# Patient Record
Sex: Female | Born: 1945 | Race: White | Hispanic: No | Marital: Married | Smoking: Never smoker
Health system: Southern US, Community
[De-identification: ages and names within clinical notes are randomized; demographics above are authoritative.]

## PROBLEM LIST (undated history)

## (undated) DIAGNOSIS — K5730 Diverticulosis of large intestine without perforation or abscess without bleeding: Secondary | ICD-10-CM

## (undated) DIAGNOSIS — R159 Full incontinence of feces: Secondary | ICD-10-CM

## (undated) DIAGNOSIS — I10 Essential (primary) hypertension: Secondary | ICD-10-CM

## (undated) DIAGNOSIS — E785 Hyperlipidemia, unspecified: Secondary | ICD-10-CM

## (undated) DIAGNOSIS — Z860101 Personal history of adenomatous and serrated colon polyps: Secondary | ICD-10-CM

## (undated) DIAGNOSIS — Z8719 Personal history of other diseases of the digestive system: Secondary | ICD-10-CM

## (undated) DIAGNOSIS — E119 Type 2 diabetes mellitus without complications: Secondary | ICD-10-CM

## (undated) DIAGNOSIS — M1990 Unspecified osteoarthritis, unspecified site: Secondary | ICD-10-CM

## (undated) DIAGNOSIS — E079 Disorder of thyroid, unspecified: Secondary | ICD-10-CM

## (undated) DIAGNOSIS — T7840XA Allergy, unspecified, initial encounter: Secondary | ICD-10-CM

## (undated) DIAGNOSIS — H269 Unspecified cataract: Secondary | ICD-10-CM

## (undated) DIAGNOSIS — Z9889 Other specified postprocedural states: Secondary | ICD-10-CM

## (undated) DIAGNOSIS — K219 Gastro-esophageal reflux disease without esophagitis: Secondary | ICD-10-CM

## (undated) DIAGNOSIS — Z9109 Other allergy status, other than to drugs and biological substances: Secondary | ICD-10-CM

## (undated) DIAGNOSIS — Z973 Presence of spectacles and contact lenses: Secondary | ICD-10-CM

## (undated) DIAGNOSIS — Z86010 Personal history of colonic polyps: Secondary | ICD-10-CM

## (undated) DIAGNOSIS — M8580 Other specified disorders of bone density and structure, unspecified site: Secondary | ICD-10-CM

## (undated) DIAGNOSIS — H409 Unspecified glaucoma: Secondary | ICD-10-CM

## (undated) HISTORY — DX: Essential (primary) hypertension: I10

## (undated) HISTORY — DX: Allergy, unspecified, initial encounter: T78.40XA

## (undated) HISTORY — PX: COLONOSCOPY W/ POLYPECTOMY: SHX1380

## (undated) HISTORY — DX: Other specified disorders of bone density and structure, unspecified site: M85.80

## (undated) HISTORY — PX: POLYPECTOMY: SHX149

## (undated) HISTORY — DX: Unspecified osteoarthritis, unspecified site: M19.90

## (undated) HISTORY — DX: Personal history of other diseases of the digestive system: Z87.19

## (undated) HISTORY — PX: TONSILLECTOMY AND ADENOIDECTOMY: SUR1326

## (undated) HISTORY — PX: CATARACT EXTRACTION: SUR2

## (undated) HISTORY — DX: Unspecified cataract: H26.9

## (undated) HISTORY — PX: COLONOSCOPY: SHX174

## (undated) HISTORY — DX: Gastro-esophageal reflux disease without esophagitis: K21.9

## (undated) HISTORY — DX: Hyperlipidemia, unspecified: E78.5

## (undated) HISTORY — DX: Disorder of thyroid, unspecified: E07.9

## (undated) HISTORY — PX: ESOPHAGOGASTRODUODENOSCOPY (EGD) WITH ESOPHAGEAL DILATION: SHX5812

## (undated) HISTORY — DX: Other specified postprocedural states: Z98.890

---

## 1974-04-06 HISTORY — PX: TUBAL LIGATION: SHX77

## 1977-04-06 HISTORY — PX: THYROIDECTOMY: SHX17

## 1981-04-06 HISTORY — PX: VAGINAL HYSTERECTOMY: SUR661

## 1983-04-07 HISTORY — PX: BUNIONECTOMY: SHX129

## 1997-04-06 HISTORY — PX: TRIGGER FINGER RELEASE: SHX641

## 1997-08-13 ENCOUNTER — Other Ambulatory Visit: Admission: RE | Admit: 1997-08-13 | Discharge: 1997-08-13 | Payer: Self-pay | Admitting: *Deleted

## 1998-10-14 ENCOUNTER — Other Ambulatory Visit: Admission: RE | Admit: 1998-10-14 | Discharge: 1998-10-14 | Payer: Self-pay | Admitting: *Deleted

## 1999-02-12 ENCOUNTER — Encounter: Payer: Self-pay | Admitting: Internal Medicine

## 1999-11-11 ENCOUNTER — Other Ambulatory Visit: Admission: RE | Admit: 1999-11-11 | Discharge: 1999-11-11 | Payer: Self-pay | Admitting: *Deleted

## 2000-12-07 ENCOUNTER — Other Ambulatory Visit: Admission: RE | Admit: 2000-12-07 | Discharge: 2000-12-07 | Payer: Self-pay | Admitting: *Deleted

## 2000-12-27 ENCOUNTER — Ambulatory Visit (HOSPITAL_COMMUNITY): Admission: RE | Admit: 2000-12-27 | Discharge: 2000-12-27 | Payer: Self-pay | Admitting: *Deleted

## 2000-12-27 ENCOUNTER — Encounter: Payer: Self-pay | Admitting: *Deleted

## 2001-12-06 ENCOUNTER — Other Ambulatory Visit: Admission: RE | Admit: 2001-12-06 | Discharge: 2001-12-06 | Payer: Self-pay | Admitting: *Deleted

## 2002-01-03 ENCOUNTER — Encounter: Payer: Self-pay | Admitting: *Deleted

## 2002-01-03 ENCOUNTER — Ambulatory Visit (HOSPITAL_COMMUNITY): Admission: RE | Admit: 2002-01-03 | Discharge: 2002-01-03 | Payer: Self-pay | Admitting: *Deleted

## 2002-11-15 ENCOUNTER — Other Ambulatory Visit: Admission: RE | Admit: 2002-11-15 | Discharge: 2002-11-15 | Payer: Self-pay | Admitting: *Deleted

## 2003-01-08 ENCOUNTER — Encounter: Payer: Self-pay | Admitting: *Deleted

## 2003-01-08 ENCOUNTER — Ambulatory Visit (HOSPITAL_COMMUNITY): Admission: RE | Admit: 2003-01-08 | Discharge: 2003-01-08 | Payer: Self-pay | Admitting: *Deleted

## 2003-11-29 ENCOUNTER — Other Ambulatory Visit: Admission: RE | Admit: 2003-11-29 | Discharge: 2003-11-29 | Payer: Self-pay | Admitting: *Deleted

## 2004-01-08 ENCOUNTER — Encounter: Payer: Self-pay | Admitting: Internal Medicine

## 2004-01-18 ENCOUNTER — Ambulatory Visit (HOSPITAL_COMMUNITY): Admission: RE | Admit: 2004-01-18 | Discharge: 2004-01-18 | Payer: Self-pay | Admitting: *Deleted

## 2004-04-04 ENCOUNTER — Ambulatory Visit: Payer: Self-pay | Admitting: Internal Medicine

## 2004-09-09 ENCOUNTER — Ambulatory Visit: Payer: Self-pay | Admitting: Internal Medicine

## 2004-10-14 ENCOUNTER — Ambulatory Visit: Payer: Self-pay | Admitting: Internal Medicine

## 2004-11-13 ENCOUNTER — Ambulatory Visit: Payer: Self-pay | Admitting: Gastroenterology

## 2004-11-25 ENCOUNTER — Ambulatory Visit: Payer: Self-pay | Admitting: Gastroenterology

## 2004-12-01 ENCOUNTER — Ambulatory Visit: Payer: Self-pay | Admitting: Internal Medicine

## 2004-12-16 ENCOUNTER — Ambulatory Visit: Payer: Self-pay | Admitting: Internal Medicine

## 2004-12-16 ENCOUNTER — Other Ambulatory Visit: Admission: RE | Admit: 2004-12-16 | Discharge: 2004-12-16 | Payer: Self-pay | Admitting: *Deleted

## 2005-01-19 ENCOUNTER — Ambulatory Visit (HOSPITAL_COMMUNITY): Admission: RE | Admit: 2005-01-19 | Discharge: 2005-01-19 | Payer: Self-pay | Admitting: *Deleted

## 2005-01-29 ENCOUNTER — Encounter: Admission: RE | Admit: 2005-01-29 | Discharge: 2005-01-29 | Payer: Self-pay | Admitting: *Deleted

## 2005-02-09 ENCOUNTER — Ambulatory Visit: Payer: Self-pay | Admitting: Internal Medicine

## 2005-02-13 ENCOUNTER — Encounter: Payer: Self-pay | Admitting: Internal Medicine

## 2005-02-13 ENCOUNTER — Ambulatory Visit: Payer: Self-pay

## 2005-02-13 HISTORY — PX: CARDIOVASCULAR STRESS TEST: SHX262

## 2005-03-09 ENCOUNTER — Ambulatory Visit: Payer: Self-pay | Admitting: Internal Medicine

## 2005-03-10 ENCOUNTER — Encounter: Payer: Self-pay | Admitting: Internal Medicine

## 2005-03-23 ENCOUNTER — Ambulatory Visit: Payer: Self-pay

## 2005-04-13 ENCOUNTER — Ambulatory Visit: Payer: Self-pay | Admitting: Internal Medicine

## 2005-04-16 ENCOUNTER — Ambulatory Visit: Payer: Self-pay | Admitting: Cardiology

## 2005-05-19 ENCOUNTER — Ambulatory Visit: Payer: Self-pay | Admitting: Cardiology

## 2005-06-24 ENCOUNTER — Ambulatory Visit: Payer: Self-pay | Admitting: Internal Medicine

## 2005-06-29 ENCOUNTER — Ambulatory Visit: Payer: Self-pay | Admitting: Cardiology

## 2005-07-01 ENCOUNTER — Encounter: Admission: RE | Admit: 2005-07-01 | Discharge: 2005-07-01 | Payer: Self-pay | Admitting: Neurology

## 2005-07-08 ENCOUNTER — Ambulatory Visit (HOSPITAL_COMMUNITY): Admission: RE | Admit: 2005-07-08 | Discharge: 2005-07-08 | Payer: Self-pay | Admitting: Interventional Radiology

## 2005-07-13 ENCOUNTER — Ambulatory Visit: Payer: Self-pay | Admitting: Internal Medicine

## 2005-08-10 ENCOUNTER — Ambulatory Visit: Payer: Self-pay | Admitting: Internal Medicine

## 2005-08-27 ENCOUNTER — Ambulatory Visit: Payer: Self-pay | Admitting: Cardiology

## 2005-11-11 ENCOUNTER — Ambulatory Visit: Payer: Self-pay | Admitting: Internal Medicine

## 2005-12-24 ENCOUNTER — Other Ambulatory Visit: Admission: RE | Admit: 2005-12-24 | Discharge: 2005-12-24 | Payer: Self-pay | Admitting: *Deleted

## 2006-01-26 ENCOUNTER — Ambulatory Visit (HOSPITAL_COMMUNITY): Admission: RE | Admit: 2006-01-26 | Discharge: 2006-01-26 | Payer: Self-pay | Admitting: *Deleted

## 2006-04-06 HISTORY — PX: LAPAROSCOPIC NISSEN FUNDOPLICATION: SHX1932

## 2006-04-06 HISTORY — PX: RETINAL DETACHMENT SURGERY: SHX105

## 2006-04-06 LAB — CONVERTED CEMR LAB

## 2006-07-06 ENCOUNTER — Ambulatory Visit: Payer: Self-pay | Admitting: Internal Medicine

## 2006-07-06 LAB — CONVERTED CEMR LAB
ALT: 21 units/L (ref 0–40)
AST: 23 units/L (ref 0–37)
Albumin: 3.5 g/dL (ref 3.5–5.2)
Alkaline Phosphatase: 39 units/L (ref 39–117)
BUN: 13 mg/dL (ref 6–23)
Basophils Absolute: 0 10*3/uL (ref 0.0–0.1)
Basophils Relative: 0.5 % (ref 0.0–1.0)
Bilirubin, Direct: 0.1 mg/dL (ref 0.0–0.3)
CO2: 33 meq/L — ABNORMAL HIGH (ref 19–32)
Calcium: 9.3 mg/dL (ref 8.4–10.5)
Chloride: 97 meq/L (ref 96–112)
Cholesterol: 204 mg/dL (ref 0–200)
Creatinine, Ser: 0.7 mg/dL (ref 0.4–1.2)
Direct LDL: 89 mg/dL
Eosinophils Absolute: 0.1 10*3/uL (ref 0.0–0.6)
Eosinophils Relative: 1.7 % (ref 0.0–5.0)
GFR calc Af Amer: 109 mL/min
GFR calc non Af Amer: 90 mL/min
Glucose, Bld: 111 mg/dL — ABNORMAL HIGH (ref 70–99)
HCT: 37.7 % (ref 36.0–46.0)
HDL: 51.8 mg/dL (ref >39.0)
Hemoglobin: 13.4 g/dL (ref 12.0–15.0)
Lymphocytes Relative: 34.3 % (ref 12.0–46.0)
MCHC: 35.6 g/dL (ref 30.0–36.0)
MCV: 87 fL (ref 78.0–100.0)
Monocytes Absolute: 0.5 10*3/uL (ref 0.2–0.7)
Monocytes Relative: 7.9 % (ref 3.0–11.0)
Neutro Abs: 3.6 10*3/uL (ref 1.4–7.7)
Neutrophils Relative %: 55.6 % (ref 43.0–77.0)
Platelets: 350 10*3/uL (ref 150–400)
Potassium: 4.2 meq/L (ref 3.5–5.1)
RBC: 4.33 M/uL (ref 3.87–5.11)
RDW: 12 % (ref 11.5–14.6)
Sodium: 138 meq/L (ref 135–145)
TSH: 4.79 microintl units/mL (ref 0.35–5.50)
Total Bilirubin: 0.5 mg/dL (ref 0.3–1.2)
Total CHOL/HDL Ratio: 3.9
Total Protein: 6.8 g/dL (ref 6.0–8.3)
Triglycerides: 412 mg/dL (ref 0–149)
VLDL: 82 mg/dL — ABNORMAL HIGH (ref 0–40)
WBC: 6.4 10*3/uL (ref 4.5–10.5)

## 2006-07-14 ENCOUNTER — Ambulatory Visit: Payer: Self-pay | Admitting: Internal Medicine

## 2006-08-05 ENCOUNTER — Ambulatory Visit (HOSPITAL_BASED_OUTPATIENT_CLINIC_OR_DEPARTMENT_OTHER): Admission: RE | Admit: 2006-08-05 | Discharge: 2006-08-05 | Payer: Self-pay | Admitting: Orthopedic Surgery

## 2006-08-05 HISTORY — PX: CARPAL TUNNEL RELEASE: SHX101

## 2006-08-11 ENCOUNTER — Ambulatory Visit: Payer: Self-pay | Admitting: Family Medicine

## 2006-10-12 ENCOUNTER — Ambulatory Visit: Payer: Self-pay | Admitting: Internal Medicine

## 2006-10-12 LAB — CONVERTED CEMR LAB
BUN: 9 mg/dL (ref 6–23)
CO2: 33 meq/L — ABNORMAL HIGH (ref 19–32)
Calcium: 9.3 mg/dL (ref 8.4–10.5)
Chloride: 106 meq/L (ref 96–112)
Cholesterol: 211 mg/dL (ref 0–200)
Creatinine, Ser: 0.6 mg/dL (ref 0.4–1.2)
Direct LDL: 133.6 mg/dL
GFR calc Af Amer: 131 mL/min
GFR calc non Af Amer: 108 mL/min
Glucose, Bld: 97 mg/dL (ref 70–99)
HDL: 66.1 mg/dL (ref >39.0)
Hgb A1c MFr Bld: 6.3 % — ABNORMAL HIGH (ref 4.6–6.0)
Potassium: 4.1 meq/L (ref 3.5–5.1)
Sodium: 143 meq/L (ref 135–145)
Total CHOL/HDL Ratio: 3.2
Triglycerides: 136 mg/dL (ref 0–149)
VLDL: 27 mg/dL (ref 0–40)

## 2006-10-15 ENCOUNTER — Ambulatory Visit: Payer: Self-pay | Admitting: Internal Medicine

## 2006-10-19 DIAGNOSIS — E785 Hyperlipidemia, unspecified: Secondary | ICD-10-CM | POA: Insufficient documentation

## 2006-10-19 DIAGNOSIS — I10 Essential (primary) hypertension: Secondary | ICD-10-CM | POA: Insufficient documentation

## 2006-10-19 DIAGNOSIS — E119 Type 2 diabetes mellitus without complications: Secondary | ICD-10-CM

## 2007-01-05 LAB — CONVERTED CEMR LAB: Pap Smear: NORMAL

## 2007-01-11 ENCOUNTER — Other Ambulatory Visit: Admission: RE | Admit: 2007-01-11 | Discharge: 2007-01-11 | Payer: Self-pay | Admitting: *Deleted

## 2007-01-28 ENCOUNTER — Ambulatory Visit (HOSPITAL_COMMUNITY): Admission: RE | Admit: 2007-01-28 | Discharge: 2007-01-28 | Payer: Self-pay | Admitting: *Deleted

## 2007-03-11 ENCOUNTER — Encounter: Payer: Self-pay | Admitting: Internal Medicine

## 2007-03-11 ENCOUNTER — Ambulatory Visit: Payer: Self-pay | Admitting: Internal Medicine

## 2007-03-22 ENCOUNTER — Telehealth: Payer: Self-pay | Admitting: Internal Medicine

## 2007-03-25 ENCOUNTER — Ambulatory Visit: Payer: Self-pay

## 2007-03-25 ENCOUNTER — Encounter: Payer: Self-pay | Admitting: Internal Medicine

## 2007-04-11 ENCOUNTER — Ambulatory Visit: Payer: Self-pay | Admitting: Internal Medicine

## 2007-04-11 LAB — CONVERTED CEMR LAB
Glucose, Bld: 109 mg/dL — ABNORMAL HIGH (ref 70–99)
Hgb A1c MFr Bld: 6.1 % — ABNORMAL HIGH (ref 4.6–6.0)

## 2007-04-22 ENCOUNTER — Ambulatory Visit: Payer: Self-pay | Admitting: Internal Medicine

## 2007-04-22 DIAGNOSIS — M8580 Other specified disorders of bone density and structure, unspecified site: Secondary | ICD-10-CM

## 2007-05-30 ENCOUNTER — Telehealth: Payer: Self-pay | Admitting: Internal Medicine

## 2007-08-04 ENCOUNTER — Telehealth (INDEPENDENT_AMBULATORY_CARE_PROVIDER_SITE_OTHER): Payer: Self-pay | Admitting: *Deleted

## 2007-08-10 ENCOUNTER — Encounter: Payer: Self-pay | Admitting: Internal Medicine

## 2007-08-17 ENCOUNTER — Ambulatory Visit: Payer: Self-pay | Admitting: Internal Medicine

## 2007-09-20 ENCOUNTER — Ambulatory Visit: Payer: Self-pay | Admitting: Internal Medicine

## 2007-09-21 ENCOUNTER — Encounter: Payer: Self-pay | Admitting: Internal Medicine

## 2007-09-21 LAB — CONVERTED CEMR LAB
Trich, Wet Prep: NONE SEEN
Yeast Wet Prep HPF POC: NONE SEEN

## 2007-10-21 ENCOUNTER — Ambulatory Visit: Payer: Self-pay | Admitting: Internal Medicine

## 2007-10-21 LAB — CONVERTED CEMR LAB
AST: 19 units/L (ref 0–37)
Alkaline Phosphatase: 35 units/L — ABNORMAL LOW (ref 39–117)
Chloride: 102 meq/L (ref 96–112)
GFR calc non Af Amer: 90 mL/min
Hgb A1c MFr Bld: 6.3 % — ABNORMAL HIGH (ref 4.6–6.0)
LDL Cholesterol: 81 mg/dL (ref 0–99)
Potassium: 3.8 meq/L (ref 3.5–5.1)
Total Bilirubin: 0.6 mg/dL (ref 0.3–1.2)
Total CHOL/HDL Ratio: 3.5

## 2007-11-04 ENCOUNTER — Ambulatory Visit: Payer: Self-pay | Admitting: Internal Medicine

## 2007-11-22 ENCOUNTER — Ambulatory Visit (HOSPITAL_BASED_OUTPATIENT_CLINIC_OR_DEPARTMENT_OTHER): Admission: RE | Admit: 2007-11-22 | Discharge: 2007-11-22 | Payer: Self-pay | Admitting: Family Medicine

## 2007-11-23 ENCOUNTER — Inpatient Hospital Stay (HOSPITAL_COMMUNITY): Admission: EM | Admit: 2007-11-23 | Discharge: 2007-11-25 | Payer: Self-pay | Admitting: Emergency Medicine

## 2007-11-23 ENCOUNTER — Ambulatory Visit: Payer: Self-pay | Admitting: Internal Medicine

## 2007-11-26 ENCOUNTER — Telehealth: Payer: Self-pay | Admitting: Family Medicine

## 2007-12-01 ENCOUNTER — Ambulatory Visit: Payer: Self-pay | Admitting: Internal Medicine

## 2007-12-06 ENCOUNTER — Telehealth: Payer: Self-pay | Admitting: Internal Medicine

## 2008-02-14 ENCOUNTER — Ambulatory Visit (HOSPITAL_COMMUNITY): Admission: RE | Admit: 2008-02-14 | Discharge: 2008-02-14 | Payer: Self-pay | Admitting: Internal Medicine

## 2008-04-13 ENCOUNTER — Ambulatory Visit: Payer: Self-pay | Admitting: Internal Medicine

## 2008-04-16 LAB — CONVERTED CEMR LAB
AST: 36 units/L (ref 0–37)
Calcium: 9.7 mg/dL (ref 8.4–10.5)
Creatinine, Ser: 0.6 mg/dL (ref 0.4–1.2)
GFR calc Af Amer: 130 mL/min
Glucose, Bld: 141 mg/dL — ABNORMAL HIGH (ref 70–99)
HDL: 51.1 mg/dL (ref >39.0)
Sodium: 144 meq/L (ref 135–145)
Total CHOL/HDL Ratio: 4.3
Triglycerides: 336 mg/dL (ref 0–149)

## 2008-10-16 ENCOUNTER — Ambulatory Visit: Payer: Self-pay | Admitting: Internal Medicine

## 2009-02-14 ENCOUNTER — Ambulatory Visit (HOSPITAL_COMMUNITY): Admission: RE | Admit: 2009-02-14 | Discharge: 2009-02-14 | Payer: Self-pay | Admitting: Internal Medicine

## 2009-04-18 ENCOUNTER — Ambulatory Visit: Payer: Self-pay | Admitting: Internal Medicine

## 2009-04-19 LAB — CONVERTED CEMR LAB
AST: 24 units/L (ref 0–37)
CO2: 30 meq/L (ref 19–32)
Chloride: 98 meq/L (ref 96–112)
Creatinine, Ser: 0.6 mg/dL (ref 0.4–1.2)
Direct LDL: 95.5 mg/dL
Hgb A1c MFr Bld: 6.2 % (ref 4.6–6.5)
Potassium: 3.5 meq/L (ref 3.5–5.1)
Sodium: 136 meq/L (ref 135–145)
TSH: 2.79 microintl units/mL (ref 0.35–5.50)
Total Bilirubin: 0.6 mg/dL (ref 0.3–1.2)
Total CHOL/HDL Ratio: 4
Triglycerides: 209 mg/dL — ABNORMAL HIGH (ref 0.0–149.0)
VLDL: 41.8 mg/dL — ABNORMAL HIGH (ref 0.0–40.0)

## 2009-07-02 ENCOUNTER — Encounter: Payer: Self-pay | Admitting: Internal Medicine

## 2009-07-02 ENCOUNTER — Ambulatory Visit: Payer: Self-pay | Admitting: Internal Medicine

## 2009-07-02 LAB — HM DEXA SCAN

## 2009-11-06 ENCOUNTER — Encounter (INDEPENDENT_AMBULATORY_CARE_PROVIDER_SITE_OTHER): Payer: Self-pay | Admitting: *Deleted

## 2009-12-17 ENCOUNTER — Ambulatory Visit: Payer: Self-pay | Admitting: Internal Medicine

## 2009-12-18 LAB — CONVERTED CEMR LAB
Chloride: 99 meq/L (ref 96–112)
Cholesterol: 182 mg/dL (ref 0–200)
GFR calc non Af Amer: 104.61 mL/min (ref >60)
Hgb A1c MFr Bld: 6.7 % — ABNORMAL HIGH (ref 4.6–6.5)
Potassium: 4.3 meq/L (ref 3.5–5.1)
Sodium: 142 meq/L (ref 135–145)
Total CHOL/HDL Ratio: 4
Triglycerides: 255 mg/dL — ABNORMAL HIGH (ref 0.0–149.0)
VLDL: 51 mg/dL — ABNORMAL HIGH (ref 0.0–40.0)

## 2009-12-30 ENCOUNTER — Encounter: Payer: Self-pay | Admitting: Gastroenterology

## 2010-01-01 ENCOUNTER — Telehealth: Payer: Self-pay | Admitting: Gastroenterology

## 2010-01-29 ENCOUNTER — Encounter (INDEPENDENT_AMBULATORY_CARE_PROVIDER_SITE_OTHER): Payer: Self-pay | Admitting: *Deleted

## 2010-01-30 ENCOUNTER — Ambulatory Visit: Payer: Self-pay | Admitting: Gastroenterology

## 2010-02-13 ENCOUNTER — Ambulatory Visit: Payer: Self-pay | Admitting: Gastroenterology

## 2010-02-23 ENCOUNTER — Encounter: Payer: Self-pay | Admitting: Gastroenterology

## 2010-03-06 ENCOUNTER — Ambulatory Visit (HOSPITAL_COMMUNITY): Admission: RE | Admit: 2010-03-06 | Discharge: 2010-03-06 | Payer: Self-pay | Admitting: Internal Medicine

## 2010-04-02 ENCOUNTER — Ambulatory Visit
Admission: RE | Admit: 2010-04-02 | Discharge: 2010-04-02 | Payer: Self-pay | Source: Home / Self Care | Attending: Family Medicine | Admitting: Family Medicine

## 2010-04-02 DIAGNOSIS — M79609 Pain in unspecified limb: Secondary | ICD-10-CM

## 2010-04-09 ENCOUNTER — Encounter
Admission: RE | Admit: 2010-04-09 | Discharge: 2010-04-09 | Payer: Self-pay | Source: Home / Self Care | Attending: Family Medicine | Admitting: Family Medicine

## 2010-04-10 DIAGNOSIS — M542 Cervicalgia: Secondary | ICD-10-CM | POA: Insufficient documentation

## 2010-04-11 ENCOUNTER — Telehealth: Payer: Self-pay | Admitting: Family Medicine

## 2010-04-28 ENCOUNTER — Ambulatory Visit
Admission: RE | Admit: 2010-04-28 | Discharge: 2010-04-28 | Payer: Self-pay | Source: Home / Self Care | Attending: Internal Medicine | Admitting: Internal Medicine

## 2010-05-08 NOTE — Progress Notes (Signed)
Summary: Hold PT for now, OV to discuss MRI requested  Phone Note Call from Patient Call back at Home Phone [phone]   Caller: Patient Call For: Dr. Caryl Never Summary of Call: Pt is requesting a call from Harriett Sine re: your conversation yesterday. Initial call taken by: Lynann Beaver CMA AAMA,  April 11, 2010 9:31 AM  Follow-up for Phone Call        Pt has changed her mind regarding Physical Therapy and would like OV with Dr Cato Mulligan to discuss her MRI.  Also, if he wants her to have PT, she no longer needs it to be done in [facility].  Pt requesting OV with Dr Cato Mulligan be scheduled.  I will inform [facility] Physical Therapy referral be put on hold. Follow-up by: Sid Falcon [name],  April 11, 2010 9:52 AM  Additional Follow-up for Phone Call Additional follow up Details #1::        Pt scheduled with Dr Cato Mulligan 1/23.  Terri will hold PT order Additional Follow-up by: Sid Falcon [name],  April 11, 2010 10:53 AM

## 2010-05-08 NOTE — Letter (Signed)
Summary: Pre Visit Letter Revised  [facility]  [address] [place], [place] [address]   Phone: [phone]  Fax: [phone]        12/30/2009 MRN: [id number] [name] [address] [name], [place]  [address]             Procedure Date:  11-10 at 9:30am   Welcome to the Gastroenterology Division at [facility].    You are scheduled to see a nurse for your pre-procedure visit on 01-30-10 at 10:30am on the 3rd floor at [facility], [address]  We ask that you try to arrive at our office 15 minutes prior to your appointment time to allow for check-in.  Please take a minute to review the attached form.  If you answer "Yes" to one or more of the questions on the first page, we ask that you call the person listed at your earliest opportunity.  If you answer "No" to all of the questions, please complete the rest of the form and bring it to your appointment.    Your nurse visit will consist of discussing your medical and surgical history, your immediate family medical history, and your medications.   If you are unable to list all of your medications on the form, please bring the medication bottles to your appointment and we will list them.  We will need to be aware of both prescribed and over the counter drugs.  We will need to know exact dosage information as well.    Please be prepared to read and sign documents such as consent forms, a financial agreement, and acknowledgement forms.  If necessary, and with your consent, a friend or relative is welcome to sit-in on the nurse visit with you.  Please bring your insurance card so that we may make a copy of it.  If your insurance requires a referral to see a specialist, please bring your referral form from your primary care physician.  No co-pay is required for this nurse visit.     If you cannot keep your appointment, please call [phone] to cancel or reschedule prior to your appointment  date.  This allows [place] the opportunity to schedule an appointment for another patient in need of care.    Thank you for choosing  Gastroenterology for your medical needs.  We appreciate the opportunity to care for you.  Please visit [place] at our website  to learn more about our practice.  Sincerely, The Gastroenterology Division

## 2010-05-08 NOTE — Procedures (Signed)
Summary: Colonoscopy/[facility]  Colonoscopy/ Endoscopy Ctr   Imported By: Sherian Rein 01/06/2010 07:45:49  _____________________________________________________________________  External Attachment:    Type:   Image     Comment:   External Document

## 2010-05-08 NOTE — Assessment & Plan Note (Signed)
Summary: 6 MO ROV/MM   Vital Signs:  Patient profile:   65 year old female Weight:      137 pounds Temp:     97.5 degrees F Pulse rate:   80 / minute Resp:     12 per minute BP sitting:   116 / 68  (left arm)  Vitals Entered By: Gladis Riffle, RN (April 18, 2009 9:48 AM)   History of Present Illness:  Follow-Up Visit      This is a 65 year old woman who presents for Follow-up visit.  The patient denies chest pain, palpitations, dizziness, syncope, edema, SOB, DOE, PND, and orthopnea.  Since the last visit the patient notes no new problems or concerns.  The patient reports taking meds as prescribed and not monitoring BP.  When questioned about possible medication side effects, the patient notes none.    All other systems reviewed and were negative   Preventive Screening-Counseling & Management  Alcohol-Tobacco     Smoking Status: never  Current Problems (verified): 1)  Preventive Health Care  (ICD-V70.0) 2)  Osteopenia  (ICD-733.90) 3)  Hyperglycemia  (ICD-790.29) 4)  Hypertension  (ICD-401.9) 5)  Hyperlipidemia  (ICD-272.4)  Current Medications (verified): 1)  Fosamax 70 Mg Tabs (Alendronate Sodium) .... Take 1 Tablet By Mouth Once A Week 2)  Astelin 137 Mcg/spray  Soln (Azelastine Hcl) .... 2 Sprays Twice Daily 3)  Flonase 50 Mcg/act  Susp (Fluticasone Propionate) .... 2 Sprays Once Daily 4)  Alphagan P 0.1 %  Soln (Brimonidine Tartrate) .... One Drop Each Eye Twice Daily 5)  Diovan Hct 80-12.5 Mg Tabs (Valsartan-Hydrochlorothiazide) .... Take 1 Tab Daily or As Directed 6)  Aspirin 81 Mg  Tbec (Aspirin) .... Once Daily 7)  Multivitamin/iron   Tabs (Multiple Vitamins-Iron) .... Once Daily 8)  D 1000 1000 Unit  Caps (Cholecalciferol) .... Once Daily 9)  Calcium 1200 1200-1000 Mg-Unit  Chew (Calcium Carbonate-Vit D-Min) .... Once Daily 10)  Levonbubolol 0.25% Soln .... One Drop Each Eye Once Daily 11)  Claritin 10 Mg Tabs (Loratadine) .... Once Daily 12)  Align  Caps (Misc  Intestinal Flora Regulat) .... Once Daily 13)  Premarin 0.45 Mg Tabs (Estrogens Conjugated) .... Take 1 Tablet By Mouth Once A Day or As Directed 14)  Restasis 0.05 % Emul (Cyclosporine) .... One Drop Each Eye Two Times A Day  Allergies: 1)  ! Lisinopril  Comments:  Nurse/Medical Assistant: 6 month rov, does not check BP regularly, but is ok when has been checked at home  The patient's medications and allergies were reviewed with the patient and were updated in the Medication and Allergy Lists. Gladis Riffle, RN (April 18, 2009 9:49 AM)  Past History:  Past Medical History: Last updated: 04/22/2007 Allergic rhinitis Hyperlipidemia Hypertension glaucoma increased glucose Osteopenia  Past Surgical History: Last updated: 04/13/2008 Hysterectomy--fibroid Tubal ligation, bilateral 1976 Thyroidectomy, hemi Tonsillectomy, adenoidectomy bilateral  bunionectomy       1985 trigger finger release   1999 glaucoma CTS release-2008 detached retina 2008  Family History: Last updated: 11/04/2007 mother-diabetes, CABG, MI, Strokes Family History Diabetes 1st degree relative-mother Father---liver CA  Social History: Last updated: 11/04/2007 Married Regular exercise-no retired (lay off) Never Smoked Alcohol use-no  Risk Factors: Exercise: no (04/22/2007)  Risk Factors: Smoking Status: never (04/18/2009)  Physical Exam  General:  Well-developed,well-nourished,in no acute distress; alert,appropriate and cooperative throughout examination Head:  normocephalic and atraumatic.   Eyes:  pupils equal and pupils round.   Ears:  R ear normal  and L ear normal.   Neck:  No deformities, masses, or tenderness noted. Chest Wall:  No deformities, masses, or tenderness noted. Lungs:  normal respiratory effort, no intercostal retractions, no dullness, and no fremitus.   Heart:  regular rhythm and no murmur.   Abdomen:  Bowel sounds positive,abdomen soft and non-tender without masses,  organomegaly or hernias noted. Msk:  No deformity or scoliosis noted of thoracic or lumbar spine.   Neurologic:  cranial nerves II-XII intact and gait normal.     Impression & Recommendations:  Problem # 1:  HYPERTENSION (ICD-401.9) controlled continue current medications  Her updated medication list for this problem includes:    Diovan Hct 80-12.5 Mg Tabs (Valsartan-hydrochlorothiazide) .Marland Kitchen... Take 1 tab daily or as directed  Orders: Venipuncture (40981) TLB-BMP (Basic Metabolic Panel-BMET) (80048-METABOL)  BP today: 116/68 Prior BP: 114/66 (10/16/2008)  Labs Reviewed: K+: 4.1 (04/13/2008) Creat: : 0.6 (04/13/2008)   Chol: 219 (04/13/2008)   HDL: 51.1 (04/13/2008)   LDL: DEL (04/13/2008)   TG: 336 (04/13/2008)  Problem # 2:  HYPERGLYCEMIA (ICD-790.29) check lagx today Labs Reviewed: Creat: 0.6 (04/13/2008)     Orders: Venipuncture (19147) TLB-A1C / Hgb A1C (Glycohemoglobin) (83036-A1C)  Problem # 3:  HYPERLIPIDEMIA (ICD-272.4) check labs today Orders: Venipuncture (82956) TLB-Lipid Panel (80061-LIPID) TLB-Hepatic/Liver Function Pnl (80076-HEPATIC) TLB-TSH (Thyroid Stimulating Hormone) (84443-TSH)  Labs Reviewed: SGOT: 36 (04/13/2008)   SGPT: 37 (04/13/2008)   HDL:51.1 (04/13/2008), 46.4 (10/21/2007)  LDL:DEL (04/13/2008), 81 (21/30/8657)  Chol:219 (04/13/2008), 161 (10/21/2007)  Trig:336 (04/13/2008), 168 (10/21/2007)  Problem # 4:  OSTEOPENIA (ICD-733.90) stop fosomax---reviewed dexa 2008 The following medications were removed from the medication list:    Fosamax 70 Mg Tabs (Alendronate sodium) .Marland Kitchen... Take 1 tablet by mouth once a week  Complete Medication List: 1)  Astelin 137 Mcg/spray Soln (Azelastine hcl) .... 2 sprays twice daily 2)  Flonase 50 Mcg/act Susp (Fluticasone propionate) .... 2 sprays once daily 3)  Alphagan P 0.1 % Soln (Brimonidine tartrate) .... One drop each eye twice daily 4)  Diovan Hct 80-12.5 Mg Tabs (Valsartan-hydrochlorothiazide) ....  Take 1 tab daily or as directed 5)  Aspirin 81 Mg Tbec (Aspirin) .... Once daily 6)  Multivitamin/iron Tabs (Multiple vitamins-iron) .... Once daily 7)  D 1000 1000 Unit Caps (Cholecalciferol) .... Once daily 8)  Calcium 1200 1200-1000 Mg-unit Chew (Calcium carbonate-vit d-min) .... Once daily 9)  Levonbubolol 0.25% Soln  .... One drop each eye once daily 10)  Claritin 10 Mg Tabs (Loratadine) .... Once daily 11)  Align Caps (Misc intestinal flora regulat) .... Once daily 12)  Premarin 0.45 Mg Tabs (Estrogens conjugated) .... Take 1 tablet by mouth once a day or as directed 13)  Restasis 0.05 % Emul (Cyclosporine) .... One drop each eye two times a day  Patient Instructions: 1)  Please schedule a follow-up appointment in 6 months.

## 2010-05-08 NOTE — Procedures (Signed)
Summary: Colonoscopy  Patient: [name] Note: All result statuses are Final unless otherwise noted.  Tests: (1) Colonoscopy (COL)   COL Colonoscopy           DONE     [facility]     [address]     [place], [place]  [address]           COLONOSCOPY PROCEDURE REPORT           PATIENT:  [name], [name]  MR#:  [id number]     BIRTHDATE:  May 13, 1945, 64 yrs. old  GENDER:  female           ENDOSCOPIST:  Barbette Hair. Arlyce Dice, MD     Referred by:           PROCEDURE DATE:  02/13/2010     PROCEDURE:  Colonoscopy with snare polypectomy     ASA CLASS:  Class II     INDICATIONS:  1) Elevated Risk Screening  2) family history of     colon cancer Father           MEDICATIONS:   Fentanyl 50 mcg IV, Versed 6 mg IV           DESCRIPTION OF PROCEDURE:   After the risks benefits and     alternatives of the procedure were thoroughly explained, informed     consent was obtained.  Digital rectal exam was performed and     revealed no abnormalities.   The LB 180AL [id number] endoscope was     introduced through the anus and advanced to the cecum, which was     identified by both the appendix and ileocecal valve, without     limitations.  The quality of the prep was excellent, using     MoviPrep.  The instrument was then slowly withdrawn as the colon     was fully examined.     <<PROCEDUREIMAGES>>           FINDINGS:  A sessile polyp was found in the ascending colon. It     was 4 mm in size. Polyp was snared without cautery. Retrieval was     successful (see image5). snare polyp  Mild diverticulosis was     found in the sigmoid colon (see image12).  Internal hemorrhoids     were found (see image14).  This was otherwise a normal examination     of the colon (see image2, image4, image6, image8, and image13).     Retroflexed views in the rectum revealed internal hemorrhoids.     The time to cecum =  3.0  minutes. The scope was then withdrawn (time     =  7.0  min) from the  patient and the procedure completed.           COMPLICATIONS:  None           ENDOSCOPIC IMPRESSION:     1) 4 mm sessile polyp in the ascending colon     2) Mild diverticulosis in the sigmoid colon     3) Otherwise normal examination     4) Internal hemorrhoids     RECOMMENDATIONS:     1) Given your significant family history of colon cancer, you     should have a repeat colonoscopy in 5 years           REPEAT EXAM:   5 year(s) Colonoscopy           ______________________________  Barbette Hair. Arlyce Dice, MD           CC: Lindley Magnus, MD           n.     Rosalie DoctorBarbette Hair. Kaplan at 02/13/2010 10:05 AM           Tyson Alias, [id number]  Note: An exclamation mark (!) indicates a result that was not dispersed into the flowsheet. Document Creation Date: 02/13/2010 10:06 AM _______________________________________________________________________  (1) Order result status: Final Collection or observation date-time: 02/13/2010 09:59 Requested date-time:  Receipt date-time:  Reported date-time:  Referring Physician:   Ordering Physician: Melvia Heaps [phone]) Specimen Source:  Source: Launa Grill Order Number: [id number] Lab site:   Appended Document: Colonoscopy     Procedures Next Due Date:    Colonoscopy: 02/2015

## 2010-05-08 NOTE — Assessment & Plan Note (Signed)
Summary: left arm pain//ccm   Vital Signs:  Patient profile:   65 year old female Weight:      133 pounds Temp:     97.8 degrees F oral BP sitting:   130 / 74  (left arm) Cuff size:   regular  Vitals Entered By: Sid Falcon [name] (April 02, 2010 2:44 PM)  History of Present Illness: L upper extremity pain for 2 months.  Neck radiating to hand. Achy pain which comes and goes.  Progressive. Worse with direct pressure on arm.  Worse with stretching out arm ? weaker with hand grip.  No injury.   No alleviating factors.  Severity of pain is 4-5/10.   Remote hx of carpal tunnel syndrome on L side. this pain is different. Hx of osteopenia.  Allergies: 1)  ! Lisinopril  Past History:  Past Medical History: Last updated: 04/22/2007 Allergic rhinitis Hyperlipidemia Hypertension glaucoma increased glucose Osteopenia  Past Surgical History: Last updated: 04/13/2008 Hysterectomy--fibroid Tubal ligation, bilateral 1976 Thyroidectomy, hemi Tonsillectomy, adenoidectomy bilateral  bunionectomy       1985 trigger finger release   1999 glaucoma CTS release-2008 detached retina 2008  Family History: Last updated: 11/04/2007 mother-diabetes, CABG, MI, Strokes Family History Diabetes 1st degree relative-mother Father---liver CA  Social History: Last updated: 11/04/2007 Married Regular exercise-no retired (lay off) Never Smoked Alcohol use-no  Risk Factors: Exercise: no (04/22/2007)  Risk Factors: Smoking Status: never (04/18/2009) PMH-FH-SH reviewed for relevance  Review of Systems  The patient denies anorexia, fever, weight loss, hoarseness, chest pain, peripheral edema, headaches, muscle weakness, and enlarged lymph nodes.    Physical Exam  General:  Well-developed,well-nourished,in no acute distress; alert,appropriate and cooperative throughout examination Head:  normocephalic and atraumatic.   Neck:  No deformities, masses, or tenderness noted. Good  ROM. Lungs:  Normal respiratory effort, chest expands symmetrically. Lungs are clear to auscultation, no crackles or wheezes. Heart:  Normal rate and regular rhythm. S1 and S2 normal without gallop, murmur, click, rub or other extra sounds. Extremities:  no atrophy Neurologic:  ?grip weakness L vs R other wise full strenght.sensation intact to light touch and DTRs symmetrical and normal.     Impression & Recommendations:  Problem # 1:  ARM PAIN (ICD-729.5) Assessment New suspect Cervical nerve root impingement.  start with some plain films.  May need MRI to further assess. Orders: T-Cervical Spine Comp 4 Views [phone])  Problem # 2:  OSTEOPENIA (ICD-733.90)  Her updated medication list for this problem includes:    D 1000 1000 Unit Caps (Cholecalciferol) ..... Once daily    Calcium 1200 1200-1000 Mg-unit Chew (Calcium carbonate-vit d-min) ..... Once daily  Complete Medication List: 1)  Alphagan P 0.1 % Soln (Brimonidine tartrate) .... One drop each eye twice daily 2)  Diovan Hct 80-12.5 Mg Tabs (Valsartan-hydrochlorothiazide) .... Take 1 tab daily or as directed 3)  Aspirin 81 Mg Tbec (Aspirin) .... Once daily 4)  Multivitamin/iron Tabs (Multiple vitamins-iron) .... Once daily 5)  D 1000 1000 Unit Caps (Cholecalciferol) .... Once daily 6)  Calcium 1200 1200-1000 Mg-unit Chew (Calcium carbonate-vit d-min) .... Once daily 7)  Levonbubolol 0.25% Soln  .... One drop each eye once daily 8)  Claritin 10 Mg Tabs (Loratadine) .... Once daily 9)  Align Caps (Misc intestinal flora regulat) .... Once daily 10)  Premarin 0.45 Mg Tabs (Estrogens conjugated) .... Take 1 tablet by mouth once a day or as directed 11)  Restasis 0.05 % Emul (Cyclosporine) .... One drop each eye two times a  day 12)  Triamcinolone Acetonide 0.5 % Crea (Triamcinolone acetonide) .... Apply bid to affected area   Orders Added: 1)  T-Cervical Spine Comp 4 Views [72050TC] 2)  Est. Patient Level IV [65784]

## 2010-05-08 NOTE — Miscellaneous (Signed)
Summary: RECALL COLON/YF  Clinical Lists Changes  Medications: Added new medication of MOVIPREP 100 GM  SOLR (PEG-KCL-NACL-NASULF-NA ASC-C) As directed - Signed Rx of MOVIPREP 100 GM  SOLR (PEG-KCL-NACL-NASULF-NA ASC-C) As directed;  #1 x 0;  Signed;  Entered by: Clide Cliff RN;  Authorized by: Louis Meckel MD;  Method used: [facility]  [address] [phone] [address] [place], [place], [place]  [address], Ph: [phone] or [phone], Fax: [phone]    Prescriptions: MOVIPREP 100 GM  SOLR (PEG-KCL-NACL-NASULF-NA ASC-C) As directed  #1 x 0   Entered by:   Clide Cliff RN   Authorized by:   Louis Meckel MD   Signed by:   Clide Cliff RN on 01/30/2010   Method used:   [facility]ically to        [facility]  [address] [phone] (retail)       [address]       [place], [place]  [address]       Ph: [phone] or [phone]       Fax: [phone]   RxID:   [id number]

## 2010-05-08 NOTE — Progress Notes (Signed)
Summary: Direct referral form answered yes to 2 questions   Phone Note Call from Patient Call back at Home Phone [phone]   Caller: Patient Call For: Dr Arlyce Dice Reason for Call: Talk to Nurse Summary of Call: Patient has questions regarding forms she received before her previsit. Initial call taken by: Tawni Levy,  January 01, 2010 12:02 PM  Follow-up for Phone Call        Dr Arlyce Dice, Pt answered yes to 2 questions on the direct referral form that we are mailing out to the pt. She had a colonoscopy in 2006. by you, I have the report it needs to be scanned in, she had diverticulosis and internal hemorrhoids on her report.  She also answered yes to sedation problems but when I asked her about that she just stated she was just easy to sedate. Can pt continue with her Prevsit and colonoscopy? Or has her recall changed? Let me know and I will contact the pt to let know.  Follow-up by: Merri Ray CMA Duncan Dull),  January 01, 2010 12:15 PM  Additional Follow-up for Phone Call Additional follow up Details #1::        needs colo Additional Follow-up by: Louis Meckel MD,  January 01, 2010 2:31 PM    Additional Follow-up for Phone Call Additional follow up Details #2::    Dr Arlyce Dice reviewed report, wants pt to continue with Previsit and scheduled colonoscopy Called pt to inform Follow-up by: Merri Ray CMA Duncan Dull),  January 01, 2010 2:52 PM

## 2010-05-08 NOTE — Letter (Signed)
Summary: Colonoscopy Letter  [facility]  [address] [place], [place] [address]   Phone: [phone]  Fax: [phone]      November 06, 2009 MRN: [id number]   [name] [address] [place], [place]  [address]   Dear [name],   According to your medical record, it is time for you to schedule a Colonoscopy. The American Cancer Society recommends this procedure as a method to detect early colon cancer. Patients with a family history of colon cancer, or a personal history of colon polyps or inflammatory bowel disease are at increased risk.  This letter has been generated based on the recommendations made at the time of your procedure. If you feel that in your particular situation this may no longer apply, please contact our office.  Please call our office at [phone] to schedule this appointment or to update your records at your earliest convenience.  Thank you for cooperating with [place] to provide you with the very best care possible.   Sincerely,   Barbette Hair. Arlyce Dice, M.D.  [facility] [phone]

## 2010-05-08 NOTE — Letter (Signed)
Summary: Patient Notice- Polyp Results  [facility]  [address] [place], [place] [address]   Phone: [phone]  Fax: [phone]        February 23, 2010 MRN: [id number]    [name] [address] [place], [place]  [address]    Dear Ms. [name],  I am pleased to inform you that the colon polyp(s) removed during your recent colonoscopy was (were) found to be benign (no cancer detected) upon pathologic examination.  I recommend you have a repeat colonoscopy examination in 5_ years to look for recurrent polyps, as having colon polyps increases your risk for having recurrent polyps or even colon cancer in the future.  Should you develop new or worsening symptoms of abdominal pain, bowel habit changes or bleeding from the rectum or bowels, please schedule an evaluation with either your primary care physician or with me.  Additional information/recommendations:  __ No further action with gastroenterology is needed at this time. Please      follow-up with your primary care physician for your other healthcare      needs.  __ Please call [phone] to schedule a return visit to review your      situation.  __ Please keep your follow-up visit as already scheduled.  _x_ Continue treatment plan as outlined the day of your exam.  Please call us if you are having persistent problems or have questions about your condition that have not been fully answered at this time.  Sincerely,  Louis Meckel MD  This letter has been electronically signed by your physician.  Appended Document: Patient Notice- Polyp Results Letter mailed

## 2010-05-08 NOTE — Letter (Signed)
Summary: [facility] Instructions  [facility]  [address] [place], [place] [address]   Phone: [phone]  Fax: [phone]       [name]    Apr 20, 1945    MRN: [id number]        Procedure Day Dorna Bloom:  Lenor Coffin  02/13/10     Arrival Time:  8:30AM     Procedure Time:  9:30AM     Location of Procedure:                    Juliann Pares _  [facility] (4th Floor)  PREPARATION FOR COLONOSCOPY WITH MOVIPREP   Starting 5 days prior to your procedure 02/08/10 do not eat nuts, seeds, popcorn, corn, beans, peas,  salads, or any raw vegetables.  Do not take any fiber supplements (e.g. Metamucil, Citrucel, and Benefiber).  THE DAY BEFORE YOUR PROCEDURE         DATE: 02/12/10  DAY: WEDNESDAY  1.  Drink clear liquids the entire day-NO SOLID FOOD  2.  Do not drink anything colored red or purple.  Avoid juices with pulp.  No orange juice.  3.  Drink at least 64 oz. (8 glasses) of fluid/clear liquids during the day to prevent dehydration and help the prep work efficiently.  CLEAR LIQUIDS INCLUDE: Water Jello Ice Popsicles Tea (sugar ok, no milk/cream) Powdered fruit flavored drinks Coffee (sugar ok, no milk/cream) Gatorade Juice: apple, white grape, white cranberry  Lemonade Clear bullion, consomm, broth Carbonated beverages (any kind) Strained chicken noodle soup Hard Candy                             4.  In the morning, mix first dose of MoviPrep solution:    Empty 1 Pouch A and 1 Pouch B into the disposable container    Add lukewarm drinking water to the top line of the container. Mix to dissolve    Refrigerate (mixed solution should be used within 24 hrs)  5.  Begin drinking the prep at 5:00 p.m. The MoviPrep container is divided by 4 marks.   Every 15 minutes drink the solution down to the next mark (approximately 8 oz) until the full liter is complete.   6.  Follow completed prep with 16 oz of clear liquid of your choice (Nothing red or  purple).  Continue to drink clear liquids until bedtime.  7.  Before going to bed, mix second dose of MoviPrep solution:    Empty 1 Pouch A and 1 Pouch B into the disposable container    Add lukewarm drinking water to the top line of the container. Mix to dissolve    Refrigerate  THE DAY OF YOUR PROCEDURE      DATE: 02/13/10  DAY: THURSDAY  Beginning at 4:30AM (5 hours before procedure):         1. Every 15 minutes, drink the solution down to the next mark (approx 8 oz) until the full liter is complete.  2. Follow completed prep with 16 oz. of clear liquid of your choice.    3. You may drink clear liquids until 7:30AM (2 HOURS BEFORE PROCEDURE).   MEDICATION INSTRUCTIONS  Unless otherwise instructed, you should take regular prescription medications with a small sip of water   as early as possible the morning of your procedure.   Additional medication instructions:   Hold HCTZ AM of procedure  OTHER INSTRUCTIONS  You will need a responsible adult at least 66 years of age to accompany you and drive you home.   This person must remain in the waiting room during your procedure.  Wear loose fitting clothing that is easily removed.  Leave jewelry and other valuables at home.  However, you may wish to bring a book to read or  an iPod/MP3 player to listen to music as you wait for your procedure to start.  Remove all body piercing jewelry and leave at home.  Total time from sign-in until discharge is approximately 2-3 hours.  You should go home directly after your procedure and rest.  You can resume normal activities the  day after your procedure.  The day of your procedure you should not:   Drive   Make legal decisions   Operate machinery   Drink alcohol   Return to work  You will receive specific instructions about eating, activities and medications before you leave.    The above instructions have been reviewed and explained to me by   Clide Cliff,  RN_______________________    I fully understand and can verbalize these instructions _____________________________ Date _________

## 2010-05-08 NOTE — Miscellaneous (Signed)
Summary: BONE DENSITY  Clinical Lists Changes  Orders: Added new Test order of T-Bone Densitometry (77080) - Signed Added new Test order of T-Lumbar Vertebral Assessment (77082) - Signed 

## 2010-05-08 NOTE — Assessment & Plan Note (Signed)
Summary: 6 month follow up/cjr   Vital Signs:  Patient profile:   65 year old female Height:      62.5 inches Weight:      132 pounds BMI:     23.84 Temp:     98.3 degrees F oral BP sitting:   124 / 78  (left arm) Cuff size:   regular  Vitals Entered By: Kern Reap CMA Duncan Dull) (December 17, 2009 11:48 AM) CC: follow-up visit Is Patient Diabetic? No Pain Assessment Patient in pain? no        CC:  follow-up visit.  History of Present Illness:  Follow-Up Visit      This is a 65 year old woman who presents for Follow-up visit.  The patient denies chest pain and palpitations.  Since the last visit the patient notes no new problems or concerns.  The patient reports taking meds as prescribed.  When questioned about possible medication side effects, the patient notes none.    All other systems reviewed and were negative   Current Medications (verified): 1)  Alphagan P 0.1 %  Soln (Brimonidine Tartrate) .... One Drop Each Eye Twice Daily 2)  Diovan Hct 80-12.5 Mg Tabs (Valsartan-Hydrochlorothiazide) .... Take 1 Tab Daily or As Directed 3)  Aspirin 81 Mg  Tbec (Aspirin) .... Once Daily 4)  Multivitamin/iron   Tabs (Multiple Vitamins-Iron) .... Once Daily 5)  D 1000 1000 Unit  Caps (Cholecalciferol) .... Once Daily 6)  Calcium 1200 1200-1000 Mg-Unit  Chew (Calcium Carbonate-Vit D-Min) .... Once Daily 7)  Levonbubolol 0.25% Soln .... One Drop Each Eye Once Daily 8)  Claritin 10 Mg Tabs (Loratadine) .... Once Daily 9)  Align  Caps (Misc Intestinal Flora Regulat) .... Once Daily 10)  Premarin 0.45 Mg Tabs (Estrogens Conjugated) .... Take 1 Tablet By Mouth Once A Day or As Directed 11)  Restasis 0.05 % Emul (Cyclosporine) .... One Drop Each Eye Two Times A Day  Allergies: 1)  ! Lisinopril  Past History:  Past Medical History: Last updated: 04/22/2007 Allergic rhinitis Hyperlipidemia Hypertension glaucoma increased glucose Osteopenia  Past Surgical History: Last  updated: 04/13/2008 Hysterectomy--fibroid Tubal ligation, bilateral 1976 Thyroidectomy, hemi Tonsillectomy, adenoidectomy bilateral  bunionectomy       1985 trigger finger release   1999 glaucoma CTS release-2008 detached retina 2008  Family History: Last updated: 11/04/2007 mother-diabetes, CABG, MI, Strokes Family History Diabetes 1st degree relative-mother Father---liver CA  Social History: Last updated: 11/04/2007 Married Regular exercise-no retired (lay off) Never Smoked Alcohol use-no  Risk Factors: Exercise: no (04/22/2007)  Risk Factors: Smoking Status: never (04/18/2009)  Physical Exam  General:  alert and well-developed.   Eyes:  pupils equal and pupils round.   Ears:  R ear normal and L ear normal.   Neck:  No deformities, masses, or tenderness noted. Chest Wall:  No deformities, masses, or tenderness noted. Lungs:  normal respiratory effort, no intercostal retractions, no dullness, and no fremitus.   Heart:  regular rhythm and no murmur.     Impression & Recommendations:  Problem # 1:  HYPERGLYCEMIA (ICD-790.29) check labs today Orders: Venipuncture (04540) Specimen Handling (98119) TLB-A1C / Hgb A1C (Glycohemoglobin) (83036-A1C)  Problem # 2:  HYPERTENSION (ICD-401.9) controlled continue current medications  Her updated medication list for this problem includes:    Diovan Hct 80-12.5 Mg Tabs (Valsartan-hydrochlorothiazide) .Marland Kitchen... Take 1 tab daily or as directed  BP today: 124/78 Prior BP: 116/68 (04/18/2009)  Labs Reviewed: K+: 3.5 (04/18/2009) Creat: : 0.6 (04/18/2009)   Chol:  170 (04/18/2009)   HDL: 48.30 (04/18/2009)   LDL: DEL (04/13/2008)   TG: 209.0 (04/18/2009)  Orders: Specimen Handling ([address]) TLB-BMP (Basic Metabolic Panel-BMET) (80048-METABOL)  Problem # 3:  HYPERLIPIDEMIA (ICD-272.4)  no meds required (note TG) Labs Reviewed: SGOT: 24 (04/18/2009)   SGPT: 24 (04/18/2009)   HDL:48.30 (04/18/2009), 51.1 (04/13/2008)   LDL:DEL (04/13/2008), 81 (60/45/4098)  Chol:170 (04/18/2009), 219 (04/13/2008)  Trig:209.0 (04/18/2009), 336 (04/13/2008)  Orders: TLB-Lipid Panel (80061-LIPID) TLB-TSH (Thyroid Stimulating Hormone) (84443-TSH)  Complete Medication List: 1)  Alphagan P 0.1 % Soln (Brimonidine tartrate) .... One drop each eye twice daily 2)  Diovan Hct 80-12.5 Mg Tabs (Valsartan-hydrochlorothiazide) .... Take 1 tab daily or as directed 3)  Aspirin 81 Mg Tbec (Aspirin) .... Once daily 4)  Multivitamin/iron Tabs (Multiple vitamins-iron) .... Once daily 5)  D 1000 1000 Unit Caps (Cholecalciferol) .... Once daily 6)  Calcium 1200 1200-1000 Mg-unit Chew (Calcium carbonate-vit d-min) .... Once daily 7)  Levonbubolol 0.25% Soln  .... One drop each eye once daily 8)  Claritin 10 Mg Tabs (Loratadine) .... Once daily 9)  Align Caps (Misc intestinal flora regulat) .... Once daily 10)  Premarin 0.45 Mg Tabs (Estrogens conjugated) .... Take 1 tablet by mouth once a day or as directed 11)  Restasis 0.05 % Emul (Cyclosporine) .... One drop each eye two times a day 12)  Triamcinolone Acetonide 0.5 % Crea (Triamcinolone acetonide) .... Apply bid to affected area Prescriptions: PREMARIN 0.45 MG TABS (ESTROGENS CONJUGATED) Take 1 tablet by mouth once a day or as directed  #30 x 11   Entered and Authorized by:   Birdie Sons MD   Signed by:   Birdie Sons MD on 12/17/2009   Method used:   Electronically to        [facility]  [address] [phone]* (retail)       [address] [place], [place]  [address]       Ph: [phone] or [phone]       Fax: [phone]   RxID:   [id number] DIOVAN HCT 80-12.5 MG TABS (VALSARTAN-HYDROCHLOROTHIAZIDE) Take 1 tab daily or as directed  #30 x 11   Entered and Authorized by:   Birdie Sons MD   Signed by:   Birdie Sons MD on 12/17/2009   Method used:   Electronically to        [facility]  [address] [phone]* (retail)       [address] [place], [place]   [address]       Ph: [phone] or [phone]       Fax: [phone]   RxID:   [id number] TRIAMCINOLONE ACETONIDE 0.5 % CREA (TRIAMCINOLONE ACETONIDE) apply bid to affected area  #60 grams x 1   Entered and Authorized by:   Birdie Sons MD   Signed by:   Birdie Sons MD on 12/17/2009   Method used:   Electronically to        [facility]  [address] [phone]* (retail)       [address] [place], [place]  [address]       Ph: [phone] or [phone]       Fax: [phone]   RxID:   [id number]

## 2010-05-14 NOTE — Assessment & Plan Note (Signed)
Summary: consult to discuss physical therapy/per nancy/cjr   Vital Signs:  Patient profile:   65 year old female Weight:      134 pounds Temp:     97.6 degrees F oral BP sitting:   146 / 82  (left arm) Cuff size:   regular  Vitals Entered By: Alfred Levins, CMA (April 28, 2010 12:49 PM) CC: f/u on lt arm pain   CC:  f/u on lt arm pain.  History of Present Illness: left arm pain typiclly worse with certain movements or lifting  reviewed previous notes. reviewed MRI. no real indication of radiculopathy. She continues to have pain of the arm. mostly of the left shoulder when she is actively moving it.   no other complaints.  Current Medications (verified): 1)  Alphagan P 0.1 %  Soln (Brimonidine Tartrate) .... One Drop Each Eye Twice Daily 2)  Diovan Hct 80-12.5 Mg Tabs (Valsartan-Hydrochlorothiazide) .... Take 1 Tab Daily or As Directed 3)  Aspirin 81 Mg  Tbec (Aspirin) .... Once Daily 4)  Multivitamin/iron   Tabs (Multiple Vitamins-Iron) .... Once Daily 5)  D 1000 1000 Unit  Caps (Cholecalciferol) .... Once Daily 6)  Calcium 1200 1200-1000 Mg-Unit  Chew (Calcium Carbonate-Vit D-Min) .... Once Daily 7)  Levonbubolol 0.25% Soln .... One Drop Each Eye Once Daily 8)  Claritin 10 Mg Tabs (Loratadine) .... Once Daily 9)  Align  Caps (Misc Intestinal Flora Regulat) .... Once Daily 10)  Premarin 0.45 Mg Tabs (Estrogens Conjugated) .... Take 1 Tablet By Mouth Once A Day or As Directed 11)  Restasis 0.05 % Emul (Cyclosporine) .... One Drop Each Eye Two Times A Day 12)  Triamcinolone Acetonide 0.5 % Crea (Triamcinolone Acetonide) .... Apply Bid To Affected Area  Allergies (verified): 1)  ! Lisinopril  Past History:  Past Medical History: Last updated: 04/22/2007 Allergic rhinitis Hyperlipidemia Hypertension glaucoma increased glucose Osteopenia  Past Surgical History: Last updated: 04/13/2008 Hysterectomy--fibroid Tubal ligation, bilateral 1976 Thyroidectomy,  hemi Tonsillectomy, adenoidectomy bilateral  bunionectomy       1985 trigger finger release   1999 glaucoma CTS release-2008 detached retina 2008  Family History: Last updated: 11/04/2007 mother-diabetes, CABG, MI, Strokes Family History Diabetes 1st degree relative-mother Father---liver CA  Social History: Last updated: 11/04/2007 Married Regular exercise-no retired (lay off) Never Smoked Alcohol use-no  Risk Factors: Exercise: no (04/22/2007)  Risk Factors: Smoking Status: never (04/18/2009)  Physical Exam  General:   well-developed female no acute distress. Neck is supple. range of motion of both shoulders but pain with abduction and flexion of her left shoulder. deep tendon reflexes in the upper  extremities are normal period   Impression & Recommendations:  Problem # 1:  ARM PAIN (ICD-729.5) left I think a mechanical problem...could be related to RTC duration now 3-4 onths trial NSAID if no success than PT/?ortho.  trial NSAID---  Complete Medication List: 1)  Alphagan P 0.1 % Soln (Brimonidine tartrate) .... One drop each eye twice daily 2)  Diovan Hct 80-12.5 Mg Tabs (Valsartan-hydrochlorothiazide) .... Take 1 tab daily or as directed 3)  Aspirin 81 Mg Tbec (Aspirin) .... Once daily 4)  Multivitamin/iron Tabs (Multiple vitamins-iron) .... Once daily 5)  D 1000 1000 Unit Caps (Cholecalciferol) .... Once daily 6)  Calcium 1200 1200-1000 Mg-unit Chew (Calcium carbonate-vit d-min) .... Once daily 7)  Levonbubolol 0.25% Soln  .... One drop each eye once daily 8)  Claritin 10 Mg Tabs (Loratadine) .... Once daily 9)  Align Caps (Misc intestinal flora regulat) .Marland KitchenMarland KitchenMarland Kitchen  Once daily 10)  Premarin 0.45 Mg Tabs (Estrogens conjugated) .... Take 1 tablet by mouth once a day or as directed 11)  Restasis 0.05 % Emul (Cyclosporine) .... One drop each eye two times a day 12)  Triamcinolone Acetonide 0.5 % Crea (Triamcinolone acetonide) .... Apply bid to affected area 13)   Meloxicam 15 Mg Tabs (Meloxicam) .... One by mouth daily with food  Patient Instructions: 1)  call me in 2 weeks if not significantly better Prescriptions: MELOXICAM 15 MG TABS (MELOXICAM) one by mouth daily with food  #30 x 0   Entered and Authorized by:   Birdie Sons MD   Signed by:   Birdie Sons MD on 04/28/2010   Method used:   Electronically to        [facility]  [address] [phone]* (retail)       [address] [place], [place]  [address]       Ph: [phone] or [phone]       Fax: [phone]   RxID:   [id number]    Orders Added: 1)  Est. Patient Level III [34742]

## 2010-07-12 ENCOUNTER — Inpatient Hospital Stay (HOSPITAL_COMMUNITY)
Admission: EM | Admit: 2010-07-12 | Discharge: 2010-07-14 | DRG: 392 | Disposition: A | Discharge status: Home / Self Care | Payer: Medicare Other | Attending: Internal Medicine | Admitting: Internal Medicine

## 2010-07-12 ENCOUNTER — Emergency Department (HOSPITAL_COMMUNITY): Payer: Medicare Other

## 2010-07-12 DIAGNOSIS — K449 Diaphragmatic hernia without obstruction or gangrene: Secondary | ICD-10-CM | POA: Diagnosis present

## 2010-07-12 DIAGNOSIS — H409 Unspecified glaucoma: Secondary | ICD-10-CM | POA: Diagnosis present

## 2010-07-12 DIAGNOSIS — I7789 Other specified disorders of arteries and arterioles: Secondary | ICD-10-CM | POA: Diagnosis present

## 2010-07-12 DIAGNOSIS — I10 Essential (primary) hypertension: Secondary | ICD-10-CM | POA: Diagnosis present

## 2010-07-12 DIAGNOSIS — Z7982 Long term (current) use of aspirin: Secondary | ICD-10-CM

## 2010-07-12 DIAGNOSIS — R0789 Other chest pain: Secondary | ICD-10-CM | POA: Diagnosis present

## 2010-07-12 DIAGNOSIS — K219 Gastro-esophageal reflux disease without esophagitis: Principal | ICD-10-CM | POA: Diagnosis present

## 2010-07-12 DIAGNOSIS — K224 Dyskinesia of esophagus: Secondary | ICD-10-CM | POA: Diagnosis present

## 2010-07-12 LAB — POCT CARDIAC MARKERS
CKMB, poc: <1 ng/mL — ABNORMAL LOW (ref 1.0–8.0)
Myoglobin, poc: 45.5 ng/mL (ref 12–200)
Troponin i, poc: <0.05 ng/mL (ref 0.00–0.09)

## 2010-07-12 LAB — DIFFERENTIAL
Basophils Absolute: 0 10*3/uL (ref 0.0–0.1)
Basophils Relative: 0 % (ref 0–1)
Eosinophils Absolute: 0.2 10*3/uL (ref 0.0–0.7)
Lymphs Abs: 2.5 10*3/uL (ref 0.7–4.0)
Neutrophils Relative %: 59 % (ref 43–77)

## 2010-07-12 LAB — BASIC METABOLIC PANEL
CO2: 28 mEq/L (ref 19–32)
Calcium: 8.9 mg/dL (ref 8.4–10.5)
GFR calc Af Amer: >60 mL/min (ref >60)
GFR calc non Af Amer: >60 mL/min (ref >60)
Glucose, Bld: 152 mg/dL — ABNORMAL HIGH (ref 70–99)
Potassium: 3.5 mEq/L (ref 3.5–5.1)
Sodium: 138 mEq/L (ref 135–145)

## 2010-07-12 LAB — CBC
Platelets: 285 10*3/uL (ref 150–400)
RBC: 4.22 MIL/uL (ref 3.87–5.11)
WBC: 8.1 10*3/uL (ref 4.0–10.5)

## 2010-07-13 ENCOUNTER — Emergency Department (HOSPITAL_COMMUNITY): Payer: Medicare Other

## 2010-07-13 ENCOUNTER — Inpatient Hospital Stay (HOSPITAL_COMMUNITY): Payer: Medicare Other

## 2010-07-13 LAB — CBC
HCT: 34.9 % — ABNORMAL LOW (ref 36.0–46.0)
MCH: 29.5 pg (ref 26.0–34.0)
MCV: 87.9 fL (ref 78.0–100.0)
RBC: 3.97 MIL/uL (ref 3.87–5.11)
WBC: 7.9 10*3/uL (ref 4.0–10.5)

## 2010-07-13 LAB — COMPREHENSIVE METABOLIC PANEL
ALT: 17 U/L (ref 0–35)
AST: 20 U/L (ref 0–37)
Albumin: 3.2 g/dL — ABNORMAL LOW (ref 3.5–5.2)
Alkaline Phosphatase: 50 U/L (ref 39–117)
BUN: 8 mg/dL (ref 6–23)
CO2: 28 mEq/L (ref 19–32)
Calcium: 8.5 mg/dL (ref 8.4–10.5)
Chloride: 107 mEq/L (ref 96–112)
Creatinine, Ser: 0.67 mg/dL (ref 0.4–1.2)
GFR calc Af Amer: >60 mL/min (ref >60)
GFR calc non Af Amer: >60 mL/min (ref >60)
Glucose, Bld: 101 mg/dL — ABNORMAL HIGH (ref 70–99)
Potassium: 4 mEq/L (ref 3.5–5.1)
Sodium: 142 mEq/L (ref 135–145)
Total Bilirubin: 0.3 mg/dL (ref 0.3–1.2)
Total Protein: 5.6 g/dL — ABNORMAL LOW (ref 6.0–8.3)

## 2010-07-13 LAB — LIPID PANEL
Cholesterol: 152 mg/dL (ref 0–200)
HDL: 46 mg/dL (ref >39)
LDL Cholesterol: 57 mg/dL (ref 0–99)
Total CHOL/HDL Ratio: 3.3 RATIO
Triglycerides: 246 mg/dL — ABNORMAL HIGH (ref <150)
VLDL: 49 mg/dL — ABNORMAL HIGH (ref 0–40)

## 2010-07-13 LAB — CK TOTAL AND CKMB (NOT AT ARMC)
CK, MB: 0.9 ng/mL (ref 0.3–4.0)
Relative Index: INVALID (ref 0.0–2.5)
Total CK: 37 U/L (ref 7–177)

## 2010-07-13 LAB — PHOSPHORUS: Phosphorus: 3.7 mg/dL (ref 2.3–4.6)

## 2010-07-13 LAB — CARDIAC PANEL(CRET KIN+CKTOT+MB+TROPI)
CK, MB: 0.8 ng/mL (ref 0.3–4.0)
CK, MB: 0.8 ng/mL (ref 0.3–4.0)
Relative Index: INVALID (ref 0.0–2.5)
Relative Index: INVALID (ref 0.0–2.5)
Total CK: 31 U/L (ref 7–177)
Total CK: 41 U/L (ref 7–177)
Troponin I: 0.01 ng/mL (ref 0.00–0.06)
Troponin I: 0.01 ng/mL (ref 0.00–0.06)

## 2010-07-13 LAB — HEMOGLOBIN A1C
Hgb A1c MFr Bld: 6.3 % — ABNORMAL HIGH (ref <5.7)
Mean Plasma Glucose: 134 mg/dL — ABNORMAL HIGH (ref <117)

## 2010-07-13 LAB — AMYLASE: Amylase: 27 U/L (ref 0–105)

## 2010-07-13 LAB — TROPONIN I: Troponin I: 0.06 ng/mL (ref 0.00–0.06)

## 2010-07-13 LAB — BRAIN NATRIURETIC PEPTIDE: Pro B Natriuretic peptide (BNP): <30 pg/mL (ref 0.0–100.0)

## 2010-07-13 MED ORDER — IOHEXOL 300 MG/ML  SOLN
100.0000 mL | Freq: Once | INTRAMUSCULAR | Status: AC | PRN
  Administered 2010-07-13: 100 mL via INTRAVENOUS

## 2010-07-14 ENCOUNTER — Inpatient Hospital Stay (HOSPITAL_COMMUNITY): Payer: Medicare Other

## 2010-07-14 LAB — CBC
HCT: 30.6 % — ABNORMAL LOW (ref 36.0–46.0)
Hemoglobin: 10.3 g/dL — ABNORMAL LOW (ref 12.0–15.0)
MCV: 87.9 fL (ref 78.0–100.0)
Platelets: 239 10*3/uL (ref 150–400)
RBC: 3.48 MIL/uL — ABNORMAL LOW (ref 3.87–5.11)
WBC: 5.8 10*3/uL (ref 4.0–10.5)

## 2010-07-14 LAB — PHOSPHORUS: Phosphorus: 3.2 mg/dL (ref 2.3–4.6)

## 2010-07-14 LAB — COMPREHENSIVE METABOLIC PANEL
Albumin: 2.8 g/dL — ABNORMAL LOW (ref 3.5–5.2)
Alkaline Phosphatase: 43 U/L (ref 39–117)
BUN: 9 mg/dL (ref 6–23)
CO2: 27 mEq/L (ref 19–32)
Chloride: 108 mEq/L (ref 96–112)
Potassium: 3.4 mEq/L — ABNORMAL LOW (ref 3.5–5.1)
Total Bilirubin: 0.5 mg/dL (ref 0.3–1.2)

## 2010-07-16 ENCOUNTER — Ambulatory Visit (INDEPENDENT_AMBULATORY_CARE_PROVIDER_SITE_OTHER): Payer: Medicare Other | Admitting: Internal Medicine

## 2010-07-16 ENCOUNTER — Encounter: Payer: Self-pay | Admitting: Internal Medicine

## 2010-07-16 VITALS — BP 132/76 | HR 68 | Temp 97.8°F | Ht 63.0 in | Wt 139.0 lb

## 2010-07-16 DIAGNOSIS — I10 Essential (primary) hypertension: Secondary | ICD-10-CM

## 2010-07-16 DIAGNOSIS — K222 Esophageal obstruction: Secondary | ICD-10-CM

## 2010-07-16 MED ORDER — PANTOPRAZOLE SODIUM 40 MG PO TBEC: 40.0000 mg | DELAYED_RELEASE_TABLET | Freq: Every day | ORAL | Status: DC

## 2010-07-16 NOTE — Progress Notes (Signed)
  Subjective:    Patient ID: [name], female    DOB: [birth year], 65 y.o.   MRN: [id number]  HPI  hospitalized for CP. Thought to be GERD/dysphagia related. Reviewed lab work and imaging studies including CT.   Note barium swallow with distal esophageal stricture---HH, she has never had endoscopy, not on PPI (until hospitlalization). She denies significant gerd or dyspagia  Chest pain: HM has not had any recurrent symptoms. She continues to be very active without any exertional chest discomfort.  Past Medical History  Diagnosis Date  . Allergy   . Hyperlipidemia   . Hypertension   . Glaucoma   . Osteopenia    Past Surgical History  Procedure Date  . Abdominal hysterectomy   . Tubal ligation 1976    bilateral  . Thyroidectomy   . Tonsilectomy, adenoidectomy, bilateral myringotomy and tubes   . Bunionectomy 1985    bilateral  . Trigger finger release 1999  . Carpal tunnel release 2008  . Retinal detachment surgery 2008    reports that she has never smoked. She does not have any smokeless tobacco history on file. She reports that she does not drink alcohol. Her drug history not on file. family history includes Cancer in her father; Diabetes in her mother; Heart attack in her mother; Heart disease in her mother; and Stroke in her mother. Allergies  Allergen Reactions  . Lisinopril     REACTION: hallucinations/diarrhea      Review of Systems  patient denies chest pain, shortness of breath, orthopnea. Denies lower extremity edema, abdominal pain, change in appetite, change in bowel movements. Patient denies rashes, musculoskeletal complaints. No other specific complaints in a complete review of systems.      Objective:   Physical Exam  Well-developed well-nourished female in no acute distress. HEENT exam atraumatic, normocephalic, extraocular muscles are intact. Neck is supple. No jugular venous distention no thyromegaly. Chest clear to auscultation without  increased work of breathing. Cardiac exam S1 and S2 are regular. Abdominal exam active bowel sounds, soft, nontender. Extremities no edema. Neurologic exam she is alert without any motor sensory deficits. Gait is normal.      Assessment & Plan:

## 2010-07-16 NOTE — Assessment & Plan Note (Signed)
Based on esophagram at time of hospitalization for Chest pain Known Hiatal hernia Refer for endoscopy and esoph dilatation

## 2010-07-16 NOTE — Assessment & Plan Note (Signed)
Adequate control. Continue current medications. 

## 2010-07-17 NOTE — H&P (Signed)
[name], [name] NO.:  [id number]  MEDICAL RECORD NO.:  [id number]           PATIENT TYPE:  E  LOCATION:  [facility]                         FACILITY:  [facility]  PHYSICIAN:  Meghan Overlie, MD       DATE OF BIRTH:  07/24/1945  DATE OF ADMISSION:  07/12/2010 DATE OF DISCHARGE:                             HISTORY & PHYSICAL   PRIMARY CARE PHYSICIAN:  Meghan Mole. Swords, MD.  CHIEF COMPLAINT:  Chest pain.  SUBJECTIVE:  This is a 65 year old female who presents to the ED with a chief complaint of substernal chest pain described as sharp, located in the middle of the chest lasting about 30 minutes since the patient's first episode.  The patient has had several episodes, each lasting 5-10 minutes and relieved by 2 nitroglycerins that were given to her in the ED.  The patient denied any fever, chills, or rigors.  Early this morning, she had 10 episodes of loose bowel movements described as greenish-yellow stools.  However, the diarrhea was self-limited and resolved within 6 hours period.  She denied any other symptoms.  She intermittently has some right upper quadrant pain today but denies any history of biliary colic.  She has a longstanding history of hypertension and there is no history of prior coronary artery disease.  PAST MEDICAL HISTORY: 1. Duodenal diverticulitis. 2. Hypertension. 3. Glaucoma. 4. History of esophagitis. 5. Hiatal hernia.  PAST SURGICAL HISTORY:  The patient has had tubal ligation, thyroid surgery, hysterectomy, bunion repair in both feet, trigger finger release, laser surgery for the hole in the retina, carpal tunnel surgery of the left hand.  OTHER MEDICAL PROBLEMS:  Fecal incontinence, posterior vitreous detachment, fibromuscular dysplasia, hiatal hernia.  REVIEW OF SYSTEMS:  As documented in HPI.  ALLERGIES:  No known drug allergies.  SOCIAL HISTORY:  The patient denies any history of smoking or alcohol abuse.  FAMILY HISTORY:   Positive for mother who had coronary artery disease, CABG, and stents placed.  Also had history of many strokes.  She also had a history of myocardial infarction in her mid 62s and she was also diabetic.  Father died of renal cancer.  Brother is healthy.  HOME MEDICATIONS:  Brimonidine, levobunolol, Restasis, aspirin, Diovan, Premarin, multivitamin, vitamin D3, calcium, and Claritin.  PHYSICAL EXAMINATION:  VITAL SIGNS:  Blood pressure 137/60, pulse 72, respirations 19, and temperature 97.6. GENERAL:  Comfortable in no acute cardiopulmonary distress. HEENT:  Pupils equal and reactive.  Extraocular movements intact. NECK:  Supple.  No JVD. LUNGS:  Clear to auscultation bilaterally.  No wheezes, no crackles, and no rhonchi. CARDIOVASCULAR:  Regular rate and rhythm.  No murmurs, rubs, or gallops. ABDOMEN:  Soft, nontender, and nondistended except for minimal right upper quadrant tenderness.  Murphy sign is negative. EXTREMITIES:  Without cyanosis, clubbing, or edema. NEUROLOGIC:  Cranial nerves II-XII grossly intact. PSYCHIATRIC:  Appropriate mood and affect.  LABORATORY DATA:  EKG shows normal sinus rhythm without any ST-T segment changes. Cardiac enzymes:  Troponin less than 0.05.  CBC:  WBC 8.1, hemoglobin 12.9, hematocrit 36.5, and a platelet count of 285.  BMET:  Sodium 138, potassium 3.5, chloride 102,  bicarb 28, glucose 152, BUN 10, creatinine 0.75, and calcium of 8.9. Chest x-ray shows no acute cardiopulmonary disease.  ASSESSMENT AND PLAN: 1. Chest pain somewhat atypical for coronary artery disease.  The     patient is on Premarin as well as she has a frequent history of     traveling.  Therefore, it is important to rule out a pulmonary     embolism.  Also in the setting of fibromuscular dysplasia, it is     important to rule out dissection.  Therefore, we will go ahead and     check a D-dimer and if it is elevated order a CT angio of the chest     to rule out a pulmonary  embolism.  The patient could also have a     diffuse esophageal spasm in the setting of her gastroesophageal     reflux disease and hiatal hernia.  The patient is also tender in     the right upper quadrant and therefore need to rule out gallstones. 2. Hypertension. 3. Diarrhea which was self-limiting.  PLAN:  The patient will be admitted to the telemetry floor.  We will cycle cardiac enzymes to rule out an acute coronary event.  We will do a CT angio of the chest to rule out a PE and also rule out dissection.  If the above workup is negative, we will do a right upper quadrant ultrasound in the morning to rule out gallstones.  We will also do a barium swallow to rule out diffuse esophageal spasm.  If the patient's workup is negative, then a outpatient stress test can be considered.  In the meantime, we will empirically start the patient on Lovenox, aspirin, and a low-dose beta blocker.  We will also start her on a PPI because of her history of hiatal hernia and gastroesophageal reflux disease.  Her POA is her husband, [name] at [phone].     Meghan Overlie, MD     NA/MEDQ  D:  07/13/2010  T:  07/13/2010  Job:  [id number]  Electronically Signed by Meghan Overlie MD on 07/17/2010 12:38:27 PM

## 2010-07-23 NOTE — Discharge Summary (Signed)
[name], [name]          ACCOUNT NO.:  [id number]  MEDICAL RECORD NO.:  [id number]           PATIENT TYPE:  I  LOCATION:  3711                         FACILITY:  [facility]  PHYSICIAN:  Kathlen Mody, MD       DATE OF BIRTH:  October 21, 1945  DATE OF ADMISSION:  07/12/2010 DATE OF DISCHARGE:  07/14/2010                              DISCHARGE SUMMARY   DISCHARGE DIAGNOSES: 1. Atypical chest pain, most likely secondary to gastroesophageal     reflux disease. 2. Hiatal hernia. 3. Fibromuscular dysplasia. 4. Esophageal spasms.  DISCHARGE MEDICATIONS: 1. Protonix 40 mg 1 tablet for 2 weeks. 2. Brimonidine ophthalmic solution 1 drop twice a day on each eye. 3. Levobunolol ophthalmic solution each eye 1 drop daily. 4. Restasis 1 drop twice a day in each eye. 5. Aspirin 81 mg. 6. Diovan HCT 80/12.5 half a tablet daily. 7. Premarin 1 tablet daily. 8. Multivitamin 1 tablet daily. 9. Calcium 1 tablet daily. 10.Vitamin D 1 tablet daily. 11.Align 1 tablet daily. 12.Clonidine 1 tablet daily.  PERTINENT LABORATORY FINDINGS:  CBC within normal limits.  Basic metabolic panel significant for a glucose of 152.  Point-of-care cardiac markers negative, cardiac enzymes x3 were negative.  B-natriuretic peptide less than 30.  Lipid profile; total cholesterol of 152, triglycerides of 246, LDL of 57, hemoglobin A1c of 6.3, amylase 27, lipase 21, mag and phos within normal limits.  RADIOLOGY:  The patient had a chest x-ray showed no acute cardiopulmonary process.  Ultrasound of the abdomen was normal.  CT angiogram, no evidence of PE, minimal bilateral dependent atelectasis. Barium swallow to rule out diffuse esophageal spasm shows no evidence of esophagitis, small sliding hiatal hernia without demonstration of esophagitis, mild distal esophageal stricture.  There is moderate dysmotility with intermittent tertiary contractions.  CONSULTS:  Gastroenterology verbal consult from Dr. Loreta Ave.  BRIEF  HOSPITAL COURSE:  A 65 year old lady with history of hypertension, esophagitis, hiatal hernia, diverticulitis who came in complaining of atypical chest pain.  She was worked up for pulmonary embolism as she is on Premarin.  She had a CT angiogram done negative for PE also in the setting of fibromuscular dysplasia.  She also was ruled out for aortic dissection.  The patient was also worked up for acute coronary syndromes with the cardiac enzymes and an EKG, EKG was within normal limits.  No ST and T segment changes.  Cardiac enzymes x3 were negative.  Chest x- ray was negative.  So chest pain could be most likely secondary to her gastroesophageal reflux disease and hiatal hernia.  She was ruled out for diffuse esophageal spasm.  She underwent a barium swallow test which showed distal esophageal stricture and moderate dysmotility with intermittent tertiary contractions.  Dr. Loreta Ave was on call for [facility] and called Dr. Loreta Ave and she recommended it is common in elderly people and recommended outpatient followup.  Since the patient follows with [facility], the patient was recommended to follow up with the [facility] gastroenterologist outpatient at this time.  Hypertension controlled.  On the day of discharge, the patient's vitals include temperature of 97.8, pulse of 70, respirations 18, blood pressure 109/64, saturating  99%.  On exam, she is alert, afebrile, oriented x3, comfortable in no acute distress.  Cardiovascular exam, S1 and S2 heard.  Respiratory exam, good air entry bilateral.  Abdomen is soft, nontender, nondistended.  Good bowel sounds.  Extremities, no pedal edema.  The patient at this time is hemodynamically stable for discharge.  Follow with PCP in about 1 week.  Follow up with [facility] to evaluate for the GERD and hiatal hernia.  Follow up with [facility] with an outpatient stress test.          ______________________________ Kathlen Mody, MD     VA/MEDQ  D:  07/14/2010  T:  07/14/2010  Job:  [id number]  Electronically Signed by Kathlen Mody MD on 07/23/2010 09:46:39 PM

## 2010-08-19 NOTE — Discharge Summary (Signed)
NAME:  Meghan, Gray NO.:  [id number]   MEDICAL RECORD NO.:  [id number]          PATIENT TYPE:  OUT   LOCATION:  XRAY                          FACILITY[facility]  [facility]   PHYSICIAN:  Valerie A. Felicity Coyer, MDDATE OF BIRTH:  [birth year]   DATE OF ADMISSION:  11/22/2007  DATE OF DISCHARGE:  11/24/2007                               DISCHARGE SUMMARY   DISCHARGE DIAGNOSES:  1. Duodenal diverticulitis.  2. Hypertension.  3. History of glaucoma.  4. Probable esophagitis per CT, started on Protonix this admission.  5. Hypokalemia, status post repletion.   HISTORY OF PRESENT ILLNESS:  [name] is a 65 year old female who  was admitted on November 22, 2007, with chief complaint of abdominal pain.  She noted some diffuse abdominal pain, which started in the day of  admission and was associated with some nausea.  She became concerned and  went to [facility] where she underwent right upper quadrant ultrasound,  which showed normal gallbladder and mild echogenic liver.  She also  underwent CT scan of the abdomen and pelvis, which noted debris and gas  collection posterior to the descending duodenum, which was felt to  represent atypically positioned duodenal diverticulum.  However, subtle  surrounding edema could not be excluded.  In the setting of her mild  elevation of white blood cell count, it is felt that she had a mild  diverticulitis.  She is admitted for further evaluation and treatment.   COURSE OF HOSPITALIZATION.:  1. Duodenal diverticulitis.  The patient was admitted.  She was placed      on IV Cipro and IV Flagyl.  She was initially made n.p.o.  Her      white blood cell count trended downward and on November 23, 2007, had      gone down from 12.8-10.4.  She has remained afebrile and clinically      improved with diminishing abdominal tenderness and pain.  She is      tolerating a clear liquid diet without difficulty. We plan to      advance a low-residue diet today  and if she tolerates, anticipate      discharge to home later this afternoon to complete oral Cipro and      Flagyl for a total of a 10-day course.  She is instructed, however,      to contact Dr. Cato Mulligan should she develop fever greater than 101,      worsening abdominal pain, nausea, or vomiting or go to the ER if      severe.  2. Probable esophagitis per CT.  The patient was started on Protonix      during this admission.  Consider further workup as an outpatient in      the setting of chronic Fosamax use.  She was reminded to sit      upright after taking Fosamax weekly.   FOLLOWUP:  The patient is to follow up with Dr. Birdie Sons on December 01, 2007, at 8:30 a.m.   MEDICATIONS AT TIME OF DISCHARGE:  1. Aspirin 81 mg p.o. daily.  2. Clarinex 5 mg p.o. daily.  3. Premarin 0.65 mg p.o. daily.  4. Alphagan drops 2 drops to both eyes b.i.d.  5. Flonase 1 spray each nostril daily.  6. Benicar HCT 20/12.5 one half tablet p.o. daily in the morning.  7. Fosamax 70 mg p.o. weekly.  8. Multivitamin 1 tablet p.o. daily.  9. Vitamin D 1000 units p.o. daily.  10.Calcium 1200 mg p.o. daily.  11.Folic acid 400 mg p.o. daily.  12.Cipro 500 mg p.o. b.i.d. x9 days.  13.Flagyl 500 mg p.o. q.8 h. for 9 days.  14.Protonix 40 mg p.o. daily.   PERTINENT LABORATORY DATA:  At the time of discharge; sodium 138,  potassium 3.3 prior to repletion, glucose 131, BUN 4, creatinine 0.66,  hemoglobin 12.1, hematocrit 34.6, white blood cell count 10.4, and  platelets 304.  Greater than 30 minutes were spent on discharge  planning.      Sandford Craze, NP      Raenette Rover. Felicity Coyer, MD  Electronically Signed    MO/MEDQ  D:  11/24/2007  T:  11/24/2007  Job:  [id number]   cc:   Valetta Mole. Swords, MD

## 2010-08-19 NOTE — H&P (Signed)
NAME:  [name], [name] NO.:  [id number]   MEDICAL RECORD NO.:  [id number]          PATIENT TYPE:  OBV   LOCATION:  5118                         FACILITY:  [facility]   PHYSICIAN:  [name], M.D.DATE OF BIRTH:  1945-10-24   DATE OF ADMISSION:  11/22/2007  DATE OF DISCHARGE:                              HISTORY & PHYSICAL   PRIMARY CARE PHYSICIAN:  The patient's PCP is [name], M.D.   CHIEF COMPLAINT:  Abdominal pain.   HISTORY OF THE PRESENT ILLNESS:  The patient is a 65 year old white  female with a past medical history of glaucoma and hypertension who  started complaining of some diffuse abdominal pain today with some  associated nausea.  She has not had a previous episodes like this and no  diarrhea.  She became concerned and went to [facility].  There she was  evaluated and underwent where she was noted to have most significant  abdominal pain of the right upper quadrant.  She underwent a right upper  quadrant ultrasound, which showed a normal gallbladder and a mild  echogenic liver possibly noting hepatic steatosis.  The patient then  underwent a CT scan of the abdomen and pelvis, which noted debris and  gas collection posterior to the descending duodenum felt to represent an  atypically positioned duodenal diverticulum; however, subtle surrounding  edema could not be excluded.  Given her clinical settings with a  slightly elevated white count they felt that mild diverticulitis could  have this appearance.  She was also noted to have an incidentally noted  small hiatal hernia with distal esophagitis.  Given these findings the  patient was then sent over to her PCP's office where her PCP's partner  referred her over to the emergency room for evaluation and possible  admission.   Upon arrival to the [facility] ER the patient had labs done.  She noted to  have a white count of 12.  The patient was given medicine for pain and  medicine for nausea after  which she suddenly felt better.   REVIEW OF SYSTEMS:  The patient currently denies any headaches, vision  changes or dysphagia.  She complains of mild nausea, but no vomiting.  No chest pain, palpitations, shortness breath, wheezes, or cough.  She  complains some moderate abdominal pain, but no hematuria, dysuria,  constipation, diarrhea, nor focal extremity numbness, weakness or pain.  The review of systems is otherwise negative.   PAST MEDICAL HISTORY:  The patient's past medical history includes  hypertension and glaucoma.   MEDICATIONS:  The medications she is on are Astelin, aspirin, Clarinex,  Premarin, Alphagan-P eye drops, Flonase, Benicar HCT, and Fosamax.   ALLERGIES:  The patient has no known drug allergies.   SOCIAL HISTORY:  No tobacco, alcohol or drug use.   FAMILY HISTORY:  The family history is noncontributory.   PHYSICAL EXAMINATION:  VITAL SIGNS:  The patient's vital signs on  admission are temperature 99.6, heart rate 80, blood pressure 136/65,  respirations 20, and O2 sat 99% on room air.  GENERAL APPEARANCE:  In general she is alert and oriented x3, and  in no  apparent distress.  HEENT:  Head is normocephalic and atraumatic.  Her mucous membranes are  slightly dry.  NECK:  The patient has no carotid bruits.  HEART:  The heart has a regular rate and rhythm, S1 and S2.  LUNGS:  The lungs are clear to auscultation bilaterally.  ABDOMEN:  The abdomen is soft and slightly distended with some diffuse  nonspecific tenderness, although more so in the right upper quadrant.  Hypoactive bowel sounds.  EXTREMITIES:  The extremities show no clubbing, cyanosis or edema.   LABORATORY WORK:  White count 12.8, H&H are 13.1 and 39, MCV 89, and  platelet count is 338,000 with 80% neutrophils.  Sodium 136, potassium  3.8, chloride 98, bicarb 29, BUN 5, creatinine 0.5, and glucose 115.  LFTs are unremarkable.  Lipase level is normal.  UA is unremarkable.  Lactic acid level was  2.   ASSESSMENT AND PLAN:  1. Abdominal pain with mildly increased white count and questionable      duodenal diverticulitis.  The plan is for intravenous fluids and      intravenous  antibiotics.  We will recheck laboratories in the      morning.  2. Hypertension.  3. Glaucoma.      [name], M.D.  Electronically Signed     SKK/MEDQ  D:  11/22/2007  T:  11/23/2007  Job:  [id number]

## 2010-08-22 NOTE — Op Note (Signed)
NAME:  [name], [name] NO.:  [id number]   MEDICAL RECORD NO.:  [id number]          PATIENT TYPE:  AMB   LOCATION:  NESC                         FACILITY[facility]   PHYSICIAN:  Deidre Ala, M.D.    DATE OF BIRTH:  Oct 05, 1945   DATE OF PROCEDURE:  08/05/2006  DATE OF DISCHARGE:                               OPERATIVE REPORT   PREOPERATIVE DIAGNOSIS:  Left carpal tunnel syndrome.   POSTOPERATIVE DIAGNOSIS:  Left carpal tunnel syndrome.   PROCEDURE:  Left carpal tunnel release.   SURGEON:  1. Charlesetta Shanks, M.D.   ASSISTANT:  Phineas Semen, P.A.   ANESTHESIA:  IV regional.   CULTURES:  None.   DRAINS:  None.   ESTIMATED BLOOD LOSS:  Minimal.   TOURNIQUET TIME:  30 minutes.   PATHOLOGIC FINDINGS AND HISTORY:  Katrina Daddona was sent for  consultation by Dr. Birdie Sons.  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  Left is worse than right.  Positive nerve conductions were  found with moderate right, mild left, and the left is worse  symptomatically.  The pain is constant, worse at night.  We talked about  various options.  She decided to proceed with release at surgery.  Classic findings were noted with hour-glassing.  There was an a  aberrant, more ulnarly-based branch of the palmar cutaneous nerve that  was carefully saved and protected, and we did the classic release.   PROCEDURE:  With adequate anesthesia obtained using IV regional  technique, the patient was placed in the supine position.  The left  upper extremity was prepped from the fingertips to the upper forearm in  the standard fashion.  After standard prepping and draping of the IV  regional technique anesthetized arm, the patient had been placed in the  supine position.  We had made an incision then from midpalm to the  distal wrist flexion crease and along the thenar crease.  The incision  was deepened sharply and  adequate hemostasis obtained using the Bovie  electrocoagulator.  Under loupe magnification, dissection was carried  down to the palmar fascia and retractors were placed.  I then placed a  Freer elevator underneath the fascia and the transverse carpal ligament  to protect the nerve and cut down upon it with a 64 Beaver blade.  Careful neurolysis was then carried out with tenotomy scissors and an  epineurectomy carried out over the volar one-half of the nerve.  All  branches were traced distally including the motor branch with the above  notation about preservation of the palmar cutaneous nerve branch that  was aberrant.  I did a release in the distal forearm of the canal  underneath the skin.  Irrigation was carried out.  The wound was then  closed with running and interrupted 4-0 nylon.  A bulky sterile  compressive dressing was applied with slight volar  cock-up, plaster and splint and ACE.  The patient, having the procedure  well, was awakened, taken to the recovery room in satisfactory  condition, to  be discharged per outpatient routine, given Vicodin for  pain, and told call the office for a recheck on Monday.           ______________________________  V. Charlesetta Shanks, M.D.     VEP/MEDQ  D:  08/05/2006  T:  08/05/2006  Job:  [id number]   cc:   Valetta Mole. Swords, MD  [address] [place]  [place] [address]

## 2010-08-26 ENCOUNTER — Ambulatory Visit (INDEPENDENT_AMBULATORY_CARE_PROVIDER_SITE_OTHER): Payer: Medicare Other | Admitting: Gastroenterology

## 2010-08-26 ENCOUNTER — Encounter: Payer: Self-pay | Admitting: Gastroenterology

## 2010-08-26 DIAGNOSIS — Z800 Family history of malignant neoplasm of digestive organs: Secondary | ICD-10-CM

## 2010-08-26 DIAGNOSIS — R159 Full incontinence of feces: Secondary | ICD-10-CM

## 2010-08-26 DIAGNOSIS — D649 Anemia, unspecified: Secondary | ICD-10-CM | POA: Insufficient documentation

## 2010-08-26 DIAGNOSIS — K222 Esophageal obstruction: Secondary | ICD-10-CM

## 2010-08-26 MED ORDER — SUCRALFATE 1 G PO TABS: 1.0000 g | ORAL_TABLET | Freq: Four times a day (QID) | ORAL | Status: DC

## 2010-08-26 NOTE — Assessment & Plan Note (Signed)
This continues to be a problem exacerbated by what sounds like IBS with loose stools.  Medications #1 fiber supplementation daily #2 Carafate 1 g 4 times a day in an effort to solidify her stools #3 I encouraged the patient to attempt to have a daily bowel movement

## 2010-08-26 NOTE — Assessment & Plan Note (Addendum)
Chest pain and certainly history of dysphagia is related to this stricture.  Recommendations #1 plan upper endoscopy with dilatation as indicated #2 continue Protonix

## 2010-08-26 NOTE — Progress Notes (Signed)
History of Present Illness:  [name] is a pleasant 65 year old white female referred at the request of Dr. Cato Mulligan for evaluation of chest pain. She was hospitalized in April, 2012 with chest pain. Cardiac etiology was ruled out. On protonix pain entirely subsided. She had been complaining of frequent pyrosis and occasional dysphagia to solids. Barium swallow demonstrated a distal esophageal stricture.  The patient suffers from incontinence of stool. 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 She was seen at [facility] about 10 years ago. Apparently biofeedback therapy was not felt applicable. She has frequent loose stools, especially postprandially with incontinence.      Review of Systems: Pertinent positive and negative review of systems were noted in the above HPI section. All other review of systems were otherwise negative.    Current Medications, Allergies, Past Medical History, Past Surgical History, Family History and Social History were reviewed in [employer] electronic medical record  Vital signs were reviewed in today's medical record. Physical Exam: General: Well developed , well nourished, no acute distress Head: Normocephalic and atraumatic Eyes:  sclerae anicteric, EOMI Ears: Normal auditory acuity Mouth: No deformity or lesions Lungs: Clear throughout to auscultation Heart: Regular rate and rhythm; no murmurs, rubs or bruits Abdomen: Soft, non tender and non distended. No masses, hepatosplenomegaly or hernias noted. Normal Bowel sounds Rectal:deferred Musculoskeletal: Symmetrical with no gross deformities  Pulses:  Normal pulses noted Extremities: No clubbing, cyanosis, edema or deformities noted Neurological: Alert oriented x 4, grossly nonfocal Psychological:  Alert and cooperative. Normal mood and affect

## 2010-08-26 NOTE — Patient Instructions (Signed)
Upper GI Endoscopy Upper GI endoscopy means using a flexible scope to look at the esophagus, stomach and upper small bowel. This is done to make a diagnosis in people with heartburn, abdominal pain, or abnormal bleeding. Sometimes an endoscope is needed to remove foreign bodies or food that become stuck in the esophagus; it can also be used to take biopsy samples. For the best results, do not eat or drink for 8 hours before having your upper endoscopy.  To perform the endoscopy, you will probably be sedated and your throat will be numbed with a special spray. The endoscope is then slowly passed down your throat (this will not interfere with your breathing). An endoscopy exam takes 15-30 minutes to complete and there is no real pain. Patients rarely remember much about the procedure. The results of the test may take several days if a biopsy or other test is taken.  You may have a sore throat after an endoscopy exam. Serious complications are very rare. Stick to liquids and soft foods until your pain is better. You should not drive a car or operate any dangerous equipment for at least 24 hours after being sedated. SEEK IMMEDIATE MEDICAL CARE IF:  You have severe throat pain.   You have shortness of breath.   You have bleeding problems.   You have a fever.   You have difficulty recovering from your sedation.  Document Released: 04/30/2004 Document Re-Released: 06/17/2009 [facility] Patient Information 2011 [place], [place]. Your EGD is scheduled on 09/11/2010 at 2:30pm

## 2010-08-26 NOTE — Assessment & Plan Note (Signed)
The patient has a normochromic normocytic anemia of unclear etiology.  Recommendation #1 stool Hemoccult

## 2010-08-27 ENCOUNTER — Encounter: Payer: Self-pay | Admitting: Gastroenterology

## 2010-08-29 ENCOUNTER — Other Ambulatory Visit: Payer: Medicare Other

## 2010-09-02 ENCOUNTER — Other Ambulatory Visit (AMBULATORY_SURGERY_CENTER): Payer: Medicare Other | Admitting: Gastroenterology

## 2010-09-02 ENCOUNTER — Encounter: Payer: Self-pay | Admitting: Gastroenterology

## 2010-09-02 DIAGNOSIS — Z1289 Encounter for screening for malignant neoplasm of other sites: Secondary | ICD-10-CM

## 2010-09-11 ENCOUNTER — Encounter: Payer: Self-pay | Admitting: Gastroenterology

## 2010-09-11 ENCOUNTER — Ambulatory Visit (AMBULATORY_SURGERY_CENTER): Payer: Medicare Other | Admitting: Gastroenterology

## 2010-09-11 DIAGNOSIS — K2940 Chronic atrophic gastritis without bleeding: Secondary | ICD-10-CM

## 2010-09-11 DIAGNOSIS — K2990 Gastroduodenitis, unspecified, without bleeding: Secondary | ICD-10-CM

## 2010-09-11 DIAGNOSIS — K222 Esophageal obstruction: Secondary | ICD-10-CM

## 2010-09-11 DIAGNOSIS — R1310 Dysphagia, unspecified: Secondary | ICD-10-CM

## 2010-09-11 DIAGNOSIS — K2970 Gastritis, unspecified, without bleeding: Secondary | ICD-10-CM

## 2010-09-11 MED ORDER — SODIUM CHLORIDE 0.9 % IV SOLN: 500.0000 mL | INTRAVENOUS | Status: DC

## 2010-09-11 NOTE — Patient Instructions (Signed)
DISCHARGE INSTRUCTIONS GIVEN WITH VERBAL UNDERSTANDING. HANDOUT ON GASTRITIS A DILATATION DIET. RESUME PREVIOUS MEDICATIONS.

## 2010-09-12 ENCOUNTER — Encounter: Payer: Self-pay | Admitting: *Deleted

## 2010-09-12 ENCOUNTER — Telehealth: Payer: Self-pay | Admitting: *Deleted

## 2010-09-12 NOTE — Telephone Encounter (Signed)

## 2010-11-07 ENCOUNTER — Ambulatory Visit (INDEPENDENT_AMBULATORY_CARE_PROVIDER_SITE_OTHER): Payer: Medicare Other | Admitting: Gastroenterology

## 2010-11-07 ENCOUNTER — Encounter: Payer: Self-pay | Admitting: Gastroenterology

## 2010-11-07 DIAGNOSIS — R159 Full incontinence of feces: Secondary | ICD-10-CM

## 2010-11-07 DIAGNOSIS — K222 Esophageal obstruction: Secondary | ICD-10-CM

## 2010-11-07 MED ORDER — PANTOPRAZOLE SODIUM 40 MG PO TBEC: 40.0000 mg | DELAYED_RELEASE_TABLET | Freq: Every day | ORAL | Status: DC

## 2010-11-07 NOTE — Assessment & Plan Note (Addendum)
Improved following dilatation therapy and protonix.  Recommendations #1 resume Protonix #2 repeat dilatation as needed

## 2010-11-07 NOTE — Assessment & Plan Note (Addendum)
Historically a problem although it is improved with combination of fiber, protonix and Carafate.  Recommendations #1 continue current medications include Protonix #2 if incontinence remains a problem I will refer her to [facility] for anal manometry

## 2010-11-07 NOTE — Patient Instructions (Signed)
Call back as needed 

## 2010-11-07 NOTE — Progress Notes (Signed)
History of Present Illness:  [name] has returned for followup of her chest discomfort, dysphagia and stool incontinence. Mild gastritis and early esophageal stricture were seen and dilated. She was feeling well while taking Protonix. After running out of her medicine she  noted recurrence of her heartburn. Her stool incontinence also improved on a regimen of fiber, Carafate and Protonix.  It is becoming a worsening problem again since running out of Protonix.   Review of Systems: Pertinent positive and negative review of systems were noted in the above HPI section. All other review of systems were otherwise negative.    Current Medications, Allergies, Past Medical History, Past Surgical History, Family History and Social History were reviewed in [employer] electronic medical record  Vital signs were reviewed in today's medical record. Physical Exam: General: Well developed , well nourished, no acute distress

## 2010-12-22 ENCOUNTER — Other Ambulatory Visit: Payer: Self-pay | Admitting: Internal Medicine

## 2010-12-23 MED ORDER — ESTROGENS CONJUGATED 0.45 MG PO TABS: 0.4500 mg | ORAL_TABLET | Freq: Every day | ORAL | Status: DC

## 2010-12-23 MED ORDER — VALSARTAN-HYDROCHLOROTHIAZIDE 80-12.5 MG PO TABS: 1.0000 | ORAL_TABLET | Freq: Every day | ORAL | Status: DC

## 2011-02-18 ENCOUNTER — Other Ambulatory Visit: Payer: Self-pay | Admitting: Internal Medicine

## 2011-02-18 DIAGNOSIS — Z1231 Encounter for screening mammogram for malignant neoplasm of breast: Secondary | ICD-10-CM

## 2011-03-12 ENCOUNTER — Telehealth: Payer: Self-pay | Admitting: *Deleted

## 2011-03-12 DIAGNOSIS — M25569 Pain in unspecified knee: Secondary | ICD-10-CM

## 2011-03-12 NOTE — Telephone Encounter (Signed)
Received message from pt stating she fell on concrete floor and injured her knee and elbow about 6-7 months ago. Has had intermittent pain since then. Pain has been constant x 1 month and has popping of her knee. Please advise.

## 2011-03-13 NOTE — Telephone Encounter (Signed)
Let's have her see ortho

## 2011-03-13 NOTE — Telephone Encounter (Signed)
Referral order placed, pt aware 

## 2011-03-20 ENCOUNTER — Ambulatory Visit (HOSPITAL_COMMUNITY)
Admission: RE | Admit: 2011-03-20 | Discharge: 2011-03-20 | Disposition: A | Discharge status: Home / Self Care | Payer: Medicare Other | Source: Ambulatory Visit | Attending: Internal Medicine | Admitting: Internal Medicine

## 2011-03-20 DIAGNOSIS — Z1231 Encounter for screening mammogram for malignant neoplasm of breast: Secondary | ICD-10-CM | POA: Insufficient documentation

## 2011-04-07 HISTORY — PX: KNEE ARTHROSCOPY: SHX127

## 2011-04-10 DIAGNOSIS — IMO0002 Reserved for concepts with insufficient information to code with codable children: Secondary | ICD-10-CM | POA: Diagnosis not present

## 2011-04-17 DIAGNOSIS — IMO0002 Reserved for concepts with insufficient information to code with codable children: Secondary | ICD-10-CM | POA: Diagnosis not present

## 2011-04-27 ENCOUNTER — Other Ambulatory Visit: Payer: Self-pay | Admitting: Gastroenterology

## 2011-04-28 DIAGNOSIS — IMO0002 Reserved for concepts with insufficient information to code with codable children: Secondary | ICD-10-CM | POA: Diagnosis not present

## 2011-04-28 DIAGNOSIS — M79609 Pain in unspecified limb: Secondary | ICD-10-CM | POA: Diagnosis not present

## 2011-05-15 DIAGNOSIS — M1710 Unilateral primary osteoarthritis, unspecified knee: Secondary | ICD-10-CM | POA: Diagnosis not present

## 2011-05-15 DIAGNOSIS — M6750 Plica syndrome, unspecified knee: Secondary | ICD-10-CM | POA: Diagnosis not present

## 2011-05-15 DIAGNOSIS — Y929 Unspecified place or not applicable: Secondary | ICD-10-CM | POA: Diagnosis not present

## 2011-05-15 DIAGNOSIS — X58XXXA Exposure to other specified factors, initial encounter: Secondary | ICD-10-CM | POA: Diagnosis not present

## 2011-05-15 DIAGNOSIS — M23305 Other meniscus derangements, unspecified medial meniscus, unspecified knee: Secondary | ICD-10-CM | POA: Diagnosis not present

## 2011-05-15 DIAGNOSIS — M2240 Chondromalacia patellae, unspecified knee: Secondary | ICD-10-CM | POA: Diagnosis not present

## 2011-05-15 DIAGNOSIS — IMO0002 Reserved for concepts with insufficient information to code with codable children: Secondary | ICD-10-CM | POA: Diagnosis not present

## 2011-07-21 ENCOUNTER — Encounter: Payer: Self-pay | Admitting: Internal Medicine

## 2011-07-21 ENCOUNTER — Ambulatory Visit (INDEPENDENT_AMBULATORY_CARE_PROVIDER_SITE_OTHER): Payer: Medicare Other | Admitting: Internal Medicine

## 2011-07-21 VITALS — BP 118/64 | HR 76 | Temp 97.7°F | Ht 63.0 in | Wt 142.0 lb

## 2011-07-21 DIAGNOSIS — I10 Essential (primary) hypertension: Secondary | ICD-10-CM

## 2011-07-21 DIAGNOSIS — Z23 Encounter for immunization: Secondary | ICD-10-CM | POA: Diagnosis not present

## 2011-07-21 DIAGNOSIS — E785 Hyperlipidemia, unspecified: Secondary | ICD-10-CM | POA: Diagnosis not present

## 2011-07-21 DIAGNOSIS — Z0000 Encounter for general adult medical examination without abnormal findings: Secondary | ICD-10-CM | POA: Diagnosis not present

## 2011-07-21 DIAGNOSIS — R7309 Other abnormal glucose: Secondary | ICD-10-CM

## 2011-07-21 DIAGNOSIS — D649 Anemia, unspecified: Secondary | ICD-10-CM

## 2011-07-21 LAB — LDL CHOLESTEROL, DIRECT: Direct LDL: 102.6 mg/dL

## 2011-07-21 LAB — BASIC METABOLIC PANEL
BUN: 12 mg/dL (ref 6–23)
Calcium: 8.8 mg/dL (ref 8.4–10.5)
Creatinine, Ser: 0.8 mg/dL (ref 0.4–1.2)
GFR: 82.01 mL/min (ref >60.00)

## 2011-07-21 LAB — CBC WITH DIFFERENTIAL/PLATELET
Basophils Relative: 0.4 % (ref 0.0–3.0)
Eosinophils Absolute: 0.1 10*3/uL (ref 0.0–0.7)
Hemoglobin: 12.8 g/dL (ref 12.0–15.0)
Lymphs Abs: 1.8 10*3/uL (ref 0.7–4.0)
MCHC: 33.7 g/dL (ref 30.0–36.0)
MCV: 89 fl (ref 78.0–100.0)
Monocytes Absolute: 0.4 10*3/uL (ref 0.1–1.0)
Neutro Abs: 4.3 10*3/uL (ref 1.4–7.7)
Neutrophils Relative %: 64.2 % (ref 43.0–77.0)
RBC: 4.26 Mil/uL (ref 3.87–5.11)

## 2011-07-21 LAB — HEPATIC FUNCTION PANEL
Alkaline Phosphatase: 64 U/L (ref 39–117)
Bilirubin, Direct: 0 mg/dL (ref 0.0–0.3)
Total Bilirubin: 0.3 mg/dL (ref 0.3–1.2)
Total Protein: 6.9 g/dL (ref 6.0–8.3)

## 2011-07-21 LAB — LIPID PANEL
HDL: 51.6 mg/dL (ref >39.00)
Total CHOL/HDL Ratio: 4
VLDL: 46.6 mg/dL — ABNORMAL HIGH (ref 0.0–40.0)

## 2011-07-22 NOTE — Progress Notes (Signed)
Subjective:    [name] is a 66 y.o. female who presents for Medicare Annual/Subsequent preventive examination.  Preventive Screening-Counseling & Management  Tobacco History  Smoking status  . Never Smoker   Smokeless tobacco  . Never Used     Problems Prior to Visit 1.   Current Problems (verified) Patient Active Problem List  Diagnoses  . HYPERLIPIDEMIA  . HYPERTENSION  . OSTEOPENIA  . HYPERGLYCEMIA  . Esophageal stricture  . Family history of malignant neoplasm of gastrointestinal tract  . Anemia, unspecified    Medications Prior to Visit Current Outpatient Prescriptions on File Prior to Visit  Medication Sig Dispense Refill  . aspirin 81 MG tablet Take 81 mg by mouth daily.        . brimonidine (ALPHAGAN P) 0.1 % SOLN Place 1 drop into both eyes 2 (two) times daily.        . Calcium Carbonate-Vit D-Min (GNP CALCIUM 1200) 1200-1000 MG-UNIT CHEW Chew 1 tablet by mouth daily.        . cetirizine (ZYRTEC) 10 MG tablet Take 10 mg by mouth daily.        . Cholecalciferol (VITAMIN D) 1000 UNITS capsule Take 1,000 Units by mouth daily.        Marland Kitchen estrogens, conjugated, (PREMARIN) 0.45 MG tablet Take 1 tablet (0.45 mg total) by mouth daily. Take daily for 21 days then do not take for 7 days.  30 tablet  4  . levobunolol (BETAGAN) 0.5 % ophthalmic solution Place 1 drop into both eyes daily.      . Multiple Vitamins-Iron (MULTIVITAMIN/IRON) TABS Take 1 tablet by mouth daily.        . pantoprazole (PROTONIX) 40 MG tablet TAKE 1 TABLET BY MOUTH EVERY DAY  30 tablet  5  . Probiotic Product (ALIGN) 4 MG CAPS Take 1 capsule by mouth daily.        . sucralfate (CARAFATE) 1 G tablet Take 1 tablet (1 g total) by mouth 4 (four) times daily.  120 tablet  11  . valsartan-hydrochlorothiazide (DIOVAN HCT) 80-12.5 MG per tablet Take 1 tablet by mouth daily.  30 tablet  5    Current Medications (verified) Current Outpatient Prescriptions  Medication Sig Dispense Refill  .  aspirin 81 MG tablet Take 81 mg by mouth daily.        . brimonidine (ALPHAGAN P) 0.1 % SOLN Place 1 drop into both eyes 2 (two) times daily.        . Calcium Carbonate-Vit D-Min (GNP CALCIUM 1200) 1200-1000 MG-UNIT CHEW Chew 1 tablet by mouth daily.        . cetirizine (ZYRTEC) 10 MG tablet Take 10 mg by mouth daily.        . Cholecalciferol (VITAMIN D) 1000 UNITS capsule Take 1,000 Units by mouth daily.        Marland Kitchen estrogens, conjugated, (PREMARIN) 0.45 MG tablet Take 1 tablet (0.45 mg total) by mouth daily. Take daily for 21 days then do not take for 7 days.  30 tablet  4  . levobunolol (BETAGAN) 0.5 % ophthalmic solution Place 1 drop into both eyes daily.      . Multiple Vitamins-Iron (MULTIVITAMIN/IRON) TABS Take 1 tablet by mouth daily.        . pantoprazole (PROTONIX) 40 MG tablet TAKE 1 TABLET BY MOUTH EVERY DAY  30 tablet  5  . Probiotic Product (ALIGN) 4 MG CAPS Take 1 capsule by mouth daily.        Marland Kitchen  sucralfate (CARAFATE) 1 G tablet Take 1 tablet (1 g total) by mouth 4 (four) times daily.  120 tablet  11  . valsartan-hydrochlorothiazide (DIOVAN HCT) 80-12.5 MG per tablet Take 1 tablet by mouth daily.  30 tablet  5     Allergies (verified) Lisinopril   PAST HISTORY  Family History Family History  Problem Relation Age of Onset  . Diabetes Mother   . Heart disease Mother     CABG  . Heart attack Mother   . Stroke Mother   . Cancer Father     liver  . Colon cancer Maternal Grandmother   . Pancreatic cancer Maternal Grandmother   . Stomach cancer Maternal Grandmother     Social History History  Substance Use Topics  . Smoking status: Never Smoker   . Smokeless tobacco: Never Used  . Alcohol Use: No     Are there smokers in your home (other than you)? No  Risk Factors Current exercise habits: She exercises regularly.  Dietary issues discussed: Try to follow a low-fat diet.   Cardiac risk factors: advanced age (older than 24 for men, 48 for women).  Depression  Screen (Note: if answer to either of the following is "Yes", a more complete depression screening is indicated)   Over the past two weeks, have you felt down, depressed or hopeless? No  Over the past two weeks, have you felt little interest or pleasure in doing things? No   Activities of Daily Living In your present state of health, do you have any difficulty performing the following activities?:  Driving? No Managing money?  No  Hearing Difficulties: No Do you often ask people to speak up or repeat themselves? No    Do you feel that you have a problem with memory? No  Do you often misplace items? No  Do you feel safe at home?  Yes  Cognitive Testing  Alert? Yes  Normal Appearance?Yes    Advanced Directives have been discussed with the patient? No  List the Names of Other Physician/Practitioners you currently use: 1.    Indicate any recent Medical Services you may have received from other than [facility] providers in the past year (date may be approximate).  Immunization History  Administered Date(s) Administered  . Pneumococcal Polysaccharide 07/21/2011  . Td 11/04/2007  . Zoster 08/17/2007    Screening Tests Health Maintenance  Topic Date Due  . Influenza Vaccine  01/05/2012  . Mammogram  03/19/2013  . Colonoscopy  11/26/2014  . Tetanus/tdap  11/03/2017  . Pneumococcal Polysaccharide Vaccine Age 49 And Over  Completed  . Zostavax  Completed    All answers were reviewed with the patient and necessary referrals were made:  Judie Petit, MD   07/22/2011   History reviewed: allergies, current medications, past family history, past medical history, past social history, past surgical history and problem list  Review of Systems  patient denies chest pain, shortness of breath, orthopnea. Denies lower extremity edema, abdominal pain, change in appetite, change in bowel movements. Patient denies rashes, musculoskeletal complaints. No other specific complaints in a complete  review of systems.    Objective:       Body mass index is 25.15 kg/(m^2). BP 118/64  Pulse 76  Temp(Src) 97.7 F (36.5 C) (Oral)  Ht 5\' 3"  (1.6 m)  Wt 142 lb (64.411 kg)  BMI 25.15 kg/m2  Well-developed well-nourished female in no acute distress. HEENT exam atraumatic, normocephalic symmetric her muscles are intact. Neck is supple without lymphadenopathy  or thyromegaly. Chest clear to auscultation cardiac exam S1-S2 are regular. Abdominal exam and bowel sounds, soft, nontender. Extremities no edema. Neurologic exam alert and oriented     Assessment:     Well visit     Plan:     During the course of the visit the patient was educated and counseled about appropriate screening and preventive services including:    Health maintenance is up-to-date.  Patient Instructions (the written plan) was given to the patient.  Medicare Attestation I have personally reviewed: The patient's medical and social history Their use of alcohol, tobacco or illicit drugs Their current medications and supplements The patient's functional ability including ADLs,fall risks, home safety risks, cognitive, and hearing and visual impairment Diet and physical activities Evidence for depression or mood disorders  The patient's weight, height, BMI, and visual acuity have been recorded in the chart.  I have made referrals, counseling, and provided education to the patient based on review of the above and I have provided the patient with a written personalized care plan for preventive services.     Judie Petit, MD   07/22/2011

## 2011-08-11 DIAGNOSIS — H26499 Other secondary cataract, unspecified eye: Secondary | ICD-10-CM | POA: Diagnosis not present

## 2011-08-11 DIAGNOSIS — H409 Unspecified glaucoma: Secondary | ICD-10-CM | POA: Diagnosis not present

## 2011-08-11 DIAGNOSIS — H4011X0 Primary open-angle glaucoma, stage unspecified: Secondary | ICD-10-CM | POA: Diagnosis not present

## 2011-08-11 DIAGNOSIS — H2510 Age-related nuclear cataract, unspecified eye: Secondary | ICD-10-CM | POA: Diagnosis not present

## 2011-08-17 DIAGNOSIS — H6120 Impacted cerumen, unspecified ear: Secondary | ICD-10-CM | POA: Diagnosis not present

## 2011-09-06 ENCOUNTER — Other Ambulatory Visit: Payer: Self-pay | Admitting: Gastroenterology

## 2011-10-02 ENCOUNTER — Encounter: Payer: Self-pay | Admitting: Family

## 2011-10-02 ENCOUNTER — Ambulatory Visit (INDEPENDENT_AMBULATORY_CARE_PROVIDER_SITE_OTHER): Payer: Medicare Other | Admitting: Family

## 2011-10-02 VITALS — BP 140/70 | Temp 98.1°F | Wt 139.0 lb

## 2011-10-02 DIAGNOSIS — B373 Candidiasis of vulva and vagina: Secondary | ICD-10-CM | POA: Diagnosis not present

## 2011-10-02 DIAGNOSIS — N898 Other specified noninflammatory disorders of vagina: Secondary | ICD-10-CM | POA: Diagnosis not present

## 2011-10-02 MED ORDER — TERCONAZOLE 0.8 % VA CREA: 1.0000 | TOPICAL_CREAM | Freq: Every day | VAGINAL | Status: DC

## 2011-10-02 MED ORDER — FLUCONAZOLE 150 MG PO TABS: 150.0000 mg | ORAL_TABLET | Freq: Once | ORAL | Status: AC

## 2011-10-02 NOTE — Progress Notes (Signed)
Subjective:    Patient ID: [name], female    DOB: 1946-03-25, 66 y.o.   MRN: [id number]  HPI 66 year old white female, nonsmoker, patient of Dr. Cato Mulligan is in with complaints of vaginal irritation x5 days. She denies any vaginal discharge or odor. Does admit to itching, redness, and dryness. She's been applying Neosporin and a vaginal cream with little to no relief. Denies being sexually active. Patient wears adult diapers.   Review of Systems  Constitutional: Negative.   Respiratory: Negative.   Cardiovascular: Negative.   Gastrointestinal: Negative.        Incontinence of stool  Genitourinary: Negative for vaginal bleeding and vaginal discharge.       Vaginal irritation and itching  Hematological: Negative.   Psychiatric/Behavioral: Negative.    Past Medical History  Diagnosis Date  . Allergy   . Hyperlipidemia   . Hypertension   . Glaucoma   . Osteopenia   . Arthritis   . Diverticulosis   . Status post dilation of esophageal narrowing   . Cataract   . GERD (gastroesophageal reflux disease)   . Thyroid disease     History   Social History  . Marital Status: Married    Spouse Name: N/A    Number of Children: 2  . Years of Education: N/A   Occupational History  . retired    Social History Main Topics  . Smoking status: Never Smoker   . Smokeless tobacco: Never Used  . Alcohol Use: No  . Drug Use: No  . Sexually Active: Not on file   Other Topics Concern  . Not on file   Social History Narrative   Daily caffeine    Past Surgical History  Procedure Date  . Abdominal hysterectomy   . Tubal ligation 1976    bilateral  . Thyroidectomy   . Bunionectomy 1985    bilateral  . Trigger finger release 1999  . Carpal tunnel release 2008  . Retinal detachment surgery 2008  . Tonsillectomy   . Knee arthroscopy 2012    gioffre  . Tonsilectomy, adenoidectomy, bilateral myringotomy and tubes   . Hiatal hernia repair     Family History  Problem  Relation Age of Onset  . Diabetes Mother   . Heart disease Mother     CABG  . Heart attack Mother   . Stroke Mother   . Cancer Father     liver  . Colon cancer Maternal Grandmother   . Pancreatic cancer Maternal Grandmother   . Stomach cancer Maternal Grandmother     Allergies  Allergen Reactions  . Lisinopril     REACTION: hallucinations/diarrhea    Current Outpatient Prescriptions on File Prior to Visit  Medication Sig Dispense Refill  . aspirin 81 MG tablet Take 81 mg by mouth daily.        . brimonidine (ALPHAGAN P) 0.1 % SOLN Place 1 drop into both eyes 2 (two) times daily.        . Calcium Carbonate-Vit D-Min (GNP CALCIUM 1200) 1200-1000 MG-UNIT CHEW Chew 1 tablet by mouth daily.        . cetirizine (ZYRTEC) 10 MG tablet Take 10 mg by mouth daily.        . Cholecalciferol (VITAMIN D) 1000 UNITS capsule Take 1,000 Units by mouth daily.        Marland Kitchen estrogens, conjugated, (PREMARIN) 0.45 MG tablet Take 1 tablet (0.45 mg total) by mouth daily. Take daily for 21 days then do not take for  7 days.  30 tablet  4  . levobunolol (BETAGAN) 0.5 % ophthalmic solution Place 1 drop into both eyes daily.      . Multiple Vitamins-Iron (MULTIVITAMIN/IRON) TABS Take 1 tablet by mouth daily.        . pantoprazole (PROTONIX) 40 MG tablet TAKE 1 TABLET BY MOUTH EVERY DAY  30 tablet  5  . Probiotic Product (ALIGN) 4 MG CAPS Take 1 capsule by mouth daily.        . sucralfate (CARAFATE) 1 G tablet TAKE 1 TABLET BY MOUTH 4 TIMES A DAY  120 tablet  11  . valsartan-hydrochlorothiazide (DIOVAN HCT) 80-12.5 MG per tablet Take 1 tablet by mouth daily.  30 tablet  5    BP 140/70  Temp 98.1 F (36.7 C) (Oral)  Wt 139 lb (63.05 kg)chart    Objective:   Physical Exam  Constitutional: She is oriented to person, place, and time. She appears well-developed and well-nourished.  Neck: Neck supple.  Cardiovascular: Normal rate, regular rhythm and normal heart sounds.   Pulmonary/Chest: Effort normal and  breath sounds normal.  Abdominal: Soft. Bowel sounds are normal.  Genitourinary:       External redness and vaginal excoriation noted to the labia majora. Redness also noted to the labia menorah and at the entrance of the vaginal canal. Minimal vaginal discharge.  Neurological: She is alert and oriented to person, place, and time.  Skin: Skin is warm and dry.  Psychiatric: She has a normal mood and affect.          Assessment & Plan:  Assessment: vaginal candida, incontinence of stool  Plan: Diflucan 150 mg 1 by mouth x1. Terazol 7 one applicator at bedtime x5 nights. Keep the area clean and dry. Patient call the office if her symptoms worsen or persist. Recheck a schedule, when necessary.

## 2011-10-02 NOTE — Patient Instructions (Addendum)
Candida Infection, Adult A candida infection (also called yeast, fungus and Monilia infection) is an overgrowth of yeast that can occur anywhere on the body. A yeast infection commonly occurs in warm, moist body areas. Usually, the infection remains localized but can spread to become a systemic infection. A yeast infection may be a sign of a more severe disease such as diabetes, leukemia, or AIDS. A yeast infection can occur in both men and women. In women, Candida vaginitis is a vaginal infection. It is one of the most common causes of vaginitis. Men usually do not have symptoms or know they have an infection until other problems develop. Men may find out they have a yeast infection because their sex partner has a yeast infection. Uncircumcised men are more likely to get a yeast infection than circumcised men. This is because the uncircumcised glans is not exposed to air and does not remain as dry as that of a circumcised glans. Older adults may develop yeast infections around dentures. CAUSES  Women  Antibiotics.   Steroid medication taken for a long time.   Being overweight (obese).   Diabetes.   Poor immune condition.   Certain serious medical conditions.   Immune suppressive medications for organ transplant patients.   Chemotherapy.   Pregnancy.   Menstration.   Stress and fatigue.   Intravenous drug use.   Oral contraceptives.   Wearing tight-fitting clothes in the crotch area.   Catching it from a sex partner who has a yeast infection.   Spermicide.   Intravenous, urinary, or other catheters.  Men  Catching it from a sex partner who has a yeast infection.   Having oral or anal sex with a person who has the infection.   Spermicide.   Diabetes.   Antibiotics.   Poor immune system.   Medications that suppress the immune system.   Intravenous drug use.   Intravenous, urinary, or other catheters.  SYMPTOMS  Women  Thick, white vaginal discharge.    Vaginal itching.   Redness and swelling in and around the vagina.   Irritation of the lips of the vagina and perineum.   Blisters on the vaginal lips and perineum.   Painful sexual intercourse.   Low blood sugar (hypoglycemia).   Painful urination.   Bladder infections.   Intestinal problems such as constipation, indigestion, bad breath, bloating, increase in gas, diarrhea, or loose stools.  Men  Men may develop intestinal problems such as constipation, indigestion, bad breath, bloating, increase in gas, diarrhea, or loose stools.   Dry, cracked skin on the penis with itching or discomfort.   Jock itch.   Dry, flaky skin.   Athlete's foot.   Hypoglycemia.  DIAGNOSIS  Women  A history and an exam are performed.   The discharge may be examined under a microscope.   A culture may be taken of the discharge.  Men  A history and an exam are performed.   Any discharge from the penis or areas of cracked skin will be looked at under the microscope and cultured.   Stool samples may be cultured.  TREATMENT  Women  Vaginal antifungal suppositories and creams.   Medicated creams to decrease irritation and itching on the outside of the vagina.   Warm compresses to the perineal area to decrease swelling and discomfort.   Oral antifungal medications.   Medicated vaginal suppositories or cream for repeated or recurrent infections.   Wash and dry the irritation areas before applying the cream.     Eating yogurt with lactobacillus may help with prevention and treatment.   Sometimes painting the vagina with gentian violet solution may help if creams and suppositories do not work.  Men  Antifungal creams and oral antifungal medications.   Sometimes treatment must continue for 30 days after the symptoms go away to prevent recurrence.  HOME CARE INSTRUCTIONS  Women  Use cotton underwear and avoid tight-fitting clothing.   Avoid colored, scented toilet paper and  deodorant tampons or pads.   Do not douche.   Keep your diabetes under control.   Finish all the prescribed medications.   Keep your skin clean and dry.   Consume milk or yogurt with lactobacillus active culture regularly. If you get frequent yeast infections and think that is what the infection is, there are over-the-counter medications that you can get. If the infection does not show healing in 3 days, talk to your caregiver.   Tell your sex partner you have a yeast infection. Your partner may need treatment also, especially if your infection does not clear up or recurs.  Men  Keep your skin clean and dry.   Keep your diabetes under control.   Finish all prescribed medications.   Tell your sex partner that you have a yeast infection so they can be treated if necessary.  SEEK MEDICAL CARE IF:   Your symptoms do not clear up or worsen in one week after treatment.   You have an oral temperature above 102 F (38.9 C).   You have trouble swallowing or eating for a prolonged time.   You develop blisters on and around your vagina.   You develop vaginal bleeding and it is not your menstrual period.   You develop abdominal pain.   You develop intestinal problems as mentioned above.   You get weak or lightheaded.   You have painful or increased urination.   You have pain during sexual intercourse.  MAKE SURE YOU:   Understand these instructions.   Will watch your condition.   Will get help right away if you are not doing well or get worse.  Document Released: 04/30/2004 Document Revised: 03/12/2011 Document Reviewed: 08/12/2009 [employer] Patient Information 2012 [employer]. 

## 2011-10-13 DIAGNOSIS — H409 Unspecified glaucoma: Secondary | ICD-10-CM | POA: Diagnosis not present

## 2011-10-13 DIAGNOSIS — H2510 Age-related nuclear cataract, unspecified eye: Secondary | ICD-10-CM | POA: Diagnosis not present

## 2011-10-13 DIAGNOSIS — H4011X0 Primary open-angle glaucoma, stage unspecified: Secondary | ICD-10-CM | POA: Diagnosis not present

## 2011-10-20 ENCOUNTER — Other Ambulatory Visit: Payer: Self-pay | Admitting: Gastroenterology

## 2011-11-16 DIAGNOSIS — H2510 Age-related nuclear cataract, unspecified eye: Secondary | ICD-10-CM | POA: Diagnosis not present

## 2011-11-16 DIAGNOSIS — H2589 Other age-related cataract: Secondary | ICD-10-CM | POA: Diagnosis not present

## 2011-11-30 ENCOUNTER — Other Ambulatory Visit: Payer: Self-pay | Admitting: *Deleted

## 2011-11-30 DIAGNOSIS — I10 Essential (primary) hypertension: Secondary | ICD-10-CM

## 2011-11-30 MED ORDER — VALSARTAN-HYDROCHLOROTHIAZIDE 80-12.5 MG PO TABS: 1.0000 | ORAL_TABLET | Freq: Every day | ORAL | Status: DC

## 2011-12-09 DIAGNOSIS — J019 Acute sinusitis, unspecified: Secondary | ICD-10-CM | POA: Diagnosis not present

## 2011-12-09 DIAGNOSIS — J3089 Other allergic rhinitis: Secondary | ICD-10-CM | POA: Diagnosis not present

## 2011-12-17 DIAGNOSIS — H409 Unspecified glaucoma: Secondary | ICD-10-CM | POA: Diagnosis not present

## 2011-12-17 DIAGNOSIS — H4011X0 Primary open-angle glaucoma, stage unspecified: Secondary | ICD-10-CM | POA: Diagnosis not present

## 2011-12-22 ENCOUNTER — Other Ambulatory Visit: Payer: Self-pay | Admitting: Family

## 2012-01-13 DIAGNOSIS — Z23 Encounter for immunization: Secondary | ICD-10-CM | POA: Diagnosis not present

## 2012-02-24 ENCOUNTER — Other Ambulatory Visit: Payer: Self-pay | Admitting: Internal Medicine

## 2012-02-24 DIAGNOSIS — Z1231 Encounter for screening mammogram for malignant neoplasm of breast: Secondary | ICD-10-CM

## 2012-03-11 ENCOUNTER — Encounter: Payer: Self-pay | Admitting: *Deleted

## 2012-03-15 ENCOUNTER — Encounter (HOSPITAL_BASED_OUTPATIENT_CLINIC_OR_DEPARTMENT_OTHER): Payer: Self-pay | Admitting: *Deleted

## 2012-03-15 ENCOUNTER — Emergency Department (HOSPITAL_BASED_OUTPATIENT_CLINIC_OR_DEPARTMENT_OTHER)
Admission: EM | Admit: 2012-03-15 | Discharge: 2012-03-15 | Disposition: A | Discharge status: Home / Self Care | Payer: Medicare Other | Attending: Emergency Medicine | Admitting: Emergency Medicine

## 2012-03-15 DIAGNOSIS — Z7982 Long term (current) use of aspirin: Secondary | ICD-10-CM | POA: Insufficient documentation

## 2012-03-15 DIAGNOSIS — R509 Fever, unspecified: Secondary | ICD-10-CM | POA: Diagnosis not present

## 2012-03-15 DIAGNOSIS — M899 Disorder of bone, unspecified: Secondary | ICD-10-CM | POA: Diagnosis not present

## 2012-03-15 DIAGNOSIS — Z862 Personal history of diseases of the blood and blood-forming organs and certain disorders involving the immune mechanism: Secondary | ICD-10-CM | POA: Diagnosis not present

## 2012-03-15 DIAGNOSIS — M129 Arthropathy, unspecified: Secondary | ICD-10-CM | POA: Insufficient documentation

## 2012-03-15 DIAGNOSIS — Z8719 Personal history of other diseases of the digestive system: Secondary | ICD-10-CM | POA: Diagnosis not present

## 2012-03-15 DIAGNOSIS — H269 Unspecified cataract: Secondary | ICD-10-CM | POA: Diagnosis not present

## 2012-03-15 DIAGNOSIS — H409 Unspecified glaucoma: Secondary | ICD-10-CM | POA: Diagnosis not present

## 2012-03-15 DIAGNOSIS — I10 Essential (primary) hypertension: Secondary | ICD-10-CM | POA: Insufficient documentation

## 2012-03-15 DIAGNOSIS — Z8639 Personal history of other endocrine, nutritional and metabolic disease: Secondary | ICD-10-CM | POA: Insufficient documentation

## 2012-03-15 DIAGNOSIS — K219 Gastro-esophageal reflux disease without esophagitis: Secondary | ICD-10-CM | POA: Diagnosis not present

## 2012-03-15 DIAGNOSIS — R112 Nausea with vomiting, unspecified: Secondary | ICD-10-CM | POA: Insufficient documentation

## 2012-03-15 DIAGNOSIS — Z79899 Other long term (current) drug therapy: Secondary | ICD-10-CM | POA: Insufficient documentation

## 2012-03-15 DIAGNOSIS — R42 Dizziness and giddiness: Secondary | ICD-10-CM | POA: Diagnosis not present

## 2012-03-15 DIAGNOSIS — R109 Unspecified abdominal pain: Secondary | ICD-10-CM | POA: Diagnosis not present

## 2012-03-15 DIAGNOSIS — R197 Diarrhea, unspecified: Secondary | ICD-10-CM

## 2012-03-15 DIAGNOSIS — E785 Hyperlipidemia, unspecified: Secondary | ICD-10-CM | POA: Insufficient documentation

## 2012-03-15 LAB — URINALYSIS, ROUTINE W REFLEX MICROSCOPIC
Bilirubin Urine: NEGATIVE
Glucose, UA: NEGATIVE mg/dL
Ketones, ur: 15 mg/dL — AB
pH: 6 (ref 5.0–8.0)

## 2012-03-15 LAB — CBC WITH DIFFERENTIAL/PLATELET
Basophils Absolute: 0 10*3/uL (ref 0.0–0.1)
Basophils Relative: 0 % (ref 0–1)
Eosinophils Absolute: 0 10*3/uL (ref 0.0–0.7)
Eosinophils Relative: 0 % (ref 0–5)
HCT: 40.1 % (ref 36.0–46.0)
Hemoglobin: 14.2 g/dL (ref 12.0–15.0)
MCH: 30.1 pg (ref 26.0–34.0)
MCHC: 35.4 g/dL (ref 30.0–36.0)
MCV: 85 fL (ref 78.0–100.0)
Monocytes Absolute: 0.4 10*3/uL (ref 0.1–1.0)
Monocytes Relative: 4 % (ref 3–12)
RDW: 13.1 % (ref 11.5–15.5)

## 2012-03-15 LAB — COMPREHENSIVE METABOLIC PANEL
AST: 41 U/L — ABNORMAL HIGH (ref 0–37)
Albumin: 3.9 g/dL (ref 3.5–5.2)
BUN: 17 mg/dL (ref 6–23)
Calcium: 9.1 mg/dL (ref 8.4–10.5)
Creatinine, Ser: 0.7 mg/dL (ref 0.50–1.10)
Total Bilirubin: 0.5 mg/dL (ref 0.3–1.2)

## 2012-03-15 LAB — LIPASE, BLOOD: Lipase: 14 U/L (ref 11–59)

## 2012-03-15 MED ORDER — FENTANYL CITRATE 0.05 MG/ML IJ SOLN
50.0000 ug | Freq: Once | INTRAMUSCULAR | Status: AC
  Administered 2012-03-15: 50 ug via INTRAVENOUS
  Filled 2012-03-15: qty 2

## 2012-03-15 MED ORDER — ONDANSETRON HCL 4 MG/2ML IJ SOLN
4.0000 mg | Freq: Once | INTRAMUSCULAR | Status: AC
  Administered 2012-03-15: 4 mg via INTRAVENOUS
  Filled 2012-03-15: qty 2

## 2012-03-15 MED ORDER — ONDANSETRON HCL 4 MG PO TABS: 4.0000 mg | ORAL_TABLET | Freq: Three times a day (TID) | ORAL | Status: DC | PRN

## 2012-03-15 MED ORDER — SODIUM CHLORIDE 0.9 % IV BOLUS (SEPSIS)
1000.0000 mL | Freq: Once | INTRAVENOUS | Status: AC
  Administered 2012-03-15: 1000 mL via INTRAVENOUS

## 2012-03-15 NOTE — ED Notes (Signed)
Diarrhea last night. No vomiting. Pale on arrival.

## 2012-03-15 NOTE — ED Provider Notes (Signed)
History     CSN: [id number]  Arrival date & time 03/15/12  1811   First MD Initiated Contact with Patient 03/15/12 1833      Chief Complaint  Patient presents with  . Diarrhea    (Consider location/radiation/quality/duration/timing/severity/associated sxs/prior treatment) HPI Pt with history of loose stools due to chronic stool incontinence reports she has had more frequent loose, diarrheal stools since last night associated with multiple episodes of vomiting (contrary to what she told triage nurse). Associated with diffuse abdominal cramping and low grade fever at home. Pt reports she felt dizzy with standing in triage. Denies CP, SOB.   Past Medical History  Diagnosis Date  . Allergy   . Hyperlipidemia   . Hypertension   . Glaucoma   . Osteopenia   . Arthritis   . Diverticulosis   . Status post dilation of esophageal narrowing   . Cataract   . GERD (gastroesophageal reflux disease)   . Thyroid disease     Past Surgical History  Procedure Date  . Abdominal hysterectomy   . Tubal ligation 1976    bilateral  . Thyroidectomy   . Bunionectomy 1985    bilateral  . Trigger finger release 1999  . Carpal tunnel release 2008  . Retinal detachment surgery 2008  . Tonsillectomy   . Knee arthroscopy 2012    gioffre  . Tonsilectomy, adenoidectomy, bilateral myringotomy and tubes   . Hiatal hernia repair     Family History  Problem Relation Age of Onset  . Diabetes Mother   . Heart disease Mother     CABG  . Heart attack Mother   . Stroke Mother   . Cancer Father     liver  . Colon cancer Maternal Grandmother   . Pancreatic cancer Maternal Grandmother   . Stomach cancer Maternal Grandmother     History  Substance Use Topics  . Smoking status: Never Smoker   . Smokeless tobacco: Never Used  . Alcohol Use: No    OB History    Grav Para Term Preterm Abortions TAB SAB Ect Mult Living                  Review of Systems All other systems reviewed and are  negative except as noted in HPI.   Allergies  Lisinopril  Home Medications   Current Outpatient Rx  Name  Route  Sig  Dispense  Refill  . ASPIRIN 81 MG PO TABS   Oral   Take 81 mg by mouth daily.           Marland Kitchen BRIMONIDINE TARTRATE 0.1 % OP SOLN   Both Eyes   Place 1 drop into both eyes 2 (two) times daily.           Marland Kitchen GNP CALCIUM 1200 1200-1000 MG-UNIT PO CHEW   Oral   Chew 1 tablet by mouth daily.           Marland Kitchen CETIRIZINE HCL 10 MG PO TABS   Oral   Take 10 mg by mouth daily.           Marland Kitchen VITAMIN D 1000 UNITS PO CAPS   Oral   Take 1,000 Units by mouth daily.           Marland Kitchen ESTROGENS CONJUGATED 0.45 MG PO TABS   Oral   Take 1 tablet (0.45 mg total) by mouth daily. Take daily for 21 days then do not take for 7 days.   30 tablet  4   . LEVOBUNOLOL HCL 0.5 % OP SOLN   Both Eyes   Place 1 drop into both eyes daily.         . MULTIVITAMIN/IRON PO TABS   Oral   Take 1 tablet by mouth daily.           Marland Kitchen PANTOPRAZOLE SODIUM 40 MG PO TBEC      TAKE 1 TABLET BY MOUTH EVERY DAY   30 tablet   5   . ALIGN 4 MG PO CAPS   Oral   Take 1 capsule by mouth daily.           . SUCRALFATE 1 G PO TABS      TAKE 1 TABLET BY MOUTH 4 TIMES A DAY   120 tablet   11   . TERCONAZOLE 0.8 % VA CREA      PLACE 1 APPLICATORFUL VAGINALLY AT BEDTIME.   20 g   0   . VALSARTAN-HYDROCHLOROTHIAZIDE 80-12.5 MG PO TABS   Oral   Take 1 tablet by mouth daily.   30 tablet   5     BP 135/72  Pulse 102  Temp 98.3 F (36.8 C) (Oral)  Resp 20  SpO2 99%  Physical Exam  Nursing note and vitals reviewed. Constitutional: She is oriented to person, place, and time. She appears well-developed and well-nourished.  HENT:  Head: Normocephalic and atraumatic.  Eyes: EOM are normal. Pupils are equal, round, and reactive to light.  Neck: Normal range of motion. Neck supple.  Cardiovascular: Normal rate, normal heart sounds and intact distal pulses.   Pulmonary/Chest: Effort normal  and breath sounds normal.  Abdominal: She exhibits no distension. There is tenderness (diffuse tenderness). There is no rebound and no guarding.       Increased bowel sounds  Musculoskeletal: Normal range of motion. She exhibits no edema and no tenderness.  Neurological: She is alert and oriented to person, place, and time. She has normal strength. No cranial nerve deficit or sensory deficit.  Skin: Skin is warm and dry. No rash noted.  Psychiatric: She has a normal mood and affect.    ED Course  Procedures (including critical care time)  Labs Reviewed  CBC WITH DIFFERENTIAL - Abnormal; Notable for the following:    Neutrophils Relative 92 (*)     Neutro Abs 9.3 (*)     Lymphocytes Relative 4 (*)     Lymphs Abs 0.4 (*)     All other components within normal limits  COMPREHENSIVE METABOLIC PANEL - Abnormal; Notable for the following:    Potassium 3.3 (*)     Glucose, Bld 200 (*)     AST 41 (*)     ALT 48 (*)     GFR calc non Af Amer 88 (*)     All other components within normal limits  URINALYSIS, ROUTINE W REFLEX MICROSCOPIC - Abnormal; Notable for the following:    APPearance CLOUDY (*)     Ketones, ur 15 (*)     All other components within normal limits  LIPASE, BLOOD  LACTIC ACID, PLASMA   No results found.   No diagnosis found.    MDM  Labs unremarkable. Pt feeling better, tolerating PO fluids.  No concern for serious intra-abdominal process including bowel obstruction, ischemic bowel, etc. Advised close PCP followup and return to the ED if symptoms worsen or for any other concerns.         Charles B. Bernette Mayers, MD 03/15/12 2057

## 2012-03-22 ENCOUNTER — Ambulatory Visit (HOSPITAL_COMMUNITY)
Admission: RE | Admit: 2012-03-22 | Discharge: 2012-03-22 | Disposition: A | Discharge status: Home / Self Care | Payer: Medicare Other | Source: Ambulatory Visit | Attending: Internal Medicine | Admitting: Internal Medicine

## 2012-03-22 DIAGNOSIS — Z1231 Encounter for screening mammogram for malignant neoplasm of breast: Secondary | ICD-10-CM | POA: Diagnosis not present

## 2012-04-05 ENCOUNTER — Other Ambulatory Visit: Payer: Self-pay | Admitting: Internal Medicine

## 2012-04-05 DIAGNOSIS — R928 Other abnormal and inconclusive findings on diagnostic imaging of breast: Secondary | ICD-10-CM

## 2012-04-12 ENCOUNTER — Ambulatory Visit
Admission: RE | Admit: 2012-04-12 | Discharge: 2012-04-12 | Disposition: A | Discharge status: Home / Self Care | Payer: Medicare Other | Source: Ambulatory Visit | Attending: Internal Medicine | Admitting: Internal Medicine

## 2012-04-12 DIAGNOSIS — R928 Other abnormal and inconclusive findings on diagnostic imaging of breast: Secondary | ICD-10-CM | POA: Diagnosis not present

## 2012-04-27 DIAGNOSIS — J309 Allergic rhinitis, unspecified: Secondary | ICD-10-CM | POA: Diagnosis not present

## 2012-04-27 DIAGNOSIS — R030 Elevated blood-pressure reading, without diagnosis of hypertension: Secondary | ICD-10-CM | POA: Diagnosis not present

## 2012-04-27 DIAGNOSIS — J069 Acute upper respiratory infection, unspecified: Secondary | ICD-10-CM | POA: Diagnosis not present

## 2012-04-27 DIAGNOSIS — R05 Cough: Secondary | ICD-10-CM | POA: Diagnosis not present

## 2012-05-03 DIAGNOSIS — H26499 Other secondary cataract, unspecified eye: Secondary | ICD-10-CM | POA: Diagnosis not present

## 2012-05-03 DIAGNOSIS — H409 Unspecified glaucoma: Secondary | ICD-10-CM | POA: Diagnosis not present

## 2012-05-03 DIAGNOSIS — H4011X0 Primary open-angle glaucoma, stage unspecified: Secondary | ICD-10-CM | POA: Diagnosis not present

## 2012-06-14 DIAGNOSIS — H26499 Other secondary cataract, unspecified eye: Secondary | ICD-10-CM | POA: Diagnosis not present

## 2012-06-14 DIAGNOSIS — H4011X0 Primary open-angle glaucoma, stage unspecified: Secondary | ICD-10-CM | POA: Diagnosis not present

## 2012-07-28 ENCOUNTER — Other Ambulatory Visit (HOSPITAL_COMMUNITY): Payer: Self-pay | Admitting: Orthopedic Surgery

## 2012-07-28 ENCOUNTER — Ambulatory Visit (HOSPITAL_COMMUNITY)
Admission: RE | Admit: 2012-07-28 | Discharge: 2012-07-28 | Disposition: A | Discharge status: Home / Self Care | Payer: Medicare Other | Source: Ambulatory Visit | Attending: Orthopedic Surgery | Admitting: Orthopedic Surgery

## 2012-07-28 DIAGNOSIS — M2420 Disorder of ligament, unspecified site: Secondary | ICD-10-CM | POA: Insufficient documentation

## 2012-07-28 DIAGNOSIS — M6289 Other specified disorders of muscle: Secondary | ICD-10-CM

## 2012-07-28 DIAGNOSIS — M7989 Other specified soft tissue disorders: Secondary | ICD-10-CM | POA: Diagnosis not present

## 2012-07-28 DIAGNOSIS — M1710 Unilateral primary osteoarthritis, unspecified knee: Secondary | ICD-10-CM | POA: Diagnosis not present

## 2012-07-28 DIAGNOSIS — Z4789 Encounter for other orthopedic aftercare: Secondary | ICD-10-CM | POA: Diagnosis not present

## 2012-07-28 DIAGNOSIS — M79609 Pain in unspecified limb: Secondary | ICD-10-CM | POA: Diagnosis not present

## 2012-07-28 DIAGNOSIS — M629 Disorder of muscle, unspecified: Secondary | ICD-10-CM | POA: Diagnosis not present

## 2012-07-28 NOTE — Progress Notes (Signed)
Venous Duplex Imaging - Limited [name] 

## 2012-08-08 ENCOUNTER — Other Ambulatory Visit: Payer: Self-pay | Admitting: Internal Medicine

## 2012-08-13 ENCOUNTER — Other Ambulatory Visit: Payer: Self-pay | Admitting: Gastroenterology

## 2012-09-18 ENCOUNTER — Other Ambulatory Visit: Payer: Self-pay | Admitting: Gastroenterology

## 2012-10-08 ENCOUNTER — Other Ambulatory Visit: Payer: Self-pay | Admitting: Gastroenterology

## 2012-10-08 ENCOUNTER — Other Ambulatory Visit: Payer: Self-pay | Admitting: Internal Medicine

## 2012-11-16 ENCOUNTER — Encounter: Payer: Medicare Other | Admitting: Internal Medicine

## 2012-11-17 ENCOUNTER — Encounter: Payer: Medicare Other | Admitting: Internal Medicine

## 2012-11-22 ENCOUNTER — Encounter: Payer: Medicare Other | Admitting: Internal Medicine

## 2012-11-23 ENCOUNTER — Other Ambulatory Visit: Payer: Self-pay | Admitting: Gastroenterology

## 2012-11-23 ENCOUNTER — Encounter: Payer: Self-pay | Admitting: Internal Medicine

## 2012-11-23 ENCOUNTER — Other Ambulatory Visit: Payer: Self-pay | Admitting: Internal Medicine

## 2012-11-23 ENCOUNTER — Ambulatory Visit (INDEPENDENT_AMBULATORY_CARE_PROVIDER_SITE_OTHER): Payer: Medicare Other | Admitting: Internal Medicine

## 2012-11-23 VITALS — BP 112/72 | HR 76 | Temp 97.8°F | Ht 63.25 in | Wt 140.0 lb

## 2012-11-23 DIAGNOSIS — K222 Esophageal obstruction: Secondary | ICD-10-CM | POA: Diagnosis not present

## 2012-11-23 DIAGNOSIS — R7309 Other abnormal glucose: Secondary | ICD-10-CM

## 2012-11-23 DIAGNOSIS — M791 Myalgia, unspecified site: Secondary | ICD-10-CM

## 2012-11-23 DIAGNOSIS — IMO0001 Reserved for inherently not codable concepts without codable children: Secondary | ICD-10-CM

## 2012-11-23 DIAGNOSIS — I10 Essential (primary) hypertension: Secondary | ICD-10-CM | POA: Diagnosis not present

## 2012-11-23 DIAGNOSIS — E785 Hyperlipidemia, unspecified: Secondary | ICD-10-CM | POA: Diagnosis not present

## 2012-11-23 DIAGNOSIS — D649 Anemia, unspecified: Secondary | ICD-10-CM

## 2012-11-23 LAB — CBC WITH DIFFERENTIAL/PLATELET
Basophils Relative: 0.3 % (ref 0.0–3.0)
Eosinophils Absolute: 0.1 10*3/uL (ref 0.0–0.7)
Hemoglobin: 13.4 g/dL (ref 12.0–15.0)
Lymphocytes Relative: 23.8 % (ref 12.0–46.0)
MCHC: 34.4 g/dL (ref 30.0–36.0)
MCV: 86.4 fl (ref 78.0–100.0)
Monocytes Absolute: 0.6 10*3/uL (ref 0.1–1.0)
Neutro Abs: 4.7 10*3/uL (ref 1.4–7.7)
RBC: 4.5 Mil/uL (ref 3.87–5.11)

## 2012-11-23 LAB — BASIC METABOLIC PANEL
BUN: 13 mg/dL (ref 6–23)
CO2: 31 mEq/L (ref 19–32)
GFR: 71.67 mL/min (ref >60.00)
Glucose, Bld: 188 mg/dL — ABNORMAL HIGH (ref 70–99)
Potassium: 4.4 mEq/L (ref 3.5–5.1)

## 2012-11-23 LAB — LDL CHOLESTEROL, DIRECT: Direct LDL: 112.7 mg/dL

## 2012-11-23 LAB — HEPATIC FUNCTION PANEL
ALT: 30 U/L (ref 0–35)
Bilirubin, Direct: 0 mg/dL (ref 0.0–0.3)
Total Bilirubin: 0.6 mg/dL (ref 0.3–1.2)

## 2012-11-23 LAB — LIPID PANEL
HDL: 42.4 mg/dL (ref >39.00)
Total CHOL/HDL Ratio: 4

## 2012-11-23 NOTE — Progress Notes (Signed)
Hyperglycemia- needs f/u  Anemia- needs f/u  Lipids- no meds but needs f/u  gerd- tolerating meds without difficulty  htn- tolerating meds  Past Medical History  Diagnosis Date  . Allergy   . Hyperlipidemia   . Hypertension   . Glaucoma   . Osteopenia   . Arthritis   . Diverticulosis   . Status post dilation of esophageal narrowing   . Cataract   . GERD (gastroesophageal reflux disease)   . Thyroid disease     History   Social History  . Marital Status: Married    Spouse Name: N/A    Number of Children: 2  . Years of Education: N/A   Occupational History  . retired    Social History Main Topics  . Smoking status: Never Smoker   . Smokeless tobacco: Never Used  . Alcohol Use: No  . Drug Use: No  . Sexual Activity: Not on file   Other Topics Concern  . Not on file   Social History Narrative   Daily caffeine    Past Surgical History  Procedure Laterality Date  . Abdominal hysterectomy    . Tubal ligation  1976    bilateral  . Thyroidectomy    . Bunionectomy  1985    bilateral  . Trigger finger release  1999  . Carpal tunnel release  2008  . Retinal detachment surgery  2008  . Tonsillectomy    . Knee arthroscopy  2012    gioffre  . Tonsilectomy, adenoidectomy, bilateral myringotomy and tubes    . Hiatal hernia repair      Family History  Problem Relation Age of Onset  . Diabetes Mother   . Heart disease Mother     CABG  . Heart attack Mother   . Stroke Mother   . Cancer Father     liver  . Colon cancer Maternal Grandmother   . Pancreatic cancer Maternal Grandmother   . Stomach cancer Maternal Grandmother     Allergies  Allergen Reactions  . Lisinopril     REACTION: hallucinations/diarrhea    Current Outpatient Prescriptions on File Prior to Visit  Medication Sig Dispense Refill  . aspirin 81 MG tablet Take 81 mg by mouth daily.        . brimonidine (ALPHAGAN P) 0.1 % SOLN Place 1 drop into both eyes 2 (two) times daily.        .  Calcium Carbonate-Vit D-Min (GNP CALCIUM 1200) 1200-1000 MG-UNIT CHEW Chew 1 tablet by mouth daily.        . cetirizine (ZYRTEC) 10 MG tablet Take 10 mg by mouth daily.        . Cholecalciferol (VITAMIN D) 1000 UNITS capsule Take 1,000 Units by mouth daily.        . Multiple Vitamins-Iron (MULTIVITAMIN/IRON) TABS Take 1 tablet by mouth daily.        . pantoprazole (PROTONIX) 40 MG tablet TAKE 1 TABLET BY MOUTH EVERY DAY  30 tablet  3  . Probiotic Product (ALIGN) 4 MG CAPS Take 1 capsule by mouth daily.        . sucralfate (CARAFATE) 1 G tablet TAKE 1 TABLET BY MOUTH 4 TIMES A DAY  120 tablet  11  . valsartan-hydrochlorothiazide (DIOVAN-HCT) 80-12.5 MG per tablet TAKE 1 TABLET BY MOUTH DAILY.  30 tablet  0   No current facility-administered medications on file prior to visit.     patient denies chest pain, shortness of breath, orthopnea. Denies lower extremity edema,  abdominal pain, change in appetite, change in bowel movements. Patient denies rashes, musculoskeletal complaints. No other specific complaints in a complete review of systems.   BP 112/72  Pulse 76  Temp(Src) 97.8 F (36.6 C) (Oral)  Ht 5' 3.25" (1.607 m)  Wt 140 lb (63.504 kg)  BMI 24.59 kg/m2   Well-developed well-nourished female in no acute distress. HEENT exam atraumatic, normocephalic, extraocular muscles are intact. Neck is supple. No jugular venous distention no thyromegaly. Chest clear to auscultation without increased work of breathing. Cardiac exam S1 and S2 are regular. Abdominal exam active bowel sounds, soft, nontender. Extremities no edema. Neurologic exam she is alert without any motor sensory deficits. Gait is normal.

## 2012-11-25 NOTE — Assessment & Plan Note (Signed)
Advised daily exercise and low CHO diet

## 2012-11-25 NOTE — Assessment & Plan Note (Signed)
Check labs Currently not on any meds

## 2012-11-25 NOTE — Assessment & Plan Note (Signed)
BP Readings from Last 3 Encounters:  11/23/12 112/72  03/15/12 135/72  10/02/11 140/70   Adequate control. Continue same meds

## 2012-11-28 ENCOUNTER — Other Ambulatory Visit: Payer: Self-pay | Admitting: *Deleted

## 2012-11-28 ENCOUNTER — Telehealth: Payer: Self-pay | Admitting: Internal Medicine

## 2012-11-28 DIAGNOSIS — E119 Type 2 diabetes mellitus without complications: Secondary | ICD-10-CM

## 2012-11-28 DIAGNOSIS — E785 Hyperlipidemia, unspecified: Secondary | ICD-10-CM

## 2012-11-28 MED ORDER — METFORMIN HCL 500 MG PO TABS: 500.0000 mg | ORAL_TABLET | Freq: Two times a day (BID) | ORAL | Status: DC

## 2012-11-28 MED ORDER — ATORVASTATIN CALCIUM 10 MG PO TABS: 10.0000 mg | ORAL_TABLET | Freq: Every day | ORAL | Status: DC

## 2012-11-28 NOTE — Telephone Encounter (Signed)
Pt would like to have her labs done again. Pt aware insurance will not pay for them again. Pt states she is not going to take the meds called in for her until she gets another report. pls advise

## 2012-11-30 NOTE — Telephone Encounter (Addendum)
Explained to pt that a hgba1c is over a 3 mth period and more than likely the number would not change.  She still requested to have it rechecked.  Pt is aware that insurance will more than likely not cover it and she still wants both lipid and hgba1c done.  appt scheduled

## 2012-11-30 NOTE — Telephone Encounter (Signed)
Ok to repeat labs.

## 2012-12-01 ENCOUNTER — Other Ambulatory Visit (INDEPENDENT_AMBULATORY_CARE_PROVIDER_SITE_OTHER): Payer: Medicare Other

## 2012-12-01 DIAGNOSIS — E785 Hyperlipidemia, unspecified: Secondary | ICD-10-CM

## 2012-12-01 DIAGNOSIS — R7309 Other abnormal glucose: Secondary | ICD-10-CM | POA: Diagnosis not present

## 2012-12-01 DIAGNOSIS — R739 Hyperglycemia, unspecified: Secondary | ICD-10-CM

## 2012-12-02 LAB — LIPID PANEL
Cholesterol: 175 mg/dL (ref 0–200)
HDL: 44.1 mg/dL (ref >39.00)
Triglycerides: 133 mg/dL (ref 0.0–149.0)
VLDL: 26.6 mg/dL (ref 0.0–40.0)

## 2012-12-07 ENCOUNTER — Telehealth: Payer: Self-pay | Admitting: Internal Medicine

## 2012-12-07 NOTE — Telephone Encounter (Signed)
Pt is aware of lab results and she refused the Lipitor.  She said statins "mess her up"  FYI

## 2012-12-07 NOTE — Telephone Encounter (Signed)
Pt called back and decided to try the Lipitor  She will pick up at pharmacy

## 2012-12-07 NOTE — Telephone Encounter (Signed)
Pls call pt w/ lab results. Someone called her.

## 2012-12-29 ENCOUNTER — Encounter: Payer: Self-pay | Admitting: Dietician

## 2012-12-29 ENCOUNTER — Encounter: Payer: Medicare Other | Attending: Internal Medicine | Admitting: Dietician

## 2012-12-29 VITALS — Ht 62.0 in | Wt 134.8 lb

## 2012-12-29 DIAGNOSIS — Z713 Dietary counseling and surveillance: Secondary | ICD-10-CM | POA: Diagnosis not present

## 2012-12-29 DIAGNOSIS — E119 Type 2 diabetes mellitus without complications: Secondary | ICD-10-CM | POA: Insufficient documentation

## 2012-12-29 NOTE — Patient Instructions (Signed)

## 2012-12-29 NOTE — Progress Notes (Signed)
Patient was seen on 12/29/2012 for the first of a series of three diabetes self-management courses at [facility].   Current HbA1c: 9.2 on 8/28  The following learning objectives were met by the patient during this course:   Defines the role of glucose and insulin  Identifies type of diabetes and pathophysiology  Defines the diagnostic criteria for diabetes and prediabetes  States the risk factors for Type 2 Diabetes  States the symptoms of Type 2 Diabetes  Defines Type 2 Diabetes treatment goals  Defines Type 2 Diabetes treatment options  States the rationale for glucose monitoring  Identifies A1C, glucose targets, and testing times  Identifies proper sharps disposal  Defines the purpose of a diabetes food plan  Identifies carbohydrate food groups  Defines effects of carbohydrate foods on glucose levels  Identifies carbohydrate choices/grams/food labels  States benefits of physical activity and effect on glucose  Review of suggested activity guidelines  Handouts given during class include:  Type 2 Diabetes: Basics Book  My Food Plan Book  Food and Activity Log  Your patient has identified their diabetes self-care support plan as:  [facility] support group  Follow-Up Plan: Attend core 2 and core 3

## 2013-01-09 ENCOUNTER — Telehealth: Payer: Self-pay | Admitting: Internal Medicine

## 2013-01-09 NOTE — Telephone Encounter (Signed)
It's the metformin. D/c the metformin- place as allergy (intolerance).  Start amaryl 1 mg po bid. #180/3 refills  Call back if diarrhea persists

## 2013-01-09 NOTE — Telephone Encounter (Signed)
Patient has been started on new meds - metFORMIN (GLUCOPHAGE) 500 MG tablet  and LIPITOR. She said she is not sure which is doing it, but she cannot control her bowels on these meds. Please call and advise.

## 2013-01-10 MED ORDER — GLIMEPIRIDE 1 MG PO TABS: 1.0000 mg | ORAL_TABLET | Freq: Every day | ORAL | Status: DC

## 2013-01-10 NOTE — Telephone Encounter (Signed)
Pt aware, rx called in, metformin added to allergy list

## 2013-01-24 ENCOUNTER — Ambulatory Visit: Payer: Medicare Other | Admitting: Gastroenterology

## 2013-01-25 ENCOUNTER — Encounter: Payer: Medicare Other | Attending: Internal Medicine | Admitting: Dietician

## 2013-01-25 DIAGNOSIS — Z713 Dietary counseling and surveillance: Secondary | ICD-10-CM | POA: Diagnosis not present

## 2013-01-25 DIAGNOSIS — E119 Type 2 diabetes mellitus without complications: Secondary | ICD-10-CM | POA: Diagnosis not present

## 2013-01-25 NOTE — Patient Instructions (Signed)
Goals:  Follow Diabetes Meal Plan as instructed  Eat 3 meals and 2 snacks, every 3-5 hrs  Limit carbohydrate intake to 30-45 grams carbohydrate/meal  Limit carbohydrate intake to 15 grams carbohydrate/snack  Add lean protein foods to meals/snacks  Monitor glucose levels as instructed by your doctor  Aim for 30 mins of physical activity daily  Bring food record and glucose log to your next nutrition visit 

## 2013-01-25 NOTE — Progress Notes (Signed)
Patient was seen on 01/25/2013 for the second of a series of three diabetes self-management courses at the [facility]. The following learning objectives were met by the patient during this class:   Describe the role of different macronutrients on glucose  Explain how carbohydrates affect blood glucose  State what foods contain the most carbohydrates  Demonstrate carbohydrate counting  Demonstrate how to read Nutrition Facts food label  Describe effects of various fats on heart health  Describe the importance of good nutrition for health and healthy eating strategies  Describe techniques for managing your shopping, cooking and meal planning  List strategies to follow meal plan when dining out  Describe the effects of alcohol on glucose and how to use it safely  Follow-Up Plan:  Attend Core 3  Work towards following your personal food plan.  

## 2013-01-30 ENCOUNTER — Ambulatory Visit (INDEPENDENT_AMBULATORY_CARE_PROVIDER_SITE_OTHER): Payer: Medicare Other

## 2013-01-30 DIAGNOSIS — Z23 Encounter for immunization: Secondary | ICD-10-CM | POA: Diagnosis not present

## 2013-02-01 ENCOUNTER — Other Ambulatory Visit: Payer: Self-pay | Admitting: Internal Medicine

## 2013-02-08 ENCOUNTER — Ambulatory Visit (INDEPENDENT_AMBULATORY_CARE_PROVIDER_SITE_OTHER): Payer: Medicare Other | Admitting: Gastroenterology

## 2013-02-08 ENCOUNTER — Encounter: Payer: Self-pay | Admitting: Gastroenterology

## 2013-02-08 VITALS — BP 124/60 | HR 84 | Ht 62.0 in | Wt 134.0 lb

## 2013-02-08 DIAGNOSIS — R159 Full incontinence of feces: Secondary | ICD-10-CM | POA: Diagnosis not present

## 2013-02-08 MED ORDER — CHOLESTYRAMINE LIGHT 4 G PO PACK: PACK | ORAL | Status: DC

## 2013-02-08 NOTE — Patient Instructions (Signed)
You have been scheduled for an appointment with ___________ at [facility]. Your appointment is on __________ at ___________. Please arrive at ______________ for registration. Make certain to bring a list of current medications, including any over the counter medications or vitamins. Also bring your co-pay if you have one as well as your insurance cards. [facility] is located at [address], [address]. Should you need to reschedule your appointment, please contact them at [phone].  

## 2013-02-08 NOTE — Progress Notes (Signed)
History of Present Illness: Mrs. Donson has returned for reevaluation of stool incontinence.  This is been a problem for several years.  She was evaluated at [facility] in the past.  She often is unaware of passing stool.  At the same time, when she has urgency to defecate she cannot control her bowels.  Stools generally are loose.  Therapy with Carafate was unsuccessful.  Colonoscopy in 2011 demonstrated a polyp, diverticulosis and internal hemorrhoids  and pathology demonstrated a tubular adenoma.  She underwent dilation of a distal esophageal stricture and 2012.    Past Medical History  Diagnosis Date  . Allergy   . Hyperlipidemia   . Hypertension   . Glaucoma   . Osteopenia   . Arthritis   . Diverticulosis   . Status post dilation of esophageal narrowing   . Cataract   . GERD (gastroesophageal reflux disease)   . Thyroid disease   . Diabetes    Past Surgical History  Procedure Laterality Date  . Abdominal hysterectomy    . Tubal ligation  1976    bilateral  . Thyroidectomy    . Bunionectomy  1985    bilateral  . Trigger finger release  1999  . Carpal tunnel release  2008  . Retinal detachment surgery  2008  . Tonsillectomy    . Knee arthroscopy  2012    gioffre  . Tonsilectomy, adenoidectomy, bilateral myringotomy and tubes    . Hiatal hernia repair     family history includes Cancer in her father; Colon cancer in her maternal grandmother; Diabetes in her mother; Heart attack in her mother; Heart disease in her mother; Pancreatic cancer in her maternal grandmother; Stomach cancer in her maternal grandmother; Stroke in her mother. Current Outpatient Prescriptions  Medication Sig Dispense Refill  . aspirin 81 MG tablet Take 81 mg by mouth daily.        . brimonidine (ALPHAGAN P) 0.1 % SOLN Place 1 drop into both eyes 2 (two) times daily.        . Calcium Carbonate-Vit D-Min (GNP CALCIUM 1200) 1200-1000 MG-UNIT CHEW Chew 1 tablet by mouth daily.        . cetirizine (ZYRTEC) 10  MG tablet Take 10 mg by mouth daily.        . Cholecalciferol (VITAMIN D) 1000 UNITS capsule Take 1,000 Units by mouth daily.        Marland Kitchen glimepiride (AMARYL) 1 MG tablet Take 1 tablet (1 mg total) by mouth daily before breakfast.  180 tablet  3  . latanoprost (XALATAN) 0.005 % ophthalmic solution 1 drop at bedtime.      . Multiple Vitamins-Iron (MULTIVITAMIN/IRON) TABS Take 1 tablet by mouth daily.        . Probiotic Product (ALIGN) 4 MG CAPS Take 1 capsule by mouth daily.        . valsartan-hydrochlorothiazide (DIOVAN-HCT) 80-12.5 MG per tablet TAKE 1 TABLET BY MOUTH DAILY.  30 tablet  0   No current facility-administered medications for this visit.   Allergies as of 02/08/2013 - Review Complete 02/08/2013  Allergen Reaction Noted  . Lisinopril  10/16/2008  . Metformin and related Diarrhea 01/10/2013    reports that she has never smoked. She has never used smokeless tobacco. She reports that she does not drink alcohol or use illicit drugs.     Review of Systems: Pertinent positive and negative review of systems were noted in the above HPI section. All other review of systems were otherwise negative.  Vital  signs were reviewed in today's medical record Physical Exam: General: Well developed , well nourished, no acute distress Skin: anicteric Head: Normocephalic and atraumatic Eyes:  sclerae anicteric, EOMI Ears: Normal auditory acuity Mouth: No deformity or lesions Neck: Supple, no masses or thyromegaly Lungs: Clear throughout to auscultation Heart: Regular rate and rhythm; no murmurs, rubs or bruits Abdomen: Soft, non tender and non distended. No masses, hepatosplenomegaly or hernias noted. Normal Bowel sounds Rectal: Rectal tone is normal.  There no masses.  Stool is Hemoccult negative Musculoskeletal: Symmetrical with no gross deformities  Skin: No lesions on visible extremities Pulses:  Normal pulses noted Extremities: No clubbing, cyanosis, edema or deformities  noted Neurological: Alert oriented x 4, grossly nonfocal Cervical Nodes:  No significant cervical adenopathy Inguinal Nodes: No significant inguinal adenopathy Psychological:  Alert and cooperative. Normal mood and affect

## 2013-02-08 NOTE — Assessment & Plan Note (Signed)
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  She has both inability to control her bowels and passage of stool without awareness.  Recommendations #1 fiber supplementation daily with Citrucel #2 begin cholestyramine 2-4 packets daily #3 refer to Dr. Romie Levee for consideration of local injection therapy with [facility]

## 2013-02-09 ENCOUNTER — Encounter: Payer: Medicare Other | Attending: Internal Medicine

## 2013-02-09 DIAGNOSIS — Z713 Dietary counseling and surveillance: Secondary | ICD-10-CM | POA: Diagnosis not present

## 2013-02-09 DIAGNOSIS — E119 Type 2 diabetes mellitus without complications: Secondary | ICD-10-CM | POA: Insufficient documentation

## 2013-02-14 ENCOUNTER — Encounter: Payer: Self-pay | Admitting: Nurse Practitioner

## 2013-02-14 ENCOUNTER — Ambulatory Visit (INDEPENDENT_AMBULATORY_CARE_PROVIDER_SITE_OTHER): Payer: Medicare Other | Admitting: Nurse Practitioner

## 2013-02-14 ENCOUNTER — Ambulatory Visit (INDEPENDENT_AMBULATORY_CARE_PROVIDER_SITE_OTHER)
Admission: RE | Admit: 2013-02-14 | Discharge: 2013-02-14 | Disposition: A | Discharge status: Home / Self Care | Payer: Medicare Other | Source: Ambulatory Visit | Attending: Nurse Practitioner | Admitting: Nurse Practitioner

## 2013-02-14 ENCOUNTER — Telehealth: Payer: Self-pay | Admitting: Nurse Practitioner

## 2013-02-14 VITALS — BP 129/67 | HR 68 | Temp 98.0°F | Resp 18 | Ht 63.25 in | Wt 133.0 lb

## 2013-02-14 DIAGNOSIS — M79609 Pain in unspecified limb: Secondary | ICD-10-CM | POA: Diagnosis not present

## 2013-02-14 DIAGNOSIS — R229 Localized swelling, mass and lump, unspecified: Secondary | ICD-10-CM | POA: Diagnosis not present

## 2013-02-14 DIAGNOSIS — R2232 Localized swelling, mass and lump, left upper limb: Secondary | ICD-10-CM

## 2013-02-14 MED ORDER — DICLOFENAC SODIUM 1 % TD GEL: TRANSDERMAL | Status: DC

## 2013-02-14 NOTE — Progress Notes (Signed)
Subjective:    [name] is a 67 y.o. female who presents for evaluation of left thumb pain. She noticed a nodule that started to grow about 6 weeks ago. Onset was gradual, starting about 6 weeks ago. The pain is moderate, interfering with daily activities; worsens with movement, and is relieved by rest. There is no associated numbness, tingling in the hand or thumb, but she c/o grip strength weakness. Evaluation to date: none. Treatment to date: nothing specific.  The following portions of the patient's history were reviewed and updated as appropriate: allergies, current medications, past medical history and problem list.  Review of Systems Constitutional: negative for fatigue and fevers Cardiovascular: negative, not assessed Musculoskeletal:positive for pain in hands, worse with activity, weakened grip strength, negative for stiff joints Neurological: negative for paresthesia    Objective:    BP 129/67  Pulse 68  Temp(Src) 98 F (36.7 C) (Temporal)  Resp 18  Ht 5' 3.25" (1.607 m)  Wt 133 lb (60.328 kg)  BMI 23.36 kg/m2  SpO2 98% Right hand:  osteoarthritic changes  Left hand:  osteoarthritic changes and hard nodule medial aspect of L MP joint   Imaging: X-ray both hands: R no freacture, dislocation or swelling R hand. L hand shows subchondral cyst MP joint  Assessment:    Subchondral cyst c/w osteoarthritis  Labs not necessary after viewing xrays, as no evidence of inflammatory arthritis, calcinosis.   Plan:    Natural history and expected course discussed. Questions answered. NSAIDs per medication orders. will refer to hand specialist or rheum if she wishes.

## 2013-02-14 NOTE — Patient Instructions (Signed)
Start using voltaren gel on nodule. Our office will call you regarding labs & xrays, and appropriate follow up. Pleasure to meet you!

## 2013-02-14 NOTE — Telephone Encounter (Signed)
See routing note. Pt does need labs. xrays r/o inflammatory arthritis. Labs cancelled.

## 2013-02-16 NOTE — Progress Notes (Signed)
Patient was seen on 02/09/13 for the third of a series of three diabetes self-management courses at the [facility]. The following learning objectives were met by the patient during this class:    State the amount of activity recommended for healthy living   Describe activities suitable for individual needs   Identify ways to regularly incorporate activity into daily life   Identify barriers to activity and ways to over come these barriers  Identify diabetes medications being personally used and their primary action for lowering glucose and possible side effects   Describe role of stress on blood glucose and develop strategies to address psychosocial issues   Identify diabetes complications and ways to prevent them  Explain how to manage diabetes during illness   Evaluate success in meeting personal goal   Establish 2-3 goals that they will plan to diligently work on until they return for the 35-month follow-up visit  Your patient has established the following 4 month goals in their individualized success plan:  Count Carbs at most meals and snacks  Reduce fat in diet  Increase activity to 30 minutes 4 or more times a week  Take my medication as scheduled  Look for patterns in my record book at least 4 days a month   Your patient has identified these potential barriers to change:  None noted  Your patient has identified their diabetes self-care support plan as  [facility] support group

## 2013-02-20 ENCOUNTER — Telehealth: Payer: Self-pay | Admitting: *Deleted

## 2013-02-20 NOTE — Telephone Encounter (Signed)
Called patient and [facility] pharmacy to let them know that has been approved for the Voltarin Gel rx.

## 2013-02-22 ENCOUNTER — Encounter (INDEPENDENT_AMBULATORY_CARE_PROVIDER_SITE_OTHER): Payer: Self-pay | Admitting: General Surgery

## 2013-02-22 ENCOUNTER — Ambulatory Visit (INDEPENDENT_AMBULATORY_CARE_PROVIDER_SITE_OTHER): Payer: Medicare Other | Admitting: General Surgery

## 2013-02-22 VITALS — BP 130/70 | HR 76 | Resp 16 | Ht 63.0 in | Wt 132.8 lb

## 2013-02-22 DIAGNOSIS — R159 Full incontinence of feces: Secondary | ICD-10-CM | POA: Diagnosis not present

## 2013-02-22 NOTE — Patient Instructions (Addendum)
Bowel Incontinence  What is incontinence? Incontinence is the impaired ability to control gas or stool. Its severity ranges from mild difficulty with gas control to severe loss of control over liquid and formed stools. Incontinence to stool is a common problem, but often it is not discussed due to embarassment. What causes incontinence? There are many causes of incontinence. Injury during childbirth is one of the most common causes. These injuries may cause a tear in the anal muscles. The nerves supplying the anal muscles may also be injured. While some injuries may be recognized immediately following childbirth, many others may go unnoticed and not become a problem until later in life. In these situations, a prior childbirth may not be recognized as the cause of incontinence. Anal operations or traumatic injury to the tissue surrounding the anal region similarly can damage the anal muscles and hinder bowel control. Some individuals experience loss of strength in the anal muscles as they age. As a result, a minor control problem in a younger person may become more significant later in life. Diarrhea may be associated with a feeling of urgency or stool leakage due to the frequent liquid stools passing through the anal opening. If bleeding accompanies your bowel movements or you have lack of bowel control, consult your physician. These symptoms may indicate inflammation within the colon (colitis), a rectal tumor or rectal prolapse - all conditions that require prompt evaluation by a physician.    How is the cause of incontinence determined? An initial discussion of the problem with your physician will help establish the degree of control difficulty and its impact on your lifestyle. Many clues to the origin of incontinence may be found in patient histories. For example, a woman's history of past childbirths is very important. Multiple pregnancies, large weight babies, forceps deliveries, or episiotomies  may contribute to muscle or nerve injury at the time of childbirth. In some cases, medical illnesses and medications play a role in problems with control. A physical exam of the anal region should be performed. It may readily identify an obvious injury to the anal muscles. In addition, an ultrasound probe can be used within the anal area to provide a picture of the muscles and show areas in which the anal muscles have been injured. Frequently, additional studies are required to define the anal area more completely. In a test called anal manometry, a small catheter is placed into the anus to record pressure as patients relax and tighten the anal muscles. This test can demonstrate how weak or strong the muscle really is. A separate test may also be conducted to determine if the nerves that go to the anal muscles are functioning properly. What can be done to correct the problem? Treatment of incontinence may include: .     Dietary changes .     Constipating medications .     Muscle strengthening exercises .     Biofeedback  Injectable bulking agents  Surgical muscle repair  Artificial anal sphincter  Sacral nerve stimulator  After a careful history, physical examination and testing to determine the cause and severity of the problem, treatment can be addressed. Mild problems may be treated very simply with dietary changes and the use of some constipating medications. Diseases which cause inflammation in the rectum, such as colitis, may contribute to anal control problems. Treating these diseases also may eliminate or improve symptoms of incontinence. Sometimes a change in prescribed medications may help. Your physician also may recommend simple home exercises that may   strengthen the anal muscles to help in mild cases. A type of physical therapy called biofeedback can be used to help patients sense when stool is ready to be evacuated and help strengthen the muscles. Injuries to the anal muscles may be  repaired with surgery. Some individuals may benefit from a technique that delivers electrical energy to the skin and muscles surrounding the anus which results in firming and thickening of this area to help with continence. In certain individuals that have nerve damage or anal muscles that are damaged beyond repair, an artificial sphincter may be implanted. The artificial sphincter is a plastic, fluid filled doughnut that is surgically implanted around the damaged anal sphincter. This artificial sphincter keeps the anal canal closed. When an individual wants to have a bowel movement, the fluid can be pumped out of the doughnut to allow the anal canal to open. In extreme cases, patients may find that a colostomy is the best option for improving their quality of life. What is a colon and rectal surgeon? Colon and rectal surgeons are experts in the surgical and non-surgical treatment of diseases of the colon, rectum and anus. They have completed advanced surgical training in the treatment of these diseases as well as full general surgical training. Board-certified colon and rectal surgeons complete residencies in general surgery and colon and rectal surgery, and pass intensive examinations conducted by the American Board of Surgery and the American Board of Colon and Rectal Surgery. They are well-versed in the treatment of both benign and malignant diseases of the colon, rectum and anus and are able to perform routine screening examinations and surgically treat conditions if indicated to do so.  2012 American Society of Colon & Rectal Surgeons     GETTING TO GOOD BOWEL HEALTH. Irregular bowel habits such as diarrhea can lead to many problems over time.  Having one soft, formed bowel movement a day is the most important way to prevent further problems.  The anorectal canal is designed to handle stretching and feces to safely manage our ability to get rid of solid waste (feces, poop, stool) out of our body.   BUT, diarrhea can be a burning fire to this very sensitive area of our body, causing inflamed hemorrhoids, anal fissures, increasing risk is perirectal abscesses, abdominal pain and bloating.     The goal: ONE SOFT BOWEL MOVEMENT A DAY!  To have soft, regular bowel movements:  o Take plenty of fiber. By using Bulking Agents -- Methylcellulose -- This is another fiber derived from wood which also retains water. It is available as Citrucel. Adding fiber gradually can help thicken stools by absorbing excess fluid and retrain the intestines to act more normally.  Slowly increase the dose over a few weeks.  Too much fiber too soon can backfire and cause cramping & bloating.   Controlling loose stools o Switch to liquids and simpler foods for a few days to avoid stressing your intestines further. o Avoid dairy products (especially milk & ice cream) for a short time.  The intestines often can lose the ability to digest lactose when stressed. o Avoid foods that cause gassiness or bloating.  Typical foods include beans and other legumes, cabbage, broccoli, and dairy foods.  Every person has some sensitivity to other foods, so listen to our body and avoid those foods that trigger problems for you. o Probiotics (such as active yogurt, Align, etc) may help repopulate the intestines and colon with normal bacteria and calm down a sensitive digestive tract.  Most studies show it to be of mild help, though, and such products can be costly. o Medicines:   Loperamide (Immodium) can slow down diarrhea.  Start with two tablets (4mg  total) first and then try one tablet every 6 hours.  Avoid if you are having fevers or severe pain.  If you are not better or start feeling worse, stop all medicines and call your doctor for advice o Call your doctor if you are getting worse or not better.  Sometimes further testing (cultures, endoscopy, X-ray studies, bloodwork, etc) may be needed to help diagnose and treat the cause of the  diarrhea.

## 2013-02-22 NOTE — Progress Notes (Signed)
Chief Complaint  Patient presents with  . New Evaluation    eval stool incontinence    HISTORY: [name] is a 67 y.o. female who presents to the office with fecal incontinence.  This had been occurring for 12 years.   Spicy food makes the symptoms worse.  She reports that she had 3-4 episodes a day, and she does not feel that she needs to defecate before that.  Since she has started cholestyramine, her leakage got slightly better, and her stools are more solid.   It is intermittent in nature.    Her bowel habits are irregular and her bowel movements are loose to soft.  Her fiber intake is dietary.  Her last colonoscopy was in 2011, and she is due again in 2016.  She has had 2 vaginal deliveries, and denies any tearing but did have prolonged labor with her 2nd child.  She is incontinent to gas, liquid and solid.  Past Medical History  Diagnosis Date  . Allergy   . Hyperlipidemia   . Hypertension   . Glaucoma   . Osteopenia   . Arthritis   . Diverticulosis   . Status post dilation of esophageal narrowing   . Cataract   . GERD (gastroesophageal reflux disease)   . Thyroid disease   . Diabetes       Past Surgical History  Procedure Laterality Date  . Abdominal hysterectomy    . Tubal ligation  1976    bilateral  . Thyroidectomy    . Bunionectomy  1985    bilateral  . Trigger finger release  1999  . Carpal tunnel release  2008  . Retinal detachment surgery  2008  . Tonsillectomy    . Knee arthroscopy  2012    gioffre  . Tonsilectomy, adenoidectomy, bilateral myringotomy and tubes    . Hiatal hernia repair          Current Outpatient Prescriptions  Medication Sig Dispense Refill  . aspirin 81 MG tablet Take 81 mg by mouth daily.        . brimonidine (ALPHAGAN P) 0.1 % SOLN Place 1 drop into both eyes 2 (two) times daily.        . Calcium Carbonate-Vit D-Min (GNP CALCIUM 1200) 1200-1000 MG-UNIT CHEW Chew 1 tablet by mouth daily.        . cetirizine (ZYRTEC) 10 MG  tablet Take 10 mg by mouth daily.        . Cholecalciferol (VITAMIN D) 1000 UNITS capsule Take 1,000 Units by mouth daily.        . cholestyramine light (PREVALITE) 4 G packet Take one packet 2-4 times a day  30 packet  5  . diclofenac sodium (VOLTAREN) 1 % GEL Rub on small amount to L thumb joint 2-3 times daily  100 g  1  . glimepiride (AMARYL) 1 MG tablet Take 1 tablet (1 mg total) by mouth daily before breakfast.  180 tablet  3  . latanoprost (XALATAN) 0.005 % ophthalmic solution 1 drop at bedtime.      . Multiple Vitamins-Iron (MULTIVITAMIN/IRON) TABS Take 1 tablet by mouth daily.        . Probiotic Product (ALIGN) 4 MG CAPS Take 1 capsule by mouth daily.        . sucralfate (CARAFATE) 1 G tablet Take 1 g by mouth 4 (four) times daily -  with meals and at bedtime.      . valsartan-hydrochlorothiazide (DIOVAN-HCT) 80-12.5 MG per tablet TAKE 1   TABLET BY MOUTH DAILY.  30 tablet  0   No current facility-administered medications for this visit.      Allergies  Allergen Reactions  . Lisinopril     REACTION: hallucinations/diarrhea  . Metformin And Related Diarrhea      Family History  Problem Relation Age of Onset  . Diabetes Mother   . Heart disease Mother     CABG  . Heart attack Mother   . Stroke Mother   . Cancer Father     liver  . Colon cancer Maternal Grandmother   . Pancreatic cancer Maternal Grandmother   . Stomach cancer Maternal Grandmother     History   Social History  . Marital Status: Married    Spouse Name: N/A    Number of Children: 2  . Years of Education: N/A   Occupational History  . retired    Social History Main Topics  . Smoking status: Never Smoker   . Smokeless tobacco: Never Used  . Alcohol Use: No  . Drug Use: No  . Sexual Activity: None   Other Topics Concern  . None   Social History Narrative   Daily caffeine      REVIEW OF SYSTEMS - PERTINENT POSITIVES ONLY: Review of Systems - General ROS: negative for - chills, fever or weight  loss Hematological and Lymphatic ROS: negative for - bleeding problems, blood clots or bruising Respiratory ROS: no cough, shortness of breath, or wheezing Cardiovascular ROS: no chest pain or dyspnea on exertion Gastrointestinal ROS: no abdominal pain, change in bowel habits, or black or bloody stools Genito-Urinary ROS: no dysuria, trouble voiding, or hematuria  EXAM: Filed Vitals:   02/22/13 1456  BP: 130/70  Pulse: 76  Resp: 16    General appearance: alert and cooperative Resp: clear to auscultation bilaterally Cardio: regular rate and rhythm GI: normal findings: soft, non-tender   Procedure: Anoscopy Surgeon: Alexandru Moorer Diagnosis: fecal incontinence   Assistant: Glaspey After the risks and benefits were explained, verbal consent was obtained for above procedure  Anesthesia: none Findings: good rectal tone, no squeeze, good push, mild-moderate rectocele    ASSESSMENT AND PLAN: [name] is a 67 y.o. F with fecal incontinence.  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  We will start with adding a fiber supplement to the cholestyramine and performing anorectal manometry and US.  I will see her back in 8 weeks to evaluate her progress on fiber therapy and review her manometry.  I think she would be a good candidate for SNS if conservative therapy fails.        Kyden Potash C Lillia Lengel, MD Colon and Rectal Surgery / General Surgery [facility]      Visit Diagnoses: 1. Fecal incontinence     Primary Care Physician: SWORDS,BRUCE HENRY, MD   

## 2013-02-22 NOTE — Addendum Note (Signed)
Addended by: Vanita Panda on: 02/22/2013 03:41 PM   Modules accepted: Orders

## 2013-03-17 DIAGNOSIS — J00 Acute nasopharyngitis [common cold]: Secondary | ICD-10-CM | POA: Diagnosis not present

## 2013-03-17 DIAGNOSIS — R05 Cough: Secondary | ICD-10-CM | POA: Diagnosis not present

## 2013-03-20 ENCOUNTER — Encounter (HOSPITAL_COMMUNITY)
Admission: RE | Disposition: A | Discharge status: Home / Self Care | Payer: Self-pay | Source: Ambulatory Visit | Attending: General Surgery

## 2013-03-20 ENCOUNTER — Encounter (HOSPITAL_COMMUNITY): Payer: Self-pay | Admitting: *Deleted

## 2013-03-20 ENCOUNTER — Ambulatory Visit (HOSPITAL_COMMUNITY)
Admission: RE | Admit: 2013-03-20 | Discharge: 2013-03-20 | Disposition: A | Discharge status: Home / Self Care | Payer: Medicare Other | Source: Ambulatory Visit | Attending: General Surgery | Admitting: General Surgery

## 2013-03-20 DIAGNOSIS — E119 Type 2 diabetes mellitus without complications: Secondary | ICD-10-CM | POA: Diagnosis not present

## 2013-03-20 DIAGNOSIS — K219 Gastro-esophageal reflux disease without esophagitis: Secondary | ICD-10-CM | POA: Diagnosis not present

## 2013-03-20 DIAGNOSIS — E785 Hyperlipidemia, unspecified: Secondary | ICD-10-CM | POA: Insufficient documentation

## 2013-03-20 DIAGNOSIS — Z79899 Other long term (current) drug therapy: Secondary | ICD-10-CM | POA: Diagnosis not present

## 2013-03-20 DIAGNOSIS — Z90710 Acquired absence of both cervix and uterus: Secondary | ICD-10-CM | POA: Diagnosis not present

## 2013-03-20 DIAGNOSIS — I10 Essential (primary) hypertension: Secondary | ICD-10-CM | POA: Diagnosis not present

## 2013-03-20 DIAGNOSIS — R159 Full incontinence of feces: Secondary | ICD-10-CM | POA: Diagnosis not present

## 2013-03-20 DIAGNOSIS — Z7982 Long term (current) use of aspirin: Secondary | ICD-10-CM | POA: Diagnosis not present

## 2013-03-20 HISTORY — PX: RECTAL ULTRASOUND: SHX2306

## 2013-03-20 HISTORY — PX: ANAL RECTAL MANOMETRY: SHX6358

## 2013-03-20 LAB — GLUCOSE, CAPILLARY: Glucose-Capillary: 97 mg/dL (ref 70–99)

## 2013-03-20 SURGERY — MANOMETRY, ANORECTAL

## 2013-03-20 SURGERY — US RECTUM

## 2013-03-20 MED ORDER — FLEET ENEMA 7-19 GM/118ML RE ENEM: 1.0000 | ENEMA | Freq: Once | RECTAL | Status: DC

## 2013-03-20 NOTE — Op Note (Signed)
03/20/2013  10:25 AM  PATIENT:  [name]  67 y.o. female  Patient Care Team: Lindley Magnus, MD as PCP - General  PRE-OPERATIVE DIAGNOSIS:  fecal incontinence  POST-OPERATIVE DIAGNOSIS: fecal incontinence   PROCEDURE:  ANAL ULTRASOUND  SURGEON:  Surgeon(s): Romie Levee, MD  ANESTHESIA:   none  EBL: none    INDICATION: fecal incontinence with every bowel movement  OR FINDINGS: intact internal and external sphincters  DESCRIPTION: The patient was identified in the holding area and taken to the procedure room where they were laid in lateral decubitus position on the exam room table.  A timeout was performed indicating the correct patient and procedure.  I began by performing a digital rectal exam.  There were no abnormal masses.   The US probe was inserted without difficulty.  The levators were identified and the probe was withdrawn slowly.  The internal sphincter was identified and followed distally. It was completely intact.  The external sphincter was also identified and followed distally. It was also intact. The probe was withdrawn and the procedure was terminated.

## 2013-03-20 NOTE — H&P (View-Only) (Signed)
Chief Complaint  Patient presents with  . New Evaluation    eval stool incontinence    HISTORY: [name] is a 67 y.o. female who presents to the office with fecal incontinence.  This had been occurring for 12 years.   Spicy food makes the symptoms worse.  She reports that she had 3-4 episodes a day, and she does not feel that she needs to defecate before that.  Since she has started cholestyramine, her leakage got slightly better, and her stools are more solid.   It is intermittent in nature.    Her bowel habits are irregular and her bowel movements are loose to soft.  Her fiber intake is dietary.  Her last colonoscopy was in 2011, and she is due again in 2016.  She has had 2 vaginal deliveries, and denies any tearing but did have prolonged labor with her 2nd child.  She is incontinent to gas, liquid and solid.  Past Medical History  Diagnosis Date  . Allergy   . Hyperlipidemia   . Hypertension   . Glaucoma   . Osteopenia   . Arthritis   . Diverticulosis   . Status post dilation of esophageal narrowing   . Cataract   . GERD (gastroesophageal reflux disease)   . Thyroid disease   . Diabetes       Past Surgical History  Procedure Laterality Date  . Abdominal hysterectomy    . Tubal ligation  1976    bilateral  . Thyroidectomy    . Bunionectomy  1985    bilateral  . Trigger finger release  1999  . Carpal tunnel release  2008  . Retinal detachment surgery  2008  . Tonsillectomy    . Knee arthroscopy  2012    gioffre  . Tonsilectomy, adenoidectomy, bilateral myringotomy and tubes    . Hiatal hernia repair          Current Outpatient Prescriptions  Medication Sig Dispense Refill  . aspirin 81 MG tablet Take 81 mg by mouth daily.        . brimonidine (ALPHAGAN P) 0.1 % SOLN Place 1 drop into both eyes 2 (two) times daily.        . Calcium Carbonate-Vit D-Min (GNP CALCIUM 1200) 1200-1000 MG-UNIT CHEW Chew 1 tablet by mouth daily.        . cetirizine (ZYRTEC) 10 MG  tablet Take 10 mg by mouth daily.        . Cholecalciferol (VITAMIN D) 1000 UNITS capsule Take 1,000 Units by mouth daily.        . cholestyramine light (PREVALITE) 4 G packet Take one packet 2-4 times a day  30 packet  5  . diclofenac sodium (VOLTAREN) 1 % GEL Rub on small amount to L thumb joint 2-3 times daily  100 g  1  . glimepiride (AMARYL) 1 MG tablet Take 1 tablet (1 mg total) by mouth daily before breakfast.  180 tablet  3  . latanoprost (XALATAN) 0.005 % ophthalmic solution 1 drop at bedtime.      . Multiple Vitamins-Iron (MULTIVITAMIN/IRON) TABS Take 1 tablet by mouth daily.        . Probiotic Product (ALIGN) 4 MG CAPS Take 1 capsule by mouth daily.        . sucralfate (CARAFATE) 1 G tablet Take 1 g by mouth 4 (four) times daily -  with meals and at bedtime.      . valsartan-hydrochlorothiazide (DIOVAN-HCT) 80-12.5 MG per tablet TAKE 1  TABLET BY MOUTH DAILY.  30 tablet  0   No current facility-administered medications for this visit.      Allergies  Allergen Reactions  . Lisinopril     REACTION: hallucinations/diarrhea  . Metformin And Related Diarrhea      Family History  Problem Relation Age of Onset  . Diabetes Mother   . Heart disease Mother     CABG  . Heart attack Mother   . Stroke Mother   . Cancer Father     liver  . Colon cancer Maternal Grandmother   . Pancreatic cancer Maternal Grandmother   . Stomach cancer Maternal Grandmother     History   Social History  . Marital Status: Married    Spouse Name: N/A    Number of Children: 2  . Years of Education: N/A   Occupational History  . retired    Social History Main Topics  . Smoking status: Never Smoker   . Smokeless tobacco: Never Used  . Alcohol Use: No  . Drug Use: No  . Sexual Activity: None   Other Topics Concern  . None   Social History Narrative   Daily caffeine      REVIEW OF SYSTEMS - PERTINENT POSITIVES ONLY: Review of Systems - General ROS: negative for - chills, fever or weight  loss Hematological and Lymphatic ROS: negative for - bleeding problems, blood clots or bruising Respiratory ROS: no cough, shortness of breath, or wheezing Cardiovascular ROS: no chest pain or dyspnea on exertion Gastrointestinal ROS: no abdominal pain, change in bowel habits, or black or bloody stools Genito-Urinary ROS: no dysuria, trouble voiding, or hematuria  EXAM: Filed Vitals:   02/22/13 1456  BP: 130/70  Pulse: 76  Resp: 16    General appearance: alert and cooperative Resp: clear to auscultation bilaterally Cardio: regular rate and rhythm GI: normal findings: soft, non-tender   Procedure: Anoscopy Surgeon: Maisie Fus Diagnosis: fecal incontinence   Assistant: Christella Scheuermann After the risks and benefits were explained, verbal consent was obtained for above procedure  Anesthesia: none Findings: good rectal tone, no squeeze, good push, mild-moderate rectocele    ASSESSMENT AND PLAN: [name] is a 67 y.o. F with fecal incontinence.  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  We will start with adding a fiber supplement to the cholestyramine and performing anorectal manometry and [place].  I will see her back in 8 weeks to evaluate her progress on fiber therapy and review her manometry.  I think she would be a good candidate for SNS if conservative therapy fails.        Vanita Panda, MD Colon and Rectal Surgery / General Surgery [facility]      Visit Diagnoses: 1. Fecal incontinence     Primary Care Physician: Judie Petit, MD

## 2013-03-20 NOTE — Interval H&P Note (Signed)
History and Physical Interval Note:  03/20/2013 10:00 AM  [name]  has presented today for surgery, with the diagnosis of fecal incontinonce  The various methods of treatment have been discussed with the patient and family. After consideration of risks, benefits and other options for treatment, the patient has consented to  Procedure(s): RECTAL ULTRASOUND (N/A) as a surgical intervention .  The patient's history has been reviewed, patient examined, no change in status, stable for surgery.  I have reviewed the patient's chart and labs.  Questions were answered to the patient's satisfaction.    Vanita Panda, MD  Colorectal and General Surgery [facility]

## 2013-03-21 ENCOUNTER — Encounter (HOSPITAL_COMMUNITY): Payer: Self-pay | Admitting: General Surgery

## 2013-03-23 ENCOUNTER — Ambulatory Visit (INDEPENDENT_AMBULATORY_CARE_PROVIDER_SITE_OTHER): Payer: Medicare Other | Admitting: General Surgery

## 2013-04-03 ENCOUNTER — Encounter: Payer: Self-pay | Admitting: Emergency Medicine

## 2013-04-03 ENCOUNTER — Emergency Department (INDEPENDENT_AMBULATORY_CARE_PROVIDER_SITE_OTHER)
Admission: EM | Admit: 2013-04-03 | Discharge: 2013-04-03 | Disposition: A | Discharge status: Home / Self Care | Payer: Medicare Other | Source: Home / Self Care | Attending: Family Medicine | Admitting: Family Medicine

## 2013-04-03 DIAGNOSIS — J111 Influenza due to unidentified influenza virus with other respiratory manifestations: Secondary | ICD-10-CM | POA: Diagnosis not present

## 2013-04-03 MED ORDER — OSELTAMIVIR PHOSPHATE 75 MG PO CAPS: 75.0000 mg | ORAL_CAPSULE | Freq: Two times a day (BID) | ORAL | Status: DC

## 2013-04-03 MED ORDER — BENZONATATE 200 MG PO CAPS: 200.0000 mg | ORAL_CAPSULE | Freq: Every day | ORAL | Status: DC

## 2013-04-03 NOTE — ED Notes (Signed)
Productive cough, fever, congestion x 2 days

## 2013-04-03 NOTE — ED Provider Notes (Signed)
CSN: [id number]     Arrival date & time 04/03/13  1308 History   First MD Initiated Contact with Patient 04/03/13 1512     Chief Complaint  Patient presents with  . Cough      HPI Comments: Two days ago patient developed rather sudden onset of headache, productive cough, sinus congestion, fatigue, myalgias, chills and sore throat.  She has had influenza immunization for this season.    Past Medical History  Diagnosis Date  . Allergy   . Hyperlipidemia   . Hypertension   . Glaucoma   . Osteopenia   . Arthritis   . Diverticulosis   . Status post dilation of esophageal narrowing   . Cataract   . GERD (gastroesophageal reflux disease)   . Thyroid disease   . Diabetes    Past Surgical History  Procedure Laterality Date  . Abdominal hysterectomy    . Tubal ligation  1976    bilateral  . Thyroidectomy    . Bunionectomy  1985    bilateral  . Trigger finger release  1999  . Carpal tunnel release  2008  . Retinal detachment surgery  2008  . Tonsillectomy    . Knee arthroscopy  2012    gioffre  . Tonsilectomy, adenoidectomy, bilateral myringotomy and tubes    . Hiatal hernia repair    . Rectal ultrasound N/A 03/20/2013    Procedure: RECTAL ULTRASOUND;  Surgeon: Romie Levee, MD;  Location: [facility];  Service: Endoscopy;  Laterality: N/A;  . Anal rectal manometry N/A 03/20/2013    Procedure: ANAL RECTAL MANOMETRY;  Surgeon: Romie Levee, MD;  Location: [facility];  Service: Endoscopy;  Laterality: N/A;   Family History  Problem Relation Age of Onset  . Diabetes Mother   . Heart disease Mother     CABG  . Heart attack Mother   . Stroke Mother   . Cancer Father     liver  . Colon cancer Maternal Grandmother   . Pancreatic cancer Maternal Grandmother   . Stomach cancer Maternal Grandmother    History  Substance Use Topics  . Smoking status: Never Smoker   . Smokeless tobacco: Never Used  . Alcohol Use: No   OB History   Grav Para Term Preterm Abortions TAB  SAB Ect Mult Living                 Review of Systems + sore throat + cough No pleuritic pain No wheezing + nasal congestion + post-nasal drainage No sinus pain/pressure No itchy/red eyes No earache No hemoptysis No SOB + fever, + chills No nausea No vomiting + abdominal pain + diarrhea, resolved No urinary symptoms No skin rash + fatigue + myalgias + headache Used OTC meds without relief  Allergies  Lisinopril and Metformin and related  Home Medications   Current Outpatient Rx  Name  Route  Sig  Dispense  Refill  . aspirin 81 MG tablet   Oral   Take 81 mg by mouth daily.           . benzonatate (TESSALON) 200 MG capsule   Oral   Take 1 capsule (200 mg total) by mouth at bedtime. Take as needed for cough   12 capsule   0   . brimonidine (ALPHAGAN P) 0.1 % SOLN   Both Eyes   Place 1 drop into both eyes 2 (two) times daily.           . Calcium Carbonate-Vit D-Min (GNP CALCIUM  1200) 1200-1000 MG-UNIT CHEW   Oral   Chew 1 tablet by mouth daily.           . cetirizine (ZYRTEC) 10 MG tablet   Oral   Take 10 mg by mouth daily.           . Cholecalciferol (VITAMIN D) 1000 UNITS capsule   Oral   Take 1,000 Units by mouth daily.           . diclofenac sodium (VOLTAREN) 1 % GEL      Rub on small amount to L thumb joint 2-3 times daily   100 g   1   . glimepiride (AMARYL) 1 MG tablet   Oral   Take 1 tablet (1 mg total) by mouth daily before breakfast.   180 tablet   3   . latanoprost (XALATAN) 0.005 % ophthalmic solution      1 drop at bedtime.         . Multiple Vitamins-Iron (MULTIVITAMIN/IRON) TABS   Oral   Take 1 tablet by mouth daily.           Marland Kitchen oseltamivir (TAMIFLU) 75 MG capsule   Oral   Take 1 capsule (75 mg total) by mouth every 12 (twelve) hours.   10 capsule   0   . Probiotic Product (ALIGN) 4 MG CAPS   Oral   Take 1 capsule by mouth daily.           . sucralfate (CARAFATE) 1 G tablet   Oral   Take 1 g by  mouth 4 (four) times daily -  with meals and at bedtime.         . valsartan-hydrochlorothiazide (DIOVAN-HCT) 80-12.5 MG per tablet      TAKE 1 TABLET BY MOUTH DAILY.   30 tablet   0    BP 128/72  Pulse 111  Temp(Src) 100 F (37.8 C) (Oral)  Ht 5\' 2"  (1.575 m)  Wt 127 lb (57.607 kg)  BMI 23.22 kg/m2  SpO2 99% Physical Exam Nursing notes and Vital Signs reviewed. Appearance:  Patient appears healthy, stated age, and in no acute distress Eyes:  Pupils are equal, round, and reactive to light and accomodation.  Extraocular movement is intact.  Conjunctivae are not inflamed  Ears:  Canals normal.  Tympanic membranes normal.  Nose:  Mildly congested turbinates.  No sinus tenderness.   Pharynx:  Normal Neck:  Supple.  Slightly tender shotty posterior nodes are palpated bilaterally  Lungs:  Clear to auscultation.  Breath sounds are equal.  Heart:  Regular rate and rhythm without murmurs, rubs, or gallops.  Abdomen:  Nontender without masses or hepatosplenomegaly.  Bowel sounds are present.  No CVA or flank tenderness.  Extremities:  No edema.  No calf tenderness Skin:  No rash present.   ED Course  Procedures  none    MDM   1. Influenza-like illness    Begin Tamiflu.  Prescription written for Benzonatate [facility]) to take at bedtime for night-time cough.  Take plain Mucinex (1200 mg guaifenesin) twice daily for cough and congestion.  Increase fluid intake, rest.  Recommend using saline nasal spray several times daily and saline nasal irrigation (AYR is a common brand) Try warm salt water gargles for sore throat.  Stop all antihistamines for now, and other non-prescription cough/cold preparations. May take Ibuprofen 200mg , 4 tabs every 8 hours with food for fever, body aches, headache, etc. Follow-up with family doctor if not improving about one week.  Lattie Haw, MD 04/06/13 [id number]

## 2013-04-25 ENCOUNTER — Ambulatory Visit (INDEPENDENT_AMBULATORY_CARE_PROVIDER_SITE_OTHER): Payer: Medicare Other | Admitting: General Surgery

## 2013-04-26 ENCOUNTER — Ambulatory Visit (INDEPENDENT_AMBULATORY_CARE_PROVIDER_SITE_OTHER): Payer: Medicare Other | Admitting: General Surgery

## 2013-04-26 ENCOUNTER — Encounter (INDEPENDENT_AMBULATORY_CARE_PROVIDER_SITE_OTHER): Payer: Self-pay | Admitting: General Surgery

## 2013-04-26 VITALS — BP 122/68 | HR 84 | Temp 97.0°F | Ht 63.0 in | Wt 128.0 lb

## 2013-04-26 DIAGNOSIS — R159 Full incontinence of feces: Secondary | ICD-10-CM

## 2013-04-26 NOTE — Patient Instructions (Signed)
Try taking scheduled imodium 1-2 times a day.  Call the office if you would like to discuss the nerve stimulator more.

## 2013-04-26 NOTE — Progress Notes (Signed)
[name] is a 68 y.o. female who is here for a follow up visit regarding her fecal incontinence.  Her anal ultrasound showed no signs of muscle defect. Her anal manometry showed an intact internal sphincter with good function but no external sphincter function. She has stopped her fiber supplementations and is using Imodium to control her diarrhea. This seems to be working better for her. She still has no sensation to defecate.  Objective: Filed Vitals:   04/26/13 1153  BP: 122/68  Pulse: 84  Temp: 97 F (36.1 C)    General appearance: alert and cooperative GI: normal findings: soft, non-tender   Assessment and Plan: We discussed continuing with scheduled Imodium one to 2 times a day versus sacral nerve stimulation. She will try the Imodium and call the office if she would like to discuss sacral nerve stimulation further.    Rosario Adie, [facility], [place]

## 2013-05-07 ENCOUNTER — Other Ambulatory Visit: Payer: Self-pay | Admitting: Internal Medicine

## 2013-06-09 ENCOUNTER — Ambulatory Visit: Payer: Medicare Other | Admitting: *Deleted

## 2013-06-12 ENCOUNTER — Other Ambulatory Visit: Payer: Self-pay | Admitting: Internal Medicine

## 2013-06-12 DIAGNOSIS — Z1231 Encounter for screening mammogram for malignant neoplasm of breast: Secondary | ICD-10-CM

## 2013-06-13 ENCOUNTER — Ambulatory Visit (HOSPITAL_COMMUNITY)
Admission: RE | Admit: 2013-06-13 | Discharge: 2013-06-13 | Disposition: A | Discharge status: Home / Self Care | Payer: Medicare Other | Source: Ambulatory Visit | Attending: Internal Medicine | Admitting: Internal Medicine

## 2013-06-13 DIAGNOSIS — Z1231 Encounter for screening mammogram for malignant neoplasm of breast: Secondary | ICD-10-CM | POA: Insufficient documentation

## 2013-06-21 ENCOUNTER — Other Ambulatory Visit: Payer: Self-pay | Admitting: Internal Medicine

## 2013-07-10 ENCOUNTER — Encounter: Payer: Medicare Other | Attending: Internal Medicine

## 2013-07-10 DIAGNOSIS — E119 Type 2 diabetes mellitus without complications: Secondary | ICD-10-CM

## 2013-07-10 DIAGNOSIS — Z713 Dietary counseling and surveillance: Secondary | ICD-10-CM | POA: Diagnosis not present

## 2013-07-10 NOTE — Patient Instructions (Signed)
Goals:  Follow Diabetes Meal Plan as instructed  Eat 3 meals and 2 snacks, every 3-5 hrs  Limit carbohydrate intake to 45 grams carbohydrate/meal Limit carbohydrate intake to 15 grams carbohydrate/snack Add lean protein foods to meals/snacks  Monitor glucose levels as instructed by your doctor  Aim for goal of 15-30 mins of physical activity daily as tolerated  Bring food record and glucose log to your next nutrition visit 

## 2013-07-10 NOTE — Progress Notes (Signed)
Patient was seen on 07/10/2013 for a review of the series of three diabetes self-management courses at the [facility]. The following learning objectives were met by the patient during this class:    Reviewed blood glucose monitoring and interpretation including the recommended target ranges and Hgb A1c.    Reviewed on carb counting, importance of regularly scheduled meals/snacks, and meal planning.    Reviewed the effects of physical activity on glucose levels and long-term glucose control.  Recommended goal of 150 minutes of physical activity/week.   Reviewed patient medications and discussed role of medication on blood glucose and possible side effects.   Discussed strategies to manage stress, psychosocial issues, and other obstacles to diabetes management.   Encouraged moderate weight reduction to improve glucose levels.     Reviewed short-term complications: hyper- and hypo-glycemia.  Discussed causes, symptoms, and treatment options.   Reviewed prevention, detection, and treatment of long-term complications.  Discussed the role of prolonged elevated glucose levels on body systems.  Goals:  Follow Diabetes Meal Plan as instructed  Eat 3 meals and 2 snacks, every 3-5 hrs  Limit carbohydrate intake to 45 grams carbohydrate/meal Limit carbohydrate intake to 15 grams carbohydrate/snack Add lean protein foods to meals/snacks  Monitor glucose levels as instructed by your doctor  Aim for goal of 15-30 mins of physical activity daily as tolerated  Bring food record and glucose log to your next nutrition visit   

## 2013-07-12 DIAGNOSIS — H26499 Other secondary cataract, unspecified eye: Secondary | ICD-10-CM | POA: Diagnosis not present

## 2013-07-12 DIAGNOSIS — H43399 Other vitreous opacities, unspecified eye: Secondary | ICD-10-CM | POA: Diagnosis not present

## 2013-07-12 DIAGNOSIS — H4011X0 Primary open-angle glaucoma, stage unspecified: Secondary | ICD-10-CM | POA: Diagnosis not present

## 2013-09-11 ENCOUNTER — Other Ambulatory Visit: Payer: Self-pay | Admitting: Internal Medicine

## 2013-11-24 ENCOUNTER — Other Ambulatory Visit: Payer: Self-pay | Admitting: Internal Medicine

## 2013-12-21 DIAGNOSIS — H401131 Primary open-angle glaucoma, bilateral, mild stage: Secondary | ICD-10-CM | POA: Insufficient documentation

## 2013-12-21 DIAGNOSIS — Z961 Presence of intraocular lens: Secondary | ICD-10-CM | POA: Diagnosis not present

## 2013-12-21 DIAGNOSIS — E119 Type 2 diabetes mellitus without complications: Secondary | ICD-10-CM | POA: Insufficient documentation

## 2013-12-21 DIAGNOSIS — H4011X0 Primary open-angle glaucoma, stage unspecified: Secondary | ICD-10-CM | POA: Diagnosis not present

## 2013-12-21 DIAGNOSIS — H409 Unspecified glaucoma: Secondary | ICD-10-CM | POA: Diagnosis not present

## 2013-12-29 ENCOUNTER — Telehealth: Payer: Self-pay | Admitting: Internal Medicine

## 2013-12-29 MED ORDER — ATORVASTATIN CALCIUM 10 MG PO TABS: 10.0000 mg | ORAL_TABLET | Freq: Every day | ORAL | Status: DC

## 2013-12-29 NOTE — Telephone Encounter (Signed)
Medication sent in. 

## 2013-12-29 NOTE — Telephone Encounter (Signed)
[id number] - [place], [place] - [address] is requesting re-fill on atorvastatin (LIPITOR) 10 MG tablet

## 2013-12-30 DIAGNOSIS — J309 Allergic rhinitis, unspecified: Secondary | ICD-10-CM | POA: Diagnosis not present

## 2014-01-01 DIAGNOSIS — M25519 Pain in unspecified shoulder: Secondary | ICD-10-CM | POA: Diagnosis not present

## 2014-01-18 DIAGNOSIS — Z23 Encounter for immunization: Secondary | ICD-10-CM | POA: Diagnosis not present

## 2014-01-19 IMAGING — CR DG HAND 2V*L*
2 series · 2 of 2 positions shown · non-contrast
Comparison: None.

CLINICAL DATA: Pain without injury

EXAM:
LEFT HAND - 2 VIEW

[view not recorded (1 of 2)]
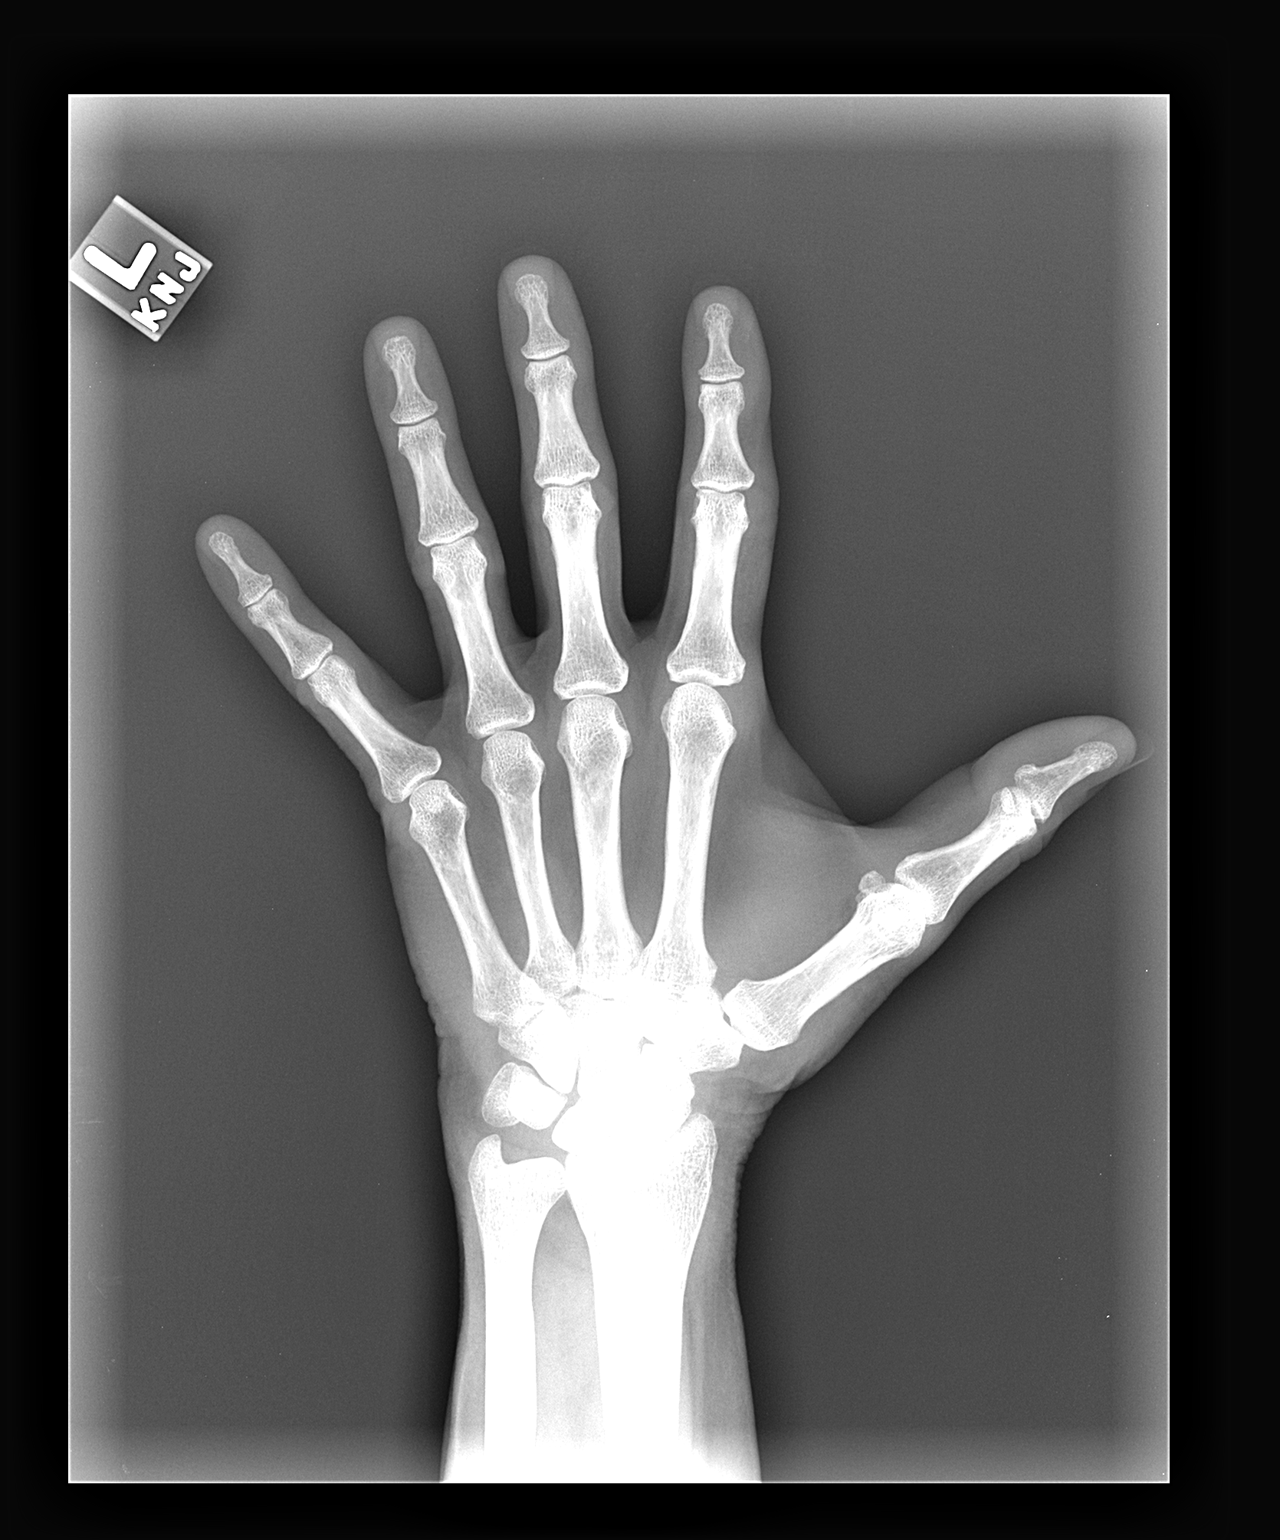

[view not recorded (2 of 2)]
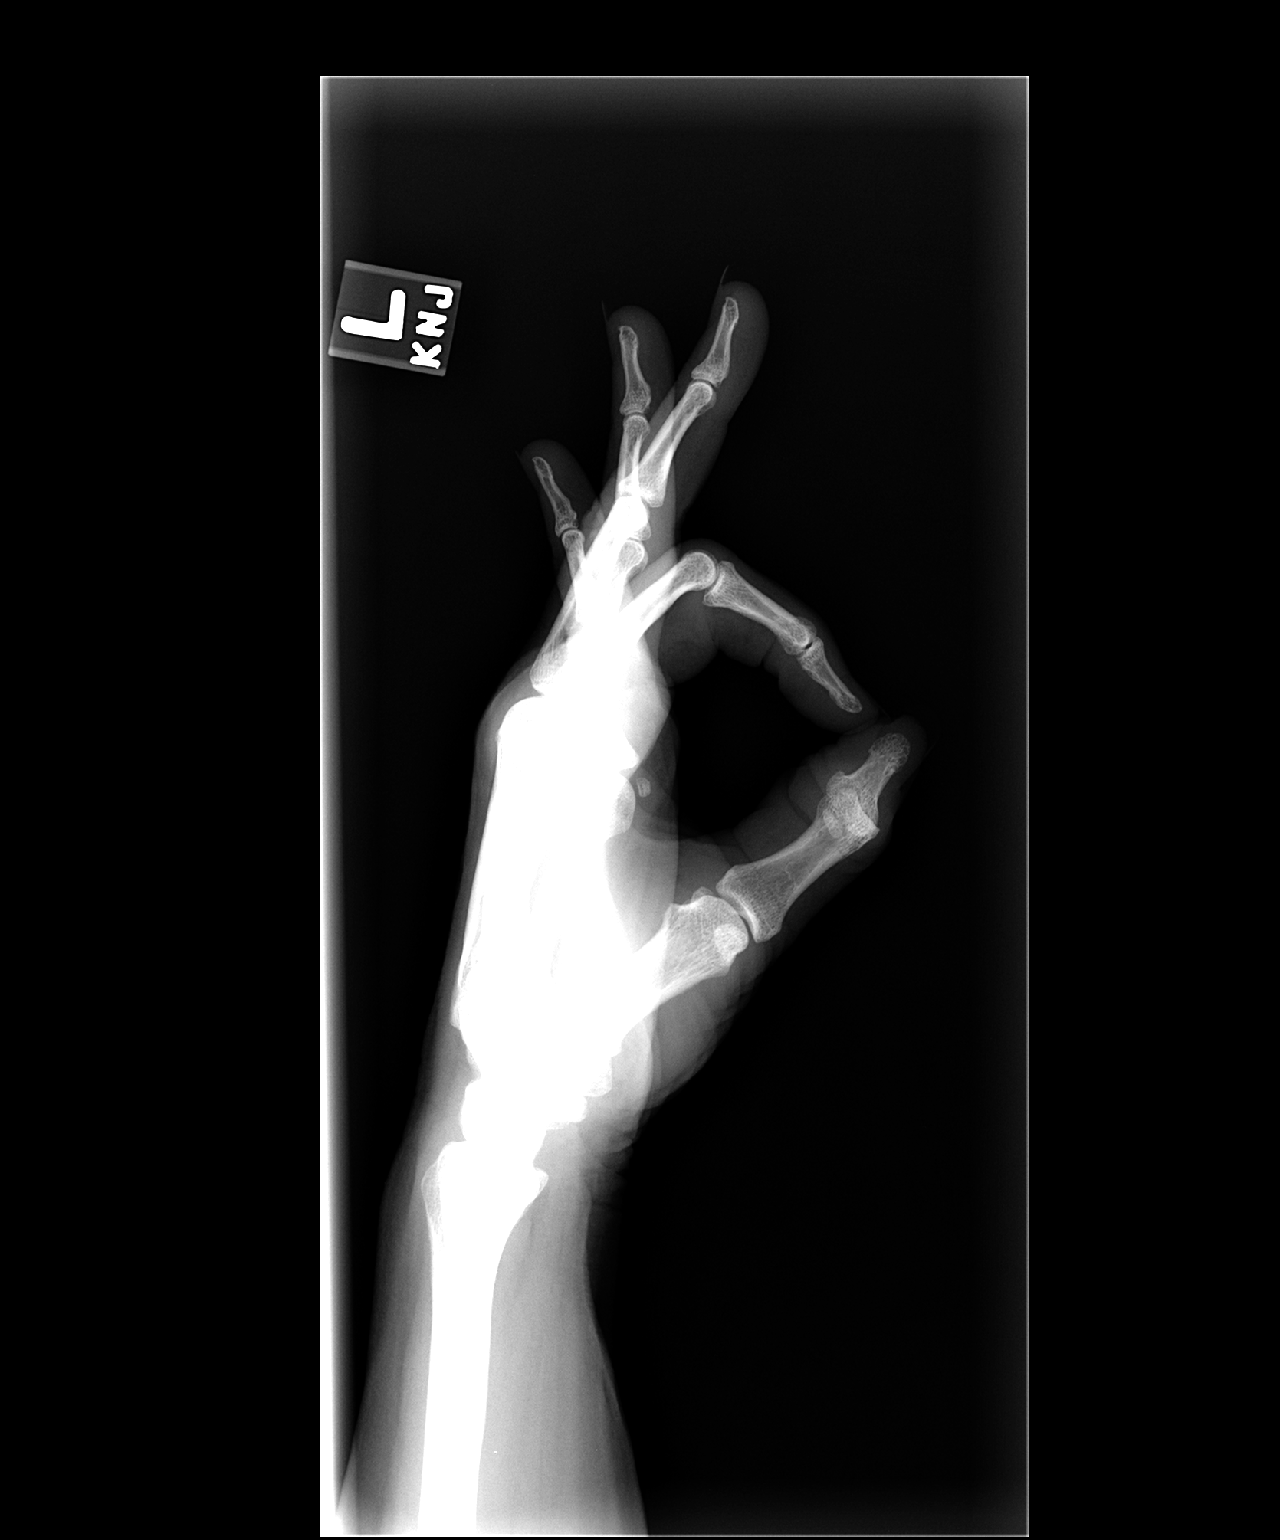

[2 of 2 positions shown; findings below may reference images not displayed]

FINDINGS: There is no evidence of fracture or dislocation. Subchondral cyst in
the lunate. Carpal rows remain intact. Soft tissues are
unremarkable.
IMPRESSION: Degenerative cyst or geode in the lunate.  Otherwise negative.

## 2014-02-09 ENCOUNTER — Other Ambulatory Visit: Payer: Self-pay | Admitting: Internal Medicine

## 2014-02-10 ENCOUNTER — Other Ambulatory Visit: Payer: Self-pay | Admitting: Internal Medicine

## 2014-02-12 ENCOUNTER — Other Ambulatory Visit: Payer: Self-pay | Admitting: Internal Medicine

## 2014-02-17 ENCOUNTER — Other Ambulatory Visit: Payer: Self-pay | Admitting: Internal Medicine

## 2014-03-23 ENCOUNTER — Other Ambulatory Visit: Payer: Self-pay | Admitting: Family Medicine

## 2014-03-23 ENCOUNTER — Ambulatory Visit (INDEPENDENT_AMBULATORY_CARE_PROVIDER_SITE_OTHER): Payer: Medicare Other | Admitting: Family Medicine

## 2014-03-23 ENCOUNTER — Encounter: Payer: Self-pay | Admitting: Family Medicine

## 2014-03-23 DIAGNOSIS — E890 Postprocedural hypothyroidism: Secondary | ICD-10-CM | POA: Insufficient documentation

## 2014-03-23 DIAGNOSIS — I10 Essential (primary) hypertension: Secondary | ICD-10-CM

## 2014-03-23 DIAGNOSIS — E1165 Type 2 diabetes mellitus with hyperglycemia: Secondary | ICD-10-CM | POA: Diagnosis not present

## 2014-03-23 DIAGNOSIS — H409 Unspecified glaucoma: Secondary | ICD-10-CM | POA: Insufficient documentation

## 2014-03-23 DIAGNOSIS — Z9009 Acquired absence of other part of head and neck: Secondary | ICD-10-CM | POA: Insufficient documentation

## 2014-03-23 DIAGNOSIS — E039 Hypothyroidism, unspecified: Secondary | ICD-10-CM | POA: Diagnosis not present

## 2014-03-23 DIAGNOSIS — IMO0002 Reserved for concepts with insufficient information to code with codable children: Secondary | ICD-10-CM

## 2014-03-23 DIAGNOSIS — K222 Esophageal obstruction: Secondary | ICD-10-CM | POA: Diagnosis not present

## 2014-03-23 DIAGNOSIS — R159 Full incontinence of feces: Secondary | ICD-10-CM | POA: Insufficient documentation

## 2014-03-23 DIAGNOSIS — E785 Hyperlipidemia, unspecified: Secondary | ICD-10-CM | POA: Diagnosis not present

## 2014-03-23 LAB — COMPREHENSIVE METABOLIC PANEL
ALK PHOS: 53 U/L (ref 39–117)
ALT: 22 U/L (ref 0–35)
AST: 26 U/L (ref 0–37)
Albumin: 4.4 g/dL (ref 3.5–5.2)
BILIRUBIN TOTAL: 0.7 mg/dL (ref 0.2–1.2)
BUN: 14 mg/dL (ref 6–23)
CO2: 29 meq/L (ref 19–32)
Calcium: 9.7 mg/dL (ref 8.4–10.5)
Chloride: 101 mEq/L (ref 96–112)
Creatinine, Ser: 0.9 mg/dL (ref 0.4–1.2)
GFR: 68.55 mL/min (ref >60.00)
Glucose, Bld: 132 mg/dL — ABNORMAL HIGH (ref 70–99)
Potassium: 5 mEq/L (ref 3.5–5.1)
SODIUM: 139 meq/L (ref 135–145)
TOTAL PROTEIN: 7.4 g/dL (ref 6.0–8.3)

## 2014-03-23 LAB — HEMOGLOBIN A1C: HEMOGLOBIN A1C: 7 % — AB (ref 4.6–6.5)

## 2014-03-23 LAB — LDL CHOLESTEROL, DIRECT: LDL DIRECT: 44.4 mg/dL

## 2014-03-23 MED ORDER — ONETOUCH LANCETS MISC: Status: DC

## 2014-03-23 MED ORDER — GLUCOSE BLOOD VI STRP: ORAL_STRIP | Status: DC

## 2014-03-23 MED ORDER — VALSARTAN-HYDROCHLOROTHIAZIDE 80-12.5 MG PO TABS: 0.5000 | ORAL_TABLET | Freq: Every day | ORAL | Status: DC

## 2014-03-23 NOTE — Assessment & Plan Note (Addendum)
Poor control with A1c of 9.2 on last visit. She was started on Amaryl 1 mg but has not had A1c rechecked since August of last year. She did not tolerate metformin. We will repeat an A1c today and make adjustments accordingly. We also instructed patient on the use of a glucometer and sent in testing supplies. Focused on teaching her about hypoglycemia and management especially since we may increase Amaryl.

## 2014-03-23 NOTE — Progress Notes (Signed)
Meghan Reddish, MD Phone: [phone]  Subjective:  Patient presents today to establish care with me as their new primary care provider. Patient was formerly a patient of Dr. Leanne Chang. Chief complaint-noted.   DIABETES Type II-poor control  Lab Results  Component Value Date   HGBA1C 9.2* 12/01/2012   HGBA1C 9.2* 11/23/2012   HGBA1C 6.3 07/13/2010  Went through diabetes education and started eating differently- cutting some sweets down Medications taking and tolerating-glimepiride 1mg , not taking metformin due to diarrhea Blood Sugars per patient-never given meter Diet-improved as above Regular Exercise-no, advised On Aspirin-yes On statin-atorvastatin 10mg  Daily foot monitoring-yes  ROS- Denies Polyuria,Polydipsia, nocturia, Vision changes, feet pain/tingling. Endorses shakiness once a month if forgets to eat. Mild to moderate right shoulder pain with sleeping.   Hypertension-well-controlled BP Readings from Last 3 Encounters:  03/23/14 120/62  04/26/13 122/68  04/03/13 128/72  Home BP monitoring-no Compliant with medications-yes without side effects ROS-Denies any CP, HA, SOB, blurry vision.   Hyperlipidemia-mild poor control for being started on statin  Lab Results  Component Value Date   LDLCALC 104* 12/01/2012  On statin-Atorvastatin 10 mg  Regular exercise-no ROS- no chest pain or shortness of breath. No myalgias  The following were reviewed and entered/updated in epic: Past Medical History  Diagnosis Date  . Allergy   . Hyperlipidemia   . Hypertension   . Glaucoma   . Osteopenia   . Arthritis   . Diverticulosis   . Status post dilation of esophageal narrowing   . Cataract   . GERD (gastroesophageal reflux disease)   . Thyroid disease   . Diabetes    Patient Active Problem List   Diagnosis Date Noted  . Fecal incontinence 03/23/2014    Priority: High  . Diabetes mellitus type II, uncontrolled 10/19/2006    Priority: High  . H/O partial thyroidectomy  03/23/2014    Priority: Medium  . Hyperlipidemia 10/19/2006    Priority: Medium  . Essential hypertension 10/19/2006    Priority: Medium  . Glaucoma 03/23/2014    Priority: Low  . Family history of malignant neoplasm of gastrointestinal tract 08/26/2010    Priority: Low  . Anemia 08/26/2010    Priority: Low  . Esophageal stricture 07/16/2010    Priority: Low  . Osteopenia 04/22/2007    Priority: Low   Past Surgical History  Procedure Laterality Date  . Abdominal hysterectomy    . Tubal ligation  1976    bilateral  . Thyroidectomy  1979    partial?  . Bunionectomy  1985    bilateral  . Trigger finger release  1999  . Carpal tunnel release  2008  . Retinal detachment surgery  2008  . Tonsillectomy    . Knee arthroscopy  2012    gioffre  . Hiatal hernia repair    . Rectal ultrasound N/A 03/20/2013    nerve damage  . Anal rectal manometry N/A 03/20/2013    Family History  Problem Relation Age of Onset  . Diabetes Mother   . Heart disease Mother     CABG 79 in 43  . Heart attack Mother   . Stroke Mother   . Cancer Father 15    liver  . Colon cancer Maternal Grandmother   . Pancreatic cancer Maternal Grandmother   . Stomach cancer Maternal Grandmother     Medications- reviewed and updated Current Outpatient Prescriptions  Medication Sig Dispense Refill  . aspirin 81 MG tablet Take 81 mg by mouth daily.      Marland Kitchen  atorvastatin (LIPITOR) 10 MG tablet TAKE 1 TABLET (10 MG TOTAL) BY MOUTH DAILY. 90 tablet 0  . brimonidine (ALPHAGAN P) 0.1 % SOLN Place 1 drop into both eyes 2 (two) times daily.      . Calcium Carbonate-Vit D-Min (GNP CALCIUM 1200) 1200-1000 MG-UNIT CHEW Chew 1 tablet by mouth daily.      . cetirizine (ZYRTEC) 10 MG tablet Take 10 mg by mouth daily.      . Cholecalciferol (VITAMIN D) 1000 UNITS capsule Take 1,000 Units by mouth daily.      Marland Kitchen glimepiride (AMARYL) 1 MG tablet TAKE 1 TABLET (1 MG TOTAL) BY MOUTH DAILY BEFORE BREAKFAST. 180 tablet 0  .  latanoprost (XALATAN) 0.005 % ophthalmic solution 1 drop at bedtime.    . Multiple Vitamins-Iron (MULTIVITAMIN/IRON) TABS Take 1 tablet by mouth daily.      . Probiotic Product (ALIGN) 4 MG CAPS Take 1 capsule by mouth daily.      . valsartan-hydrochlorothiazide (DIOVAN-HCT) 80-12.5 MG per tablet Take 0.5 tablets by mouth daily. 30 tablet 5  . glucose blood (ONETOUCH VERIO) test strip Check blood sugars daily Dx: E11.9 100 each 11  . ONE TOUCH LANCETS MISC Check blood sugars daily Dx: E11.9 100 each 11   No current facility-administered medications for this visit.    Allergies-reviewed and updated Allergies  Allergen Reactions  . Lisinopril     REACTION: hallucinations/diarrhea  . Metformin And Related Diarrhea    History   Social History  . Marital Status: Married    Spouse Name: N/A    Number of Children: 2  . Years of Education: N/A   Occupational History  . retired    Social History Main Topics  . Smoking status: Never Smoker   . Smokeless tobacco: Never Used  . Alcohol Use: No  . Drug Use: No  . Sexual Activity: None   Other Topics Concern  . None   Social History Narrative   Married 1967. 2 children (daughters). 7 grandkids.       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 Oropharynx normal CV: RRR no murmurs rubs or gallops Lungs: CTAB no crackles, wheeze, rhonchi Abdomen: soft/nontender/nondistended/normal bowel sounds.  Ext: no edema Skin: warm, dry, no rash Neuro: grossly normal, moves all extremities, PERRLA  DM foot exam   Assessment/Plan:  Diabetes mellitus type II, uncontrolled Poor control with A1c of 9.2 on last visit. She was started on Amaryl 1 mg but has not had A1c rechecked since August of last year. She did not  tolerate metformin. We will repeat an A1c today and make adjustments accordingly. We also instructed patient on the use of a glucometer and sent in testing supplies. Focused on teaching her about hypoglycemia and management especially since we may increase Amaryl.   Essential hypertension Well-controlled on Valsartan-hctz 80-12.5 (1/2 tab). Continue.  Hyperlipidemia Last LDL was 104 before being started on atorvastatin. We will repeat a direct LDL at this time to see if she is below goal 100   Plan follow up 3 months. Nonfasting labs Orders Placed This Encounter  Procedures  . Hemoglobin A1c    [place]  . LDL cholesterol, direct    [place]  . Comprehensive metabolic panel  [place]  . TSH    [place]    Meds ordered this encounter  Medications  . valsartan-hydrochlorothiazide (DIOVAN-HCT) 80-12.5 MG per tablet    Sig: Take 0.5 tablets by mouth daily.    Dispense:  30 tablet    Refill:  5  . glucose blood (ONETOUCH VERIO) test strip    Sig: Check blood sugars daily Dx: E11.9    Dispense:  100 each    Refill:  11  . ONE TOUCH LANCETS MISC    Sig: Check blood sugars daily Dx: E11.9    Dispense:  100 each    Refill:  11

## 2014-03-23 NOTE — Assessment & Plan Note (Signed)
Well-controlled on Valsartan-hctz 80-12.5 (1/2 tab). Continue.

## 2014-03-23 NOTE — Patient Instructions (Addendum)
[name] and strips ordered. Show her how to check.   Health Maintenance Due  Topic Date Due  . OPHTHALMOLOGY EXAM -please have them send [place] records next time you seen them 11/30/1955  . HEMOGLOBIN A1C - today 06/03/2013   See me in 3 months and we can recheck a1c.   If your a1c is above 7, I am going to increase your amaryl/glimepiride.   If your right shoulder continues to bother you after icing it and continuing range of motion-come see me to discuss further  Check cholesterol today  Hypoglycemia Hypoglycemia occurs when the glucose in your blood is too low. Glucose is a type of sugar that is your body's main energy source. Hormones, such as insulin and glucagon, control the level of glucose in the blood. Insulin lowers blood glucose and glucagon increases blood glucose. Having too much insulin in your blood stream, or not eating enough food containing sugar, can result in hypoglycemia. Hypoglycemia can happen to people with or without diabetes. It can develop quickly and can be a medical emergency.  CAUSES   Missing or delaying meals.  Not eating enough carbohydrates at meals.  Taking too much diabetes medicine.  Not timing your oral diabetes medicine or insulin doses with meals, snacks, and exercise.  Nausea and vomiting.  Certain medicines.  Severe illnesses, such as hepatitis, kidney disorders, and certain eating disorders.  Increased activity or exercise without eating something extra or adjusting medicines.  Drinking too much alcohol.  A nerve disorder that affects body functions like your heart rate, blood pressure, and digestion (autonomic neuropathy).  A condition where the stomach muscles do not function properly (gastroparesis). Therefore, medicines and food may not absorb properly.  Rarely, a tumor of the pancreas can produce too much insulin. SYMPTOMS   Hunger.  Sweating (diaphoresis).  Change in body  temperature.  Shakiness.  Headache.  Anxiety.  Lightheadedness.  Irritability.  Difficulty concentrating.  Dry mouth.  Tingling or numbness in the hands or feet.  Restless sleep or sleep disturbances.  Altered speech and coordination.  Change in mental status.  Seizures or prolonged convulsions.  Combativeness.  Drowsiness (lethargic).  Weakness.  Increased heart rate or palpitations.  Confusion.  Pale, gray skin color.  Blurred or double vision.  Fainting. DIAGNOSIS  A physical exam and medical history will be performed. Your caregiver may make a diagnosis based on your symptoms. Blood tests and other lab tests may be performed to confirm a diagnosis. Once the diagnosis is made, your caregiver will see if your signs and symptoms go away once your blood glucose is raised.  TREATMENT  Usually, you can easily treat your hypoglycemia when you notice symptoms.  Check your blood glucose. If it is less than 70 mg/dl, take one of the following:   3-4 glucose tablets.    cup juice.    cup regular soda.   1 cup skim milk.   -1 tube of glucose gel.   5-6 hard candies.   Avoid high-fat drinks or food that may delay a rise in blood glucose levels.  Do not take more than the recommended amount of sugary foods, drinks, gel, or tablets. Doing so will cause your blood glucose to go too high.   Wait 10-15 minutes and recheck your blood glucose. If it is still less than 70 mg/dl or below your target range, repeat treatment.   Eat a snack if it is more than 1 hour until your next meal.  There may be  a time when your blood glucose may go so low that you are unable to treat yourself at home when you start to notice symptoms. You may need someone to help you. You may even faint or be unable to swallow. If you cannot treat yourself, someone will need to bring you to the hospital.  [place]  If you have diabetes, follow your diabetes management  plan by:  Taking your medicines as directed.  Following your exercise plan.  Following your meal plan. Do not skip meals. Eat on time.  Testing your blood glucose regularly. Check your blood glucose before and after exercise. If you exercise longer or different than usual, be sure to check blood glucose more frequently.  Wearing your medical alert jewelry that says you have diabetes.  Identify the cause of your hypoglycemia. Then, develop ways to prevent the recurrence of hypoglycemia.  Do not take a hot bath or shower right after an insulin shot.  Always carry treatment with you. Glucose tablets are the easiest to carry.  If you are going to drink alcohol, drink it only with meals.  Tell friends or family members ways to keep you safe during a seizure. This may include removing hard or sharp objects from the area or turning you on your side.  Maintain a healthy weight. SEEK MEDICAL CARE IF:   You are having problems keeping your blood glucose in your target range.  You are having frequent episodes of hypoglycemia.  You feel you might be having side effects from your medicines.  You are not sure why your blood glucose is dropping so low.  You notice a change in vision or a new problem with your vision. SEEK IMMEDIATE MEDICAL CARE IF:   Confusion develops.  A change in mental status occurs.  The inability to swallow develops.  Fainting occurs. Document Released: 03/23/2005 Document Revised: 03/28/2013 Document Reviewed: 07/20/2011 [facility] Patient Information 2015 [place], [place]. This information is not intended to replace advice given to you by your health care provider. Make sure you discuss any questions you have with your health care provider.

## 2014-03-23 NOTE — Assessment & Plan Note (Signed)
Last LDL was 104 before being started on atorvastatin. We will repeat a direct LDL at this time to see if she is below goal 100

## 2014-03-27 LAB — TSH: TSH: 1.795 u[IU]/mL (ref 0.350–4.500)

## 2014-04-08 ENCOUNTER — Other Ambulatory Visit: Payer: Self-pay | Admitting: Internal Medicine

## 2014-04-09 ENCOUNTER — Ambulatory Visit: Payer: Medicare Other | Admitting: Family Medicine

## 2014-05-02 DIAGNOSIS — H4011X1 Primary open-angle glaucoma, mild stage: Secondary | ICD-10-CM | POA: Diagnosis not present

## 2014-05-02 DIAGNOSIS — Z961 Presence of intraocular lens: Secondary | ICD-10-CM | POA: Diagnosis not present

## 2014-05-16 ENCOUNTER — Other Ambulatory Visit: Payer: Self-pay | Admitting: Family Medicine

## 2014-05-16 DIAGNOSIS — Z1231 Encounter for screening mammogram for malignant neoplasm of breast: Secondary | ICD-10-CM

## 2014-06-18 ENCOUNTER — Encounter: Payer: Self-pay | Admitting: Family Medicine

## 2014-06-18 ENCOUNTER — Ambulatory Visit (INDEPENDENT_AMBULATORY_CARE_PROVIDER_SITE_OTHER): Payer: Medicare Other | Admitting: Family Medicine

## 2014-06-18 DIAGNOSIS — E785 Hyperlipidemia, unspecified: Secondary | ICD-10-CM

## 2014-06-18 DIAGNOSIS — IMO0002 Reserved for concepts with insufficient information to code with codable children: Secondary | ICD-10-CM

## 2014-06-18 DIAGNOSIS — E1165 Type 2 diabetes mellitus with hyperglycemia: Secondary | ICD-10-CM

## 2014-06-18 DIAGNOSIS — I10 Essential (primary) hypertension: Secondary | ICD-10-CM | POA: Diagnosis not present

## 2014-06-18 LAB — HEMOGLOBIN A1C: HEMOGLOBIN A1C: 6.9 % — AB (ref 4.6–6.5)

## 2014-06-18 NOTE — Progress Notes (Signed)
[name], MD Phone: [phone]  Subjective:   [name] is a 69 y.o. year old very pleasant female patient who presents with the following:  DIABETES Type II-controlled last visit on amaryl 1mg   Lab Results  Component Value Date   HGBA1C 7.0* 03/23/2014   HGBA1C 9.2* 12/01/2012   HGBA1C 9.2* 11/23/2012  Medications taking and tolerating-yes Blood Sugars per patient-fasting-86-135 vast majority of the time, a few readings >135, 30 day average 112 Diet-doing well Regular Exercise-no planned regular exercise  On Aspirin-yes On statin-yes Daily foot monitoring-yes Health Maintenance Due  Topic Date Due  . OPHTHALMOLOGY EXAM  11/30/1955   ROS- Denies Polyuria,Polydipsia, nocturia, Vision changes, feet or hand numbness/pain/tingling. Denies Hypoglycemia symptoms (shaky, sweaty, hungry, weak anxious, tremor, palpitations, confusion, behavior change).   Hypertension-controlled  BP Readings from Last 3 Encounters:  06/18/14 118/76  03/23/14 120/62  04/26/13 122/68   Home BP monitoring-no Compliant with medications-yes without side effects ROS-Denies any CP, HA, SOB, blurry vision, LE edema  Hyperlipidemia-controlled Direct LDL 44 on 03/23/14  On statin: yes Regular exercise: no, advised Diet: admits to some poor choices ROS- no chest pain or shortness of breath. No myalgias  Past Medical History- Patient Active Problem List   Diagnosis Date Noted  . Fecal incontinence 03/23/2014    Priority: High  . Diabetes mellitus type II, uncontrolled 10/19/2006    Priority: High  . H/O partial thyroidectomy 03/23/2014    Priority: Medium  . Hyperlipidemia 10/19/2006    Priority: Medium  . Essential hypertension 10/19/2006    Priority: Medium  . Glaucoma 03/23/2014    Priority: Low  . Family history of malignant neoplasm of gastrointestinal tract 08/26/2010    Priority: Low  . Anemia 08/26/2010    Priority: Low  . Esophageal stricture 07/16/2010   Priority: Low  . Osteopenia 04/22/2007    Priority: Low   Medications- reviewed and updated Current Outpatient Prescriptions  Medication Sig Dispense Refill  . aspirin 81 MG tablet Take 81 mg by mouth daily.      Marland Kitchen atorvastatin (LIPITOR) 10 MG tablet TAKE 1 TABLET (10 MG TOTAL) BY MOUTH DAILY. 90 tablet 1  . Calcium Carbonate-Vit D-Min (GNP CALCIUM 1200) 1200-1000 MG-UNIT CHEW Chew 1 tablet by mouth daily.      . cetirizine (ZYRTEC) 10 MG tablet Take 10 mg by mouth daily.      . Cholecalciferol (VITAMIN D) 1000 UNITS capsule Take 1,000 Units by mouth daily.      Marland Kitchen glimepiride (AMARYL) 1 MG tablet TAKE 1 TABLET (1 MG TOTAL) BY MOUTH DAILY BEFORE BREAKFAST. 180 tablet 0  . glucose blood (ONETOUCH VERIO) test strip Check blood sugars daily Dx: E11.9 100 each 11  . Multiple Vitamins-Iron (MULTIVITAMIN/IRON) TABS Take 1 tablet by mouth daily.      . ONE TOUCH LANCETS MISC Check blood sugars daily Dx: E11.9 100 each 11  . Probiotic Product (ALIGN) 4 MG CAPS Take 1 capsule by mouth daily.      . valsartan-hydrochlorothiazide (DIOVAN-HCT) 80-12.5 MG per tablet Take 0.5 tablets by mouth daily. 30 tablet 5  . brimonidine (ALPHAGAN P) 0.1 % SOLN Place 1 drop into both eyes 2 (two) times daily.      Marland Kitchen latanoprost (XALATAN) 0.005 % ophthalmic solution 1 drop at bedtime.     No current facility-administered medications for this visit.     Objective: BP 118/76 mmHg  Pulse 76  Temp(Src) 97.6 F (36.4 C)  Wt 134 lb (60.782 kg) Gen: NAD,  resting comfortably in chair Mucous membranes are moist. CV: RRR no murmurs rubs or gallops Lungs: CTAB no crackles, wheeze, rhonchi Abdomen: soft/nontender/nondistended/normal bowel sounds. No rebound or guarding.  Ext: no edema Skin: warm, dry, no rash Neuro: grossly normal, moves all extremities  Assessment/Plan:  Diabetes mellitus type II, uncontrolled If a1c comes back 7 or less, 6 month follow up and change problem list to well controlled. Continue  amaryl 1mg  alone for now. Fasting CBGs averaging in 110-120 range, overall looks good   Hyperlipidemia Controlled on Atorvastatin 10mg  with Direct LDL 44 on 03/23/14   Essential hypertension Controlled on Valsartan-hctz 80-12.5 (1/2 tab)   6 month follow up if a1c 7 or less

## 2014-06-18 NOTE — Assessment & Plan Note (Signed)
If a1c comes back 7 or less, 6 month follow up and change problem list to well controlled. Continue amaryl 1mg  alone for now. Fasting CBGs averaging in 110-120 range, overall looks good

## 2014-06-18 NOTE — Assessment & Plan Note (Signed)
Controlled on Valsartan-hctz 80-12.5 (1/2 tab)

## 2014-06-18 NOTE — Assessment & Plan Note (Signed)
Controlled on Atorvastatin 10mg  with Direct LDL 44 on 03/23/14

## 2014-06-18 NOTE — Patient Instructions (Addendum)
Will receive last Pneumonia ([id number]) shot at next visit.  Have eye exam report faxed over once you have had that done [phone].  Update a1c today, if 7 or under, we will follow up in 6 months. I am hopeful  Bp looks great  Cholesterol doing well  Keep taking that vitamin D  Would love to see you get absolute minimum of 20 minutes walking in 3x a week. I think you can do it!

## 2014-06-18 NOTE — Addendum Note (Signed)
Addended by: Elmer Picker on: 06/18/2014 10:01 AM   Modules accepted: Orders

## 2014-06-19 ENCOUNTER — Ambulatory Visit: Payer: Medicare Other | Admitting: Family Medicine

## 2014-06-25 ENCOUNTER — Ambulatory Visit (HOSPITAL_COMMUNITY)
Admission: RE | Admit: 2014-06-25 | Discharge: 2014-06-25 | Disposition: A | Discharge status: Home / Self Care | Payer: Medicare Other | Source: Ambulatory Visit | Attending: Family Medicine | Admitting: Family Medicine

## 2014-06-25 DIAGNOSIS — Z1231 Encounter for screening mammogram for malignant neoplasm of breast: Secondary | ICD-10-CM | POA: Diagnosis present

## 2014-07-06 HISTORY — PX: OTHER SURGICAL HISTORY: SHX169

## 2014-07-13 ENCOUNTER — Other Ambulatory Visit: Payer: Self-pay | Admitting: General Surgery

## 2014-07-13 DIAGNOSIS — R159 Full incontinence of feces: Secondary | ICD-10-CM | POA: Diagnosis not present

## 2014-07-13 NOTE — H&P (Signed)
[name] 07/13/2014 10:31 AM Location: [facility] Patient #: [id number] DOB: 1945/07/04 Married / Language: [name] / Race: White Female History of Present Illness [name]; 07/08/3152 10:58 AM) Patient words: LU manometry.  The patient is a 69 year old female who presents with anal itching. This is a 69 year old female who I been treating over the last couple years for fecal incontinence. She underwent a renal ultrasound which showed no physical muscle defect. Her anal manometry shows an intact internal sphincter which functions well but no function of her external sphincter. She uses Imodium to control her loose bowel movements. She is unable to control her bowel movements when they are loose and has frequent leakage with this. She has severe urgency on a daily basis even with solid stool. She reports difficulty with control approximately every 3 days. Other Problems [name], [name]; 0/0/8676 19:50 AM) Hemorrhoids Oophorectomy Bilateral. Thyroid Disease  Past Surgical History [name], [name]; 12/07/2669 24:58 AM) Cataract Surgery Bilateral. Foot Surgery Bilateral. Hysterectomy (not due to cancer) - Complete MANOMETRY, ANORECTAL (09983) Outpatient.  Diagnostic Studies History [name], [name]; 06/12/2503 39:76 AM) Colonoscopy 1-5 years ago Mammogram within last year  Allergies [name], [name]; 10/07/4191 79:02 AM) Lisinopril *ANTIHYPERTENSIVES* MetFORMIN HCl *ANTIDIABETICS*  Medication History [name], [name]; 4/0/9735 32:99 AM) Aspirin EC (81MG  Tablet DR, Oral) Active. Lipitor (10MG  Tablet, Oral) Active. Alphagan P (0.1% Solution, Ophthalmic) Active. Calcium Carbonate (1250MG  Tablet Chewable, Oral) Active. ZyrTEC Allergy (10MG  Capsule, Oral) Active. Vitamin D (Cholecalciferol) (1000UNIT Tablet, Oral) Active. Amaryl (1MG  Tablet, Oral) Active. Xalatan (0.005% Solution, Ophthalmic) Active. Multivitamins w/Iron  (Oral) Active. Align (4MG  Capsule, Oral) Active. Diovan HCT (80-12.5MG  Tablet, Oral) Active. OneTouch Delica Lancets Active. OneTouch Verio IQ (In Vitro) Active. Medications Reconciled  Social History [name], [name]; 05/10/2681 41:96 AM) Caffeine use Carbonated beverages, Tea. No alcohol use No drug use Tobacco use Never smoker.  Family History [name], [name]; 05/08/2977 89:21 AM) Diabetes Mellitus Mother. Hypertension Mother. Malignant Neoplasm Of Pancreas Father.  Pregnancy / Birth History [name], [name]; 04/14/4172 08:14 AM) [name] Age 2 Maternal age 41-25 Para 2     Review of Systems [name] [name]; 07/13/1854 31:49 AM) General Not Present- Appetite Loss, Chills, Fatigue, Fever, Night Sweats, Weight Gain and Weight Loss. Skin Not Present- Change in Wart/Mole, Dryness, Hives, Jaundice, New Lesions, Non-Healing Wounds, Rash and Ulcer. HEENT Present- Seasonal Allergies and Wears glasses/contact lenses. Not Present- Earache, Hearing Loss, Hoarseness, Nose Bleed, Oral Ulcers, Ringing in the Ears, Sinus Pain, Sore Throat, Visual Disturbances and Yellow Eyes. Respiratory Not Present- Bloody sputum, Chronic Cough, Difficulty Breathing, Snoring and Wheezing. Breast Not Present- Breast Mass, Breast Pain, Nipple Discharge and Skin Changes. Cardiovascular Not Present- Chest Pain, Difficulty Breathing Lying Down, Leg Cramps, Palpitations, Rapid Heart Rate, Shortness of Breath and Swelling of Extremities. Gastrointestinal Present- Chronic diarrhea and Hemorrhoids. Not Present- Abdominal Pain, Bloating, Bloody Stool, Change in Bowel Habits, Constipation, Difficulty Swallowing, Excessive gas, Gets full quickly at meals, Indigestion, Nausea, Rectal Pain and Vomiting. Musculoskeletal Present- Muscle Weakness. Not Present- Back Pain, Joint Pain, Joint Stiffness, Muscle Pain and Swelling of Extremities. Neurological Present- Decreased Memory, Numbness and Tingling. Not  Present- Fainting, Headaches, Seizures, Tremor, Trouble walking and Weakness. Psychiatric Not Present- Anxiety, Bipolar, Change in Sleep Pattern, Depression, Fearful and Frequent crying. Endocrine Not Present- Cold Intolerance, Excessive Hunger, Hair Changes, Heat Intolerance, Hot flashes and New Diabetes. Hematology Not Present- Easy Bruising, Excessive bleeding, Gland problems, HIV and Persistent Infections.  Vitals [name] [name];  07/13/2014 10:33 AM) 07/13/2014 10:33 AM Weight: 134.13 lb Height: 63in Body Surface Area: 1.64 m Body Mass Index: 23.76 kg/m Temp.: 4F  Pulse: 64 (Regular)  BP: 120/68 (Sitting, Left Arm, Standard)     Physical Exam [name]; 9/0/2111 10:59 AM)  General Mental Status-Alert. General Appearance-Consistent with stated age. Hydration-Well hydrated. Voice-Normal.  Head and Neck Head-normocephalic, atraumatic with no lesions or palpable masses. Trachea-midline. Thyroid Gland Characteristics - normal size and consistency.  Eye Eyeball - Bilateral-Extraocular movements intact. Sclera/Conjunctiva - Bilateral-No scleral icterus.  Chest and Lung Exam Chest and lung exam reveals -quiet, even and easy respiratory effort with no use of accessory muscles and on auscultation, normal breath sounds, no adventitious sounds and normal vocal resonance. Inspection Chest Wall - Normal. Back - normal.  Cardiovascular Cardiovascular examination reveals -normal heart sounds, regular rate and rhythm with no murmurs and normal pedal pulses bilaterally.  Abdomen Inspection Inspection of the abdomen reveals - No Hernias. Palpation/Percussion Palpation and Percussion of the abdomen reveal - Soft, Non Tender, No Rebound tenderness, No Rigidity (guarding) and No hepatosplenomegaly. Auscultation Auscultation of the abdomen reveals - Bowel sounds normal.  Rectal Anorectal Exam External - normal sphincter . Internal -  sphincter atony. Note: Good resting tone. No squeeze noted. Good pulse pressure noted.  Neurologic Neurologic evaluation reveals -alert and oriented x 3 with no impairment of recent or remote memory. Mental Status-Normal.  Musculoskeletal Global Assessment -Note:no gross deformities.  Normal Exam - Left-Upper Extremity Strength Normal and Lower Extremity Strength Normal. Normal Exam - Right-Upper Extremity Strength Normal and Lower Extremity Strength Normal.    Assessment & Plan [name]; 08/09/2078 11:01 AM)  FECAL INCONTINENCE (787.60  R15.9) Story: Complete your bowel diary for the next week and drop it by the office next Fri. Impression: 69 year old female who has tried medical management of her fecal incontinence for over a year now. She has had some success but continues to have episodes of leakage approximately every 3 days. This is associated with loose bowel movements. She is unable to control this with Imodium. We discussed sacral nerve stimulator with her. She would like to try this. I have given her a bowel diary to complete over the next week to evaluate her symptoms at baseline. We will try to get her scheduled for stage I of the stimulator the following week. Risk of this procedure include bleeding infection and failure of the device to improve symptoms.

## 2014-07-23 ENCOUNTER — Encounter (HOSPITAL_BASED_OUTPATIENT_CLINIC_OR_DEPARTMENT_OTHER): Payer: Self-pay | Admitting: *Deleted

## 2014-07-24 ENCOUNTER — Encounter (HOSPITAL_BASED_OUTPATIENT_CLINIC_OR_DEPARTMENT_OTHER): Payer: Self-pay | Admitting: *Deleted

## 2014-07-24 NOTE — Progress Notes (Signed)
NPO AFTER MN. ARRIVE AT 0915.  NEEDS ISTAT AND EKG. 

## 2014-07-26 ENCOUNTER — Ambulatory Visit (HOSPITAL_BASED_OUTPATIENT_CLINIC_OR_DEPARTMENT_OTHER)
Admission: RE | Admit: 2014-07-26 | Discharge: 2014-07-26 | Disposition: A | Discharge status: Home / Self Care | Payer: Medicare Other | Source: Ambulatory Visit | Attending: General Surgery | Admitting: General Surgery

## 2014-07-26 ENCOUNTER — Ambulatory Visit (HOSPITAL_COMMUNITY): Payer: Medicare Other

## 2014-07-26 ENCOUNTER — Ambulatory Visit (HOSPITAL_BASED_OUTPATIENT_CLINIC_OR_DEPARTMENT_OTHER): Payer: Medicare Other | Admitting: Anesthesiology

## 2014-07-26 ENCOUNTER — Encounter (HOSPITAL_BASED_OUTPATIENT_CLINIC_OR_DEPARTMENT_OTHER): Payer: Self-pay

## 2014-07-26 ENCOUNTER — Encounter (HOSPITAL_BASED_OUTPATIENT_CLINIC_OR_DEPARTMENT_OTHER)
Admission: RE | Disposition: A | Discharge status: Home / Self Care | Payer: Self-pay | Source: Ambulatory Visit | Attending: General Surgery

## 2014-07-26 ENCOUNTER — Other Ambulatory Visit: Payer: Self-pay

## 2014-07-26 DIAGNOSIS — K219 Gastro-esophageal reflux disease without esophagitis: Secondary | ICD-10-CM | POA: Insufficient documentation

## 2014-07-26 DIAGNOSIS — Z419 Encounter for procedure for purposes other than remedying health state, unspecified: Secondary | ICD-10-CM

## 2014-07-26 DIAGNOSIS — H409 Unspecified glaucoma: Secondary | ICD-10-CM | POA: Insufficient documentation

## 2014-07-26 DIAGNOSIS — R159 Full incontinence of feces: Secondary | ICD-10-CM | POA: Insufficient documentation

## 2014-07-26 DIAGNOSIS — L290 Pruritus ani: Secondary | ICD-10-CM | POA: Insufficient documentation

## 2014-07-26 DIAGNOSIS — K222 Esophageal obstruction: Secondary | ICD-10-CM | POA: Diagnosis not present

## 2014-07-26 DIAGNOSIS — I10 Essential (primary) hypertension: Secondary | ICD-10-CM | POA: Insufficient documentation

## 2014-07-26 DIAGNOSIS — E119 Type 2 diabetes mellitus without complications: Secondary | ICD-10-CM | POA: Diagnosis not present

## 2014-07-26 DIAGNOSIS — Z79899 Other long term (current) drug therapy: Secondary | ICD-10-CM | POA: Insufficient documentation

## 2014-07-26 DIAGNOSIS — Z4689 Encounter for fitting and adjustment of other specified devices: Secondary | ICD-10-CM | POA: Diagnosis not present

## 2014-07-26 DIAGNOSIS — Z7982 Long term (current) use of aspirin: Secondary | ICD-10-CM | POA: Insufficient documentation

## 2014-07-26 HISTORY — DX: Diverticulosis of large intestine without perforation or abscess without bleeding: K57.30

## 2014-07-26 HISTORY — DX: Personal history of adenomatous and serrated colon polyps: Z86.0101

## 2014-07-26 HISTORY — DX: Other allergy status, other than to drugs and biological substances: Z91.09

## 2014-07-26 HISTORY — DX: Presence of spectacles and contact lenses: Z97.3

## 2014-07-26 HISTORY — DX: Type 2 diabetes mellitus without complications: E11.9

## 2014-07-26 HISTORY — DX: Personal history of colonic polyps: Z86.010

## 2014-07-26 HISTORY — DX: Full incontinence of feces: R15.9

## 2014-07-26 HISTORY — DX: Unspecified glaucoma: H40.9

## 2014-07-26 HISTORY — DX: Personal history of other diseases of the digestive system: Z87.19

## 2014-07-26 LAB — POCT I-STAT 4, (NA,K, GLUC, HGB,HCT)
GLUCOSE: 138 mg/dL — AB (ref 70–99)
HEMATOCRIT: 39 % (ref 36.0–46.0)
Hemoglobin: 13.3 g/dL (ref 12.0–15.0)
Potassium: 3.3 mmol/L — ABNORMAL LOW (ref 3.5–5.1)
SODIUM: 141 mmol/L (ref 135–145)

## 2014-07-26 LAB — GLUCOSE, CAPILLARY: Glucose-Capillary: 116 mg/dL — ABNORMAL HIGH (ref 70–99)

## 2014-07-26 SURGERY — INSERTION, NEUROSTIMULATOR, SACRAL
Anesthesia: Monitor Anesthesia Care | Site: Buttocks

## 2014-07-26 MED ORDER — LIDOCAINE HCL (CARDIAC) 20 MG/ML IV SOLN
INTRAVENOUS | Status: DC | PRN
  Administered 2014-07-26: 50 mg via INTRAVENOUS

## 2014-07-26 MED ORDER — CEFAZOLIN SODIUM-DEXTROSE 2-3 GM-% IV SOLR
2.0000 g | INTRAVENOUS | Status: AC
  Administered 2014-07-26: 2 g via INTRAVENOUS
  Filled 2014-07-26: qty 50

## 2014-07-26 MED ORDER — FENTANYL CITRATE (PF) 100 MCG/2ML IJ SOLN
INTRAMUSCULAR | Status: DC | PRN
  Administered 2014-07-26 (×2): 50 ug via INTRAVENOUS

## 2014-07-26 MED ORDER — FENTANYL CITRATE (PF) 100 MCG/2ML IJ SOLN
25.0000 ug | INTRAMUSCULAR | Status: DC | PRN
  Filled 2014-07-26: qty 1

## 2014-07-26 MED ORDER — MIDAZOLAM HCL 2 MG/2ML IJ SOLN
INTRAMUSCULAR | Status: AC
  Filled 2014-07-26: qty 4

## 2014-07-26 MED ORDER — FENTANYL CITRATE (PF) 100 MCG/2ML IJ SOLN
INTRAMUSCULAR | Status: AC
  Filled 2014-07-26: qty 4

## 2014-07-26 MED ORDER — MIDAZOLAM HCL 5 MG/5ML IJ SOLN
INTRAMUSCULAR | Status: DC | PRN
  Administered 2014-07-26: 2 mg via INTRAVENOUS

## 2014-07-26 MED ORDER — ACETAMINOPHEN 10 MG/ML IV SOLN
INTRAVENOUS | Status: DC | PRN
  Administered 2014-07-26: 1000 mg via INTRAVENOUS

## 2014-07-26 MED ORDER — STERILE WATER FOR IRRIGATION IR SOLN
Status: DC | PRN
  Administered 2014-07-26: 500 mL

## 2014-07-26 MED ORDER — LACTATED RINGERS IV SOLN
INTRAVENOUS | Status: DC
  Administered 2014-07-26: 10:00:00 via INTRAVENOUS
  Filled 2014-07-26: qty 1000

## 2014-07-26 MED ORDER — HYDROCODONE-ACETAMINOPHEN 5-325 MG PO TABS: 1.0000 | ORAL_TABLET | ORAL | Status: DC | PRN

## 2014-07-26 MED ORDER — LACTATED RINGERS IV SOLN
INTRAVENOUS | Status: DC
  Filled 2014-07-26: qty 1000

## 2014-07-26 MED ORDER — BUPIVACAINE-EPINEPHRINE 0.5% -1:200000 IJ SOLN
INTRAMUSCULAR | Status: DC | PRN
  Administered 2014-07-26: 25 mL

## 2014-07-26 MED ORDER — KETOROLAC TROMETHAMINE 30 MG/ML IJ SOLN
INTRAMUSCULAR | Status: DC | PRN
  Administered 2014-07-26: 15 mg via INTRAVENOUS

## 2014-07-26 MED ORDER — CEFAZOLIN SODIUM-DEXTROSE 2-3 GM-% IV SOLR
INTRAVENOUS | Status: AC
  Filled 2014-07-26: qty 50

## 2014-07-26 MED ORDER — PROPOFOL 500 MG/50ML IV EMUL
INTRAVENOUS | Status: DC | PRN
  Administered 2014-07-26: 50 ug/kg/min via INTRAVENOUS

## 2014-07-26 SURGICAL SUPPLY — 37 items
BLADE HEX COATED 2.75 (ELECTRODE) ×3 IMPLANT
BLADE SURG 15 STRL LF DISP TIS (BLADE) ×1 IMPLANT
BLADE SURG 15 STRL SS (BLADE) ×2
CABLE TEST STIMULATION (UROLOGICAL SUPPLIES) ×3 IMPLANT
CABLE TWIST LOCK 25CM (UROLOGICAL SUPPLIES) ×3 IMPLANT
CHLORAPREP W/TINT 26ML (MISCELLANEOUS) ×3 IMPLANT
CLOSURE WOUND 1/2 X4 (GAUZE/BANDAGES/DRESSINGS) ×1
COVER BACK TABLE 60X90IN (DRAPES) ×3 IMPLANT
COVER MAYO STAND STRL (DRAPES) ×3 IMPLANT
DRAPE C-ARM 42X72 X-RAY (DRAPES) ×3 IMPLANT
DRAPE INCISE IOBAN 66X45 STRL (DRAPES) ×3 IMPLANT
DRAPE LAPAROSCOPIC ABDOMINAL (DRAPES) ×3 IMPLANT
DRAPE LG THREE QUARTER DISP (DRAPES) ×3 IMPLANT
DRAPE UTILITY XL STRL (DRAPES) ×3 IMPLANT
DRSG TEGADERM 4X4.75 (GAUZE/BANDAGES/DRESSINGS) ×3 IMPLANT
ELECT REM PT RETURN 9FT ADLT (ELECTROSURGICAL) ×3
ELECTRODE REM PT RTRN 9FT ADLT (ELECTROSURGICAL) ×1 IMPLANT
GLOVE BIO SURGEON STRL SZ 6.5 (GLOVE) ×2 IMPLANT
GLOVE BIO SURGEONS STRL SZ 6.5 (GLOVE) ×1
GLOVE INDICATOR 7.0 STRL GRN (GLOVE) ×3 IMPLANT
GOWN STRL REUS W/TWL 2XL LVL3 (GOWN DISPOSABLE) ×3 IMPLANT
INTRODUCER GUIDE DILATR SHEATH (SET/KITS/TRAYS/PACK) ×3 IMPLANT
KIT INTERSTIM LEAD TINED 28CM (Urological Implant) ×3 IMPLANT
LIQUID BAND (GAUZE/BANDAGES/DRESSINGS) ×3 IMPLANT
NEEDLE HYPO 22GX1.5 SAFETY (NEEDLE) ×3 IMPLANT
NEUROSTIMULATOR 1.7X2X.06 (UROLOGICAL SUPPLIES) ×3 IMPLANT
PACK BASIN DAY SURGERY FS (CUSTOM PROCEDURE TRAY) ×3 IMPLANT
PENCIL BUTTON HOLSTER BLD 10FT (ELECTRODE) ×3 IMPLANT
PROGRAMMER ANTENNA EXT (UROLOGICAL SUPPLIES) IMPLANT
PROGRAMMER STIMUL 2.2X1.1X3.7 (UROLOGICAL SUPPLIES) IMPLANT
SPONGE GAUZE 4X4 12PLY STER LF (GAUZE/BANDAGES/DRESSINGS) ×3 IMPLANT
STRIP CLOSURE SKIN 1/2X4 (GAUZE/BANDAGES/DRESSINGS) ×2 IMPLANT
SUT VICRYL 4-0 PS2 18IN ABS (SUTURE) ×3 IMPLANT
SYR BULB IRRIGATION 50ML (SYRINGE) ×3 IMPLANT
SYR CONTROL 10ML LL (SYRINGE) ×3 IMPLANT
TOWEL OR 17X24 6PK STRL BLUE (TOWEL DISPOSABLE) ×3 IMPLANT
WATER STERILE IRR 500ML POUR (IV SOLUTION) ×3 IMPLANT

## 2014-07-26 NOTE — Anesthesia Postprocedure Evaluation (Signed)
  Anesthesia Post-op Note  Patient: [name]  Procedure(s) Performed: Procedure(s) (LRB): INSERTION SACRAL NERVE STIMULATOR TEST WIRE/STAGE 1 (N/A)  Patient Location: PACU  Anesthesia Type: MAC  Level of Consciousness: awake and alert   Airway and Oxygen Therapy: Patient Spontanous Breathing  Post-op Pain: mild  Post-op Assessment: Post-op Vital signs reviewed, Patient's Cardiovascular Status Stable, Respiratory Function Stable, Patent Airway and No signs of Nausea or vomiting  Last Vitals:  Filed Vitals:   07/26/14 1215  BP: 112/82  Pulse: 66  Temp: 36.4 C  Resp: 20    Post-op Vital Signs: stable   Complications: No apparent anesthesia complications

## 2014-07-26 NOTE — Interval H&P Note (Signed)
History and Physical Interval Note:  07/26/2014 9:46 AM  [name]  has presented today for surgery, with the diagnosis of fecal incontinence  The various methods of treatment have been discussed with the patient and family. After consideration of risks, benefits and other options for treatment, the patient has consented to  Procedure(s): INSERTION SACRAL NERVE STIMULATOR TEST WIRE/STAGE 1 (N/A) as a surgical intervention .  The patient's history has been reviewed, patient examined, no change in status, stable for surgery.  I have reviewed the patient's chart and labs.  Questions were answered to the patient's satisfaction.    I have reviewed her bowel diary and agree that she is an appropriate candidate for surgery.  She had multiple episodes of fecal incontinence over the week test period.    Rosario Adie, MD  [facility]

## 2014-07-26 NOTE — H&P (View-Only) (Signed)
[name] 07/13/2014 10:31 AM Location: [facility] Patient #: [id number] DOB: October 14, 1945 Married / Language: [name] / Race: White Female History of Present Illness [name]; 07/07/3534 10:58 AM) Patient words: LU manometry.  The patient is a 69 year old female who presents with anal itching. This is a 69 year old female who I been treating over the last couple years for fecal incontinence. She underwent a renal ultrasound which showed no physical muscle defect. Her anal manometry shows an intact internal sphincter which functions well but no function of her external sphincter. She uses Imodium to control her loose bowel movements. She is unable to control her bowel movements when they are loose and has frequent leakage with this. She has severe urgency on a daily basis even with solid stool. She reports difficulty with control approximately every 3 days. Other Problems [name], [name]; 04/09/4313 40:08 AM) Hemorrhoids Oophorectomy Bilateral. Thyroid Disease  Past Surgical History [name], [name]; 09/10/6193 09:32 AM) Cataract Surgery Bilateral. Foot Surgery Bilateral. Hysterectomy (not due to cancer) - Complete MANOMETRY, ANORECTAL (67124) Outpatient.  Diagnostic Studies History [name], [name]; 08/11/996 33:82 AM) Colonoscopy 1-5 years ago Mammogram within last year  Allergies [name], [name]; 5/0/5397 67:34 AM) Lisinopril *ANTIHYPERTENSIVES* MetFORMIN HCl *ANTIDIABETICS*  Medication History [name], [name]; 04/14/3788 24:09 AM) Aspirin EC (81MG  Tablet DR, Oral) Active. Lipitor (10MG  Tablet, Oral) Active. Alphagan P (0.1% Solution, Ophthalmic) Active. Calcium Carbonate (1250MG  Tablet Chewable, Oral) Active. ZyrTEC Allergy (10MG  Capsule, Oral) Active. Vitamin D (Cholecalciferol) (1000UNIT Tablet, Oral) Active. Amaryl (1MG  Tablet, Oral) Active. Xalatan (0.005% Solution, Ophthalmic) Active. Multivitamins w/Iron  (Oral) Active. Align (4MG  Capsule, Oral) Active. Diovan HCT (80-12.5MG  Tablet, Oral) Active. OneTouch Delica Lancets Active. OneTouch Verio IQ (In Vitro) Active. Medications Reconciled  Social History [name], [name]; 10/07/5327 92:42 AM) Caffeine use Carbonated beverages, Tea. No alcohol use No drug use Tobacco use Never smoker.  Family History [name], [name]; 09/11/3417 62:22 AM) Diabetes Mellitus Mother. Hypertension Mother. Malignant Neoplasm Of Pancreas Father.  Pregnancy / Birth History [name], [name]; 12/12/9890 11:94 AM) [name] Age 2 Maternal age 52-25 Para 2     Review of Systems [name] [name]; 04/13/4079 44:81 AM) General Not Present- Appetite Loss, Chills, Fatigue, Fever, Night Sweats, Weight Gain and Weight Loss. Skin Not Present- Change in Wart/Mole, Dryness, Hives, Jaundice, New Lesions, Non-Healing Wounds, Rash and Ulcer. HEENT Present- Seasonal Allergies and Wears glasses/contact lenses. Not Present- Earache, Hearing Loss, Hoarseness, Nose Bleed, Oral Ulcers, Ringing in the Ears, Sinus Pain, Sore Throat, Visual Disturbances and Yellow Eyes. Respiratory Not Present- Bloody sputum, Chronic Cough, Difficulty Breathing, Snoring and Wheezing. Breast Not Present- Breast Mass, Breast Pain, Nipple Discharge and Skin Changes. Cardiovascular Not Present- Chest Pain, Difficulty Breathing Lying Down, Leg Cramps, Palpitations, Rapid Heart Rate, Shortness of Breath and Swelling of Extremities. Gastrointestinal Present- Chronic diarrhea and Hemorrhoids. Not Present- Abdominal Pain, Bloating, Bloody Stool, Change in Bowel Habits, Constipation, Difficulty Swallowing, Excessive gas, Gets full quickly at meals, Indigestion, Nausea, Rectal Pain and Vomiting. Musculoskeletal Present- Muscle Weakness. Not Present- Back Pain, Joint Pain, Joint Stiffness, Muscle Pain and Swelling of Extremities. Neurological Present- Decreased Memory, Numbness and Tingling. Not  Present- Fainting, Headaches, Seizures, Tremor, Trouble walking and Weakness. Psychiatric Not Present- Anxiety, Bipolar, Change in Sleep Pattern, Depression, Fearful and Frequent crying. Endocrine Not Present- Cold Intolerance, Excessive Hunger, Hair Changes, Heat Intolerance, Hot flashes and New Diabetes. Hematology Not Present- Easy Bruising, Excessive bleeding, Gland problems, HIV and Persistent Infections.  Vitals [name] [name];  07/13/2014 10:33 AM) 07/13/2014 10:33 AM Weight: 134.13 lb Height: 63in Body Surface Area: 1.64 m Body Mass Index: 23.76 kg/m Temp.: 44F  Pulse: 64 (Regular)  BP: 120/68 (Sitting, Left Arm, Standard)     Physical Exam [name]; 10/09/3003 10:59 AM)  General Mental Status-Alert. General Appearance-Consistent with stated age. Hydration-Well hydrated. Voice-Normal.  Head and Neck Head-normocephalic, atraumatic with no lesions or palpable masses. Trachea-midline. Thyroid Gland Characteristics - normal size and consistency.  Eye Eyeball - Bilateral-Extraocular movements intact. Sclera/Conjunctiva - Bilateral-No scleral icterus.  Chest and Lung Exam Chest and lung exam reveals -quiet, even and easy respiratory effort with no use of accessory muscles and on auscultation, normal breath sounds, no adventitious sounds and normal vocal resonance. Inspection Chest Wall - Normal. Back - normal.  Cardiovascular Cardiovascular examination reveals -normal heart sounds, regular rate and rhythm with no murmurs and normal pedal pulses bilaterally.  Abdomen Inspection Inspection of the abdomen reveals - No Hernias. Palpation/Percussion Palpation and Percussion of the abdomen reveal - Soft, Non Tender, No Rebound tenderness, No Rigidity (guarding) and No hepatosplenomegaly. Auscultation Auscultation of the abdomen reveals - Bowel sounds normal.  Rectal Anorectal Exam External - normal sphincter . Internal -  sphincter atony. Note: Good resting tone. No squeeze noted. Good pulse pressure noted.  Neurologic Neurologic evaluation reveals -alert and oriented x 3 with no impairment of recent or remote memory. Mental Status-Normal.  Musculoskeletal Global Assessment -Note:no gross deformities.  Normal Exam - Left-Upper Extremity Strength Normal and Lower Extremity Strength Normal. Normal Exam - Right-Upper Extremity Strength Normal and Lower Extremity Strength Normal.    Assessment & Plan [name]; 04/06/209 11:01 AM)  FECAL INCONTINENCE (787.60  R15.9) Story: Complete your bowel diary for the next week and drop it by the office next Fri. Impression: 69 year old female who has tried medical management of her fecal incontinence for over a year now. She has had some success but continues to have episodes of leakage approximately every 3 days. This is associated with loose bowel movements. She is unable to control this with Imodium. We discussed sacral nerve stimulator with her. She would like to try this. I have given her a bowel diary to complete over the next week to evaluate her symptoms at baseline. We will try to get her scheduled for stage I of the stimulator the following week. Risk of this procedure include bleeding infection and failure of the device to improve symptoms.

## 2014-07-26 NOTE — Anesthesia Preprocedure Evaluation (Addendum)
Anesthesia Evaluation  Patient identified by MRN, date of birth, ID band Patient awake    Reviewed: Allergy & Precautions, H&P , NPO status , Patient's Chart, lab work & pertinent test results  Airway Mallampati: II  TM Distance: >3 FB Neck ROM: full    Dental no notable dental hx. (+) Teeth Intact, Dental Advisory Given   Pulmonary neg pulmonary ROS,  breath sounds clear to auscultation  Pulmonary exam normal       Cardiovascular Exercise Tolerance: Good hypertension, Pt. on medications Rhythm:regular Rate:Normal     Neuro/Psych glaucoma negative neurological ROS  negative psych ROS   GI/Hepatic negative GI ROS, Neg liver ROS, GERD-  ,Esophageal stricture   Endo/Other  diabetes, Well Controlled, Type 2, Oral Hypoglycemic Agents  Renal/GU negative Renal ROS  negative genitourinary   Musculoskeletal   Abdominal   Peds  Hematology negative hematology ROS (+)   Anesthesia Other Findings   Reproductive/Obstetrics negative OB ROS                            Anesthesia Physical Anesthesia Plan  ASA: III  Anesthesia Plan: MAC   Post-op Pain Management:    Induction:   Airway Management Planned: Simple Face Mask  Additional Equipment:   Intra-op Plan:   Post-operative Plan:   Informed Consent: I have reviewed the patients History and Physical, chart, labs and discussed the procedure including the risks, benefits and alternatives for the proposed anesthesia with the patient or authorized representative who has indicated his/her understanding and acceptance.   Dental Advisory Given  Plan Discussed with: CRNA and Surgeon  Anesthesia Plan Comments:         Anesthesia Quick Evaluation

## 2014-07-26 NOTE — Anesthesia Procedure Notes (Signed)
Performed by: Bethena Roys T Oxygen Delivery Method: Nasal cannula Placement Confirmation: CO2 detector and positive ETCO2

## 2014-07-26 NOTE — Discharge Instructions (Addendum)
GENERAL SURGERY: POST OP INSTRUCTIONS  1. DIET: Follow a light bland diet the first 24 hours after arrival home, such as soup, liquids, crackers, etc.  Be sure to include lots of fluids daily.  Avoid fast food or heavy meals as your are more likely to get nauseated.   2. Take your usually prescribed home medications unless otherwise directed. 3. PAIN CONTROL: a. Pain is best controlled by a usual combination of three different methods TOGETHER: i. Ice/Heat ii. Over the counter pain medication iii. Prescription pain medication b. Most patients will experience some swelling and bruising around the incisions.  Ice packs or heating pads (30-60 minutes up to 6 times a day) will help. Use ice for the first few days to help decrease swelling and bruising, then switch to heat to help relax tight/sore spots and speed recovery.  Some people prefer to use ice alone, heat alone, alternating between ice & heat.  Experiment to what works for you.  Swelling and bruising can take several weeks to resolve.   c. It is helpful to take an over-the-counter pain medication regularly for the first few weeks.  Choose one of the following that works best for you: i. Naproxen (Aleve, etc)  Two 220mg  tabs twice a day ii. Ibuprofen (Advil, etc) Three 200mg  tabs four times a day (every meal & bedtime) d. A  prescription for pain medication (such as Percocet, oxycodone, hydrocodone, etc) should be given to you upon discharge.  Take your pain medication as prescribed.  i. If you are having problems/concerns with the prescription medicine (does not control pain, nausea, vomiting, rash, itching, etc), please call us [phone] to see if we need to switch you to a different pain medicine that will work better for you and/or control your side effect better. ii. If you need a refill on your pain medication, please contact your pharmacy.  They will contact our office to request authorization. Prescriptions will not be filled after 5  pm or on week-ends. 4. Avoid getting constipated.  Between the surgery and the pain medications, it is common to experience some constipation.  Increasing fluid intake and taking a fiber supplement (such as Metamucil, Citrucel, FiberCon, MiraLax, etc) 1-2 times a day regularly will usually help prevent this problem from occurring.  A mild laxative (prune juice, Milk of Magnesia, MiraLax, etc) should be taken according to package directions if there are no bowel movements after 48 hours.   5. Keep dressing dry.  Do not shower.  Ok for sponge bath only.  6. ACTIVITIES as tolerated:   a. You may resume regular (light) daily activities beginning the next day--such as daily self-care, walking, climbing stairs.  Avoid strenuous activity such as sit-ups, heavy lifting, contact sports, etc  Refrain from any heavy lifting or straining.   b. DO NOT PUSH THROUGH PAIN.  Let pain be your guide: If it hurts to do something, don't do it.  Pain is your body warning you to avoid that activity for another week until the pain goes down. c. You may drive when you are no longer taking prescription pain medication, you can comfortably wear a seatbelt, and you can safely maneuver your car and apply brakes. d. Dennis Bast may have sexual intercourse when it is comfortable.  7. FOLLOW UP in our office a. Please call [facility] at [phone] to set up an appointment to see your surgeon in the office for a follow-up appointment approximately 2-3 weeks after your surgery. b. Make sure that  you call for this appointment the day you arrive home to insure a convenient appointment time. 9. IF YOU HAVE DISABILITY OR FAMILY LEAVE FORMS, BRING THEM TO THE OFFICE FOR PROCESSING.  DO NOT GIVE THEM TO YOUR DOCTOR.   WHEN TO CALL us [phone]: 1. Poor pain control 2. Reactions / problems with new medications (rash/itching, nausea, etc)  3. Fever over 101.5 F (38.5 C) 4. Worsening swelling or bruising 5. Continued bleeding from  incision. 6. Increased pain, redness, or drainage from the incision   The clinic staff is available to answer your questions during regular business hours (8:30am-5pm).  Please dont hesitate to call and ask to speak to one of our nurses for clinical concerns.   If you have a medical emergency, go to the nearest emergency room or call 911.  A surgeon from [facility] is always on call at the [facility], [place], [place], [place], [place]  [address] ? MAIN: [phone] ? TOLL FREE: [phone] ?  FAX [phone] [email or web address]     Post Anesthesia Home Care Instructions  Activity: Get plenty of rest for the remainder of the day. A responsible adult should stay with you for 24 hours following the procedure.  For the next 24 hours, DO NOT: -Drive a car -Paediatric nurse -Drink alcoholic beverages -Take any medication unless instructed by your physician -Make any legal decisions or sign important papers.  Meals: Start with liquid foods such as gelatin or soup. Progress to regular foods as tolerated. Avoid greasy, spicy, heavy foods. If nausea and/or vomiting occur, drink only clear liquids until the nausea and/or vomiting subsides. Call your physician if vomiting continues.  Special Instructions/Symptoms: Your throat may feel dry or sore from the anesthesia or the breathing tube placed in your throat during surgery. If this causes discomfort, gargle with warm salt water. The discomfort should disappear within 24 hours.  If you had a scopolamine patch placed behind your ear for the management of post- operative nausea and/or vomiting:  1. The medication in the patch is effective for 72 hours, after which it should be removed.  Wrap patch in a tissue and discard in the trash. Wash hands thoroughly with soap and water. 2. You may remove the patch earlier than 72 hours if you experience unpleasant side  effects which may include dry mouth, dizziness or visual disturbances. 3. Avoid touching the patch. Wash your hands with soap and water after contact with the patch.

## 2014-07-26 NOTE — Transfer of Care (Signed)
Immediate Anesthesia Transfer of Care Note  Patient: [name]  Procedure(s) Performed: Procedure(s): INSERTION SACRAL NERVE STIMULATOR TEST WIRE/STAGE 1 (N/A)  Patient Location: PACU  Anesthesia Type:General  Level of Consciousness: awake, alert  and oriented  Airway & Oxygen Therapy: Patient Spontanous Breathing and Patient connected to nasal cannula oxygen  Post-op Assessment: Report given to RN  Post vital signs: Reviewed and stable  Last Vitals:  Filed Vitals:   07/26/14 0912  BP: 134/44  Pulse: 69  Temp: 36.4 C  Resp: 16    Complications: No apparent anesthesia complications

## 2014-07-26 NOTE — Op Note (Signed)
07/26/2014  12:05 PM  PATIENT:  [name]  69 y.o. female  Patient Care Team: Marin Olp, MD as PCP - General (Family Medicine)  PRE-OPERATIVE DIAGNOSIS:  fecal incontinence  POST-OPERATIVE DIAGNOSIS:  fecal incontinence  PROCEDURE:  INSERTION SACRAL NERVE STIMULATOR TEST WIRE/STAGE 1  Surgeon(s): Leighton Ruff, MD  ASSISTANT: none   ANESTHESIA:   local and MAC  EBL: min  Total I/O In: 200 [I.V.:200] Out: -   DRAINS: none   SPECIMEN:  No Specimen  DISPOSITION OF SPECIMEN:  N/A  COUNTS:  YES  PLAN OF CARE: Discharge to home after PACU  PATIENT DISPOSITION:  PACU - hemodynamically stable.  INDICATION: Fecal incontinence refractory to medical treatment.  The patient suffers from fecal incontinence ~ 1.4 times per day.  she has tried fiber supplements for over a year to manage this without lasting success.  Anal manometry shows functioning internal sphincter but poor function of external sphincter.  Anal US shows both sphincters are intact.  The risks and benefits of the surgery were described to the patient and consent was signed and placed on chart prior to the OR.  DESCRIPTION: the patient was identified in the preoperative holding area and taken to the OR where they were laid prone on the operating room table.  MAC anesthesia was induced without difficulty. SCDs were also noted to be in place prior to the initiation of anesthesia.  Pillows were placed under lower abdomen to flatten the sacrum and under shins to allow the toes to dangle freely. A ground pad was placed on the bottom of the patient's foot and the proximal ends of the j-hook patient cable were connected to the ground pad and the external neurostimulator (ENS).The patient was then prepped and draped in the usual sterile fashion.  A surgical timeout was performed indicating the correct patient, procedure, positioning and need for preoperative antibiotics.  The c-arm was moved into AP position to  provide fluoroscopic guidance of the sacrum. The medial edges of the foramina were identified and marked. The c-arm was then moved into the lateral position to identify the S3 foramen. Once the needle entry point was determined, local injection of Marcaine with epinephrine was administered bilaterally. A foramen needle was placed in the superior, medial aspect of the right S3 foramen and appropriate needle depth was visualized utilizing fluoroscopy. Proper S3 needle location was also confirmed by direct observation of the lifting of the perineum or "bellowing," utilizing the j-hook patient cable, the external neurostimulator and Verify controller.   Plantar flexion of the great toe was lacking but she was having sensation in her rectum.  We tested the left side as well, but got better results with the right. The foramen needle stylet was then removed and a directional guide was placed through the needle using markers on the guide to assure appropriate depth. The foramen needle was removed by sliding over the directional guide. A small incision was made peripherally to the directional guide through the skin. The lead introducer with dilator was placed over the directional guide and utilizing fluoroscopic guidance, the lead introducer was advanced until the radiopaque mark was half-way through the foramen. The dilator was removed along with the directional guide. Using fluoroscopy, the tined lead with the bent stylet.  This was placed through the introducer until electrodes two and three straddled the anterior surface of the sacrum. All four electrodes were tested, observing "bellows" and rectal sensation utilizing the j-hook patient cable and the external neurostimulator with  Verify controller. After satisfactory lead positioning was confirmed, the introducer was retracted over the lead under continuous fluoroscopy, deploying the tines into presacral tissue. Retesting of all four electrodes confirming appropriate  responses was completed.   The potential internal neurostimulator pocket site was identified below the iliac crest and lateral to the sacrum. Local anesthesia was administered and an incision was made into the subcutaneous tissue creating a connection site. Blunt dissection was used to create a small pocket with hemostasis achieved.  A tunneling tool with sheath was placed from the lead exit site subcutaneously to the small incised pocket site. The tunneling tool was removed and the lead was fed through the sheath, exiting at the pocket connection site. The sheath was removed. The lead was cleaned and dried. A protective boot was placed over the lead; the lead was inserted into the temporary percutaneous extension with visual confirmation of blue tip advancement. The four setscrews were tightened with the torque wrench until audible clicks were heard. The boot was slid over the connection and 0 silk ties were sutured to the boot grooves on either side of connection. Using the tunneling tool and sheath, a subcutaneous tunnel was created from the pocket site to the contralateral buttock and exited at a localized site. The percutaneous extension was placed through the sheath, the sheath removed, and the extension exiting the site. The connection components were placed into the incision. The incisions were closed with 3-0 Vicryl subcuticular sutures and 4-0 Vicryl skin sutures. The incisions were closed with Dermabond.  Adhesive strips and gauze were placed over the percutaneous extension exit site and secured with transparent dressing.   The percutaneous extension was attached to the external white twist lock cable. Gauze was placed under the twist-lock connection with cable strain relief and secured using transparent dressing. The twist lock cable was then plugged into the external neuorstimulator. The patient was transferred to PACU in satisfactory condition. Using the Verify controller and external  neurostimulator, the patient was programmed to the electrode of optimum sensation and provided utilization instructions prior to discharge. Patient will complete a voiding diary during testing period to help document results of this test procedure.

## 2014-08-01 ENCOUNTER — Encounter (HOSPITAL_BASED_OUTPATIENT_CLINIC_OR_DEPARTMENT_OTHER): Payer: Self-pay | Admitting: *Deleted

## 2014-08-01 ENCOUNTER — Other Ambulatory Visit: Payer: Self-pay | Admitting: General Surgery

## 2014-08-01 NOTE — Progress Notes (Signed)
NPO AFTER MN. ARRIVE AT 7622. NEEDS ISTAT. CURRENT EKG IN CHART AND EPIC.

## 2014-08-03 ENCOUNTER — Encounter (HOSPITAL_BASED_OUTPATIENT_CLINIC_OR_DEPARTMENT_OTHER)
Admission: RE | Disposition: A | Discharge status: Home / Self Care | Payer: Self-pay | Source: Ambulatory Visit | Attending: General Surgery

## 2014-08-03 ENCOUNTER — Ambulatory Visit (HOSPITAL_BASED_OUTPATIENT_CLINIC_OR_DEPARTMENT_OTHER): Payer: Medicare Other | Admitting: Anesthesiology

## 2014-08-03 ENCOUNTER — Ambulatory Visit (HOSPITAL_BASED_OUTPATIENT_CLINIC_OR_DEPARTMENT_OTHER)
Admission: RE | Admit: 2014-08-03 | Discharge: 2014-08-03 | Disposition: A | Discharge status: Home / Self Care | Payer: Medicare Other | Source: Ambulatory Visit | Attending: General Surgery | Admitting: General Surgery

## 2014-08-03 ENCOUNTER — Encounter (HOSPITAL_BASED_OUTPATIENT_CLINIC_OR_DEPARTMENT_OTHER): Payer: Self-pay | Admitting: *Deleted

## 2014-08-03 DIAGNOSIS — I10 Essential (primary) hypertension: Secondary | ICD-10-CM | POA: Diagnosis not present

## 2014-08-03 DIAGNOSIS — R151 Fecal smearing: Secondary | ICD-10-CM | POA: Diagnosis not present

## 2014-08-03 DIAGNOSIS — K219 Gastro-esophageal reflux disease without esophagitis: Secondary | ICD-10-CM | POA: Insufficient documentation

## 2014-08-03 DIAGNOSIS — K449 Diaphragmatic hernia without obstruction or gangrene: Secondary | ICD-10-CM | POA: Insufficient documentation

## 2014-08-03 DIAGNOSIS — M1990 Unspecified osteoarthritis, unspecified site: Secondary | ICD-10-CM | POA: Insufficient documentation

## 2014-08-03 DIAGNOSIS — Z7982 Long term (current) use of aspirin: Secondary | ICD-10-CM | POA: Insufficient documentation

## 2014-08-03 DIAGNOSIS — R152 Fecal urgency: Secondary | ICD-10-CM | POA: Diagnosis not present

## 2014-08-03 DIAGNOSIS — Z79899 Other long term (current) drug therapy: Secondary | ICD-10-CM | POA: Insufficient documentation

## 2014-08-03 DIAGNOSIS — E119 Type 2 diabetes mellitus without complications: Secondary | ICD-10-CM | POA: Diagnosis not present

## 2014-08-03 DIAGNOSIS — R159 Full incontinence of feces: Secondary | ICD-10-CM | POA: Diagnosis not present

## 2014-08-03 LAB — GLUCOSE, CAPILLARY: GLUCOSE-CAPILLARY: 110 mg/dL — AB (ref 70–99)

## 2014-08-03 LAB — POCT I-STAT 4, (NA,K, GLUC, HGB,HCT)
GLUCOSE: 132 mg/dL — AB (ref 70–99)
HEMATOCRIT: 37 % (ref 36.0–46.0)
HEMOGLOBIN: 12.6 g/dL (ref 12.0–15.0)
POTASSIUM: 3.5 mmol/L (ref 3.5–5.1)
Sodium: 141 mmol/L (ref 135–145)

## 2014-08-03 SURGERY — INSERTION, PULSE GENERATOR, PERIPHERAL NERVE STIMULATOR
Anesthesia: Monitor Anesthesia Care | Site: Buttocks

## 2014-08-03 MED ORDER — FENTANYL CITRATE (PF) 100 MCG/2ML IJ SOLN
INTRAMUSCULAR | Status: AC
  Filled 2014-08-03: qty 2

## 2014-08-03 MED ORDER — CEFAZOLIN SODIUM-DEXTROSE 2-3 GM-% IV SOLR
INTRAVENOUS | Status: AC
  Filled 2014-08-03: qty 50

## 2014-08-03 MED ORDER — BUPIVACAINE-EPINEPHRINE 0.5% -1:200000 IJ SOLN
INTRAMUSCULAR | Status: DC | PRN
  Administered 2014-08-03: 13 mL

## 2014-08-03 MED ORDER — CEFAZOLIN SODIUM-DEXTROSE 2-3 GM-% IV SOLR
2.0000 g | INTRAVENOUS | Status: AC
  Administered 2014-08-03: 2 g via INTRAVENOUS
  Filled 2014-08-03: qty 50

## 2014-08-03 MED ORDER — FENTANYL CITRATE (PF) 100 MCG/2ML IJ SOLN
25.0000 ug | INTRAMUSCULAR | Status: DC | PRN
  Filled 2014-08-03: qty 1

## 2014-08-03 MED ORDER — LACTATED RINGERS IV SOLN
INTRAVENOUS | Status: DC
  Administered 2014-08-03: 11:00:00 via INTRAVENOUS
  Filled 2014-08-03: qty 1000

## 2014-08-03 MED ORDER — OXYCODONE HCL 5 MG/5ML PO SOLN
5.0000 mg | Freq: Once | ORAL | Status: DC | PRN
  Filled 2014-08-03: qty 5

## 2014-08-03 MED ORDER — KETAMINE HCL 10 MG/ML IJ SOLN
INTRAMUSCULAR | Status: AC
  Filled 2014-08-03: qty 1

## 2014-08-03 MED ORDER — SODIUM CHLORIDE 0.9 % IJ SOLN
3.0000 mL | INTRAMUSCULAR | Status: DC | PRN
  Filled 2014-08-03: qty 3

## 2014-08-03 MED ORDER — MIDAZOLAM HCL 2 MG/2ML IJ SOLN
INTRAMUSCULAR | Status: AC
  Filled 2014-08-03: qty 2

## 2014-08-03 MED ORDER — ONDANSETRON HCL 4 MG/2ML IJ SOLN
4.0000 mg | Freq: Four times a day (QID) | INTRAMUSCULAR | Status: DC | PRN
  Filled 2014-08-03: qty 2

## 2014-08-03 MED ORDER — OXYCODONE HCL 5 MG PO TABS
5.0000 mg | ORAL_TABLET | Freq: Once | ORAL | Status: DC | PRN
  Filled 2014-08-03: qty 1

## 2014-08-03 MED ORDER — EPHEDRINE SULFATE 50 MG/ML IJ SOLN
INTRAMUSCULAR | Status: DC | PRN
  Administered 2014-08-03: 10 mg via INTRAVENOUS

## 2014-08-03 MED ORDER — ONDANSETRON HCL 4 MG/2ML IJ SOLN
INTRAMUSCULAR | Status: DC | PRN
  Administered 2014-08-03: 4 mg via INTRAVENOUS

## 2014-08-03 MED ORDER — LIDOCAINE HCL (CARDIAC) 20 MG/ML IV SOLN
INTRAVENOUS | Status: DC | PRN
  Administered 2014-08-03: 50 mg via INTRAVENOUS

## 2014-08-03 MED ORDER — SODIUM CHLORIDE 0.9 % IJ SOLN
3.0000 mL | Freq: Two times a day (BID) | INTRAMUSCULAR | Status: DC
  Filled 2014-08-03: qty 3

## 2014-08-03 MED ORDER — OXYCODONE HCL 5 MG PO TABS
5.0000 mg | ORAL_TABLET | ORAL | Status: DC | PRN
  Filled 2014-08-03: qty 2

## 2014-08-03 MED ORDER — MIDAZOLAM HCL 5 MG/5ML IJ SOLN
INTRAMUSCULAR | Status: DC | PRN
  Administered 2014-08-03: 2 mg via INTRAVENOUS

## 2014-08-03 MED ORDER — FENTANYL CITRATE (PF) 100 MCG/2ML IJ SOLN
INTRAMUSCULAR | Status: DC | PRN
  Administered 2014-08-03: 12.5 ug via INTRAVENOUS
  Administered 2014-08-03: 50 ug via INTRAVENOUS
  Administered 2014-08-03: 25 ug via INTRAVENOUS
  Administered 2014-08-03: 12.5 ug via INTRAVENOUS

## 2014-08-03 MED ORDER — ACETAMINOPHEN 650 MG RE SUPP
650.0000 mg | RECTAL | Status: DC | PRN
  Filled 2014-08-03: qty 1

## 2014-08-03 MED ORDER — ACETAMINOPHEN 325 MG PO TABS
650.0000 mg | ORAL_TABLET | ORAL | Status: DC | PRN
  Filled 2014-08-03: qty 2

## 2014-08-03 MED ORDER — STERILE WATER FOR IRRIGATION IR SOLN
Status: DC | PRN
  Administered 2014-08-03: 500 mL

## 2014-08-03 MED ORDER — ACETAMINOPHEN 10 MG/ML IV SOLN
INTRAVENOUS | Status: DC | PRN
  Administered 2014-08-03: 1000 mg via INTRAVENOUS

## 2014-08-03 MED ORDER — KETOROLAC TROMETHAMINE 30 MG/ML IJ SOLN
INTRAMUSCULAR | Status: DC | PRN
  Administered 2014-08-03: 15 mg via INTRAVENOUS

## 2014-08-03 MED ORDER — PROPOFOL 500 MG/50ML IV EMUL
INTRAVENOUS | Status: DC | PRN
  Administered 2014-08-03: 50 ug/kg/min via INTRAVENOUS

## 2014-08-03 MED ORDER — DEXAMETHASONE SODIUM PHOSPHATE 4 MG/ML IJ SOLN
INTRAMUSCULAR | Status: DC | PRN
  Administered 2014-08-03: 5 mg via INTRAVENOUS

## 2014-08-03 MED ORDER — SODIUM CHLORIDE 0.9 % IV SOLN
250.0000 mL | INTRAVENOUS | Status: DC | PRN
  Filled 2014-08-03: qty 250

## 2014-08-03 SURGICAL SUPPLY — 51 items
BAG URINE LEG 500ML (DRAIN) IMPLANT
BENZOIN TINCTURE PRP APPL 2/3 (GAUZE/BANDAGES/DRESSINGS) ×3 IMPLANT
BLADE HEX COATED 2.75 (ELECTRODE) ×3 IMPLANT
BLADE SURG 15 STRL LF DISP TIS (BLADE) ×1 IMPLANT
BLADE SURG 15 STRL SS (BLADE) ×2
CATH FOLEY 2WAY SLVR  5CC 16FR (CATHETERS)
CATH FOLEY 2WAY SLVR 5CC 16FR (CATHETERS) IMPLANT
CHLORAPREP W/TINT 26ML (MISCELLANEOUS) ×3 IMPLANT
CLOSURE WOUND 1/2 X4 (GAUZE/BANDAGES/DRESSINGS)
COVER BACK TABLE 60X90IN (DRAPES) ×3 IMPLANT
COVER MAYO STAND STRL (DRAPES) ×3 IMPLANT
COVER PROBE W GEL 5X96 (DRAPES) ×3 IMPLANT
DRAPE C-ARM 42X72 X-RAY (DRAPES) IMPLANT
DRAPE INCISE IOBAN 66X45 STRL (DRAPES) ×3 IMPLANT
DRAPE LAPAROSCOPIC ABDOMINAL (DRAPES) ×3 IMPLANT
DRAPE LG THREE QUARTER DISP (DRAPES) IMPLANT
DRAPE UTILITY XL STRL (DRAPES) ×3 IMPLANT
DRSG TEGADERM 4X4.75 (GAUZE/BANDAGES/DRESSINGS) ×3 IMPLANT
ELECT REM PT RETURN 9FT ADLT (ELECTROSURGICAL) ×3
ELECTRODE REM PT RTRN 9FT ADLT (ELECTROSURGICAL) ×1 IMPLANT
GLOVE BIO SURGEON STRL SZ 6.5 (GLOVE) ×2 IMPLANT
GLOVE BIO SURGEON STRL SZ7 (GLOVE) ×3 IMPLANT
GLOVE BIO SURGEON STRL SZ7.5 (GLOVE) ×3 IMPLANT
GLOVE BIO SURGEONS STRL SZ 6.5 (GLOVE) ×1
GLOVE INDICATOR 7.0 STRL GRN (GLOVE) ×6 IMPLANT
GLOVE SURG SS PI 7.5 STRL IVOR (GLOVE) ×3 IMPLANT
GOWN STRL REUS W/ TWL LRG LVL3 (GOWN DISPOSABLE) ×2 IMPLANT
GOWN STRL REUS W/TWL 2XL LVL3 (GOWN DISPOSABLE) ×3 IMPLANT
GOWN STRL REUS W/TWL LRG LVL3 (GOWN DISPOSABLE) ×4
INTRODUCER GUIDE DILATR SHEATH (SET/KITS/TRAYS/PACK) IMPLANT
LIQUID BAND (GAUZE/BANDAGES/DRESSINGS) ×3 IMPLANT
NEEDLE FORAMEN 20GA 3.5  9CM (NEEDLE) IMPLANT
NEEDLE FORAMEN 20GA 5  12.5CM (NEEDLE) IMPLANT
NEEDLE HYPO 22GX1.5 SAFETY (NEEDLE) ×3 IMPLANT
PACK BASIN DAY SURGERY FS (CUSTOM PROCEDURE TRAY) ×3 IMPLANT
PENCIL BUTTON HOLSTER BLD 10FT (ELECTRODE) ×3 IMPLANT
PROGRAMMER ANTENNA EXT (UROLOGICAL SUPPLIES) ×3 IMPLANT
PROGRAMMER STIMUL 2.2X1.1X3.7 (UROLOGICAL SUPPLIES) ×3 IMPLANT
SPONGE GAUZE 4X4 12PLY STER LF (GAUZE/BANDAGES/DRESSINGS) IMPLANT
STAPLER VISISTAT 35W (STAPLE) IMPLANT
STIMULATOR INTERSTIM 2X1.7X.3 (Orthopedic Implant) ×3 IMPLANT
STRIP CLOSURE SKIN 1/2X4 (GAUZE/BANDAGES/DRESSINGS) IMPLANT
SUT SILK 2 0 TIES 17X18 (SUTURE)
SUT SILK 2-0 18XBRD TIE BLK (SUTURE) IMPLANT
SUT VIC AB 3-0 SH 27 (SUTURE) ×4
SUT VIC AB 3-0 SH 27X BRD (SUTURE) ×2 IMPLANT
SUT VICRYL 4-0 PS2 18IN ABS (SUTURE) ×3 IMPLANT
SYR BULB IRRIGATION 50ML (SYRINGE) ×3 IMPLANT
SYR CONTROL 10ML LL (SYRINGE) ×3 IMPLANT
TOWEL OR 17X24 6PK STRL BLUE (TOWEL DISPOSABLE) ×6 IMPLANT
WATER STERILE IRR 500ML POUR (IV SOLUTION) ×3 IMPLANT

## 2014-08-03 NOTE — Anesthesia Procedure Notes (Signed)
Procedure Name: MAC Date/Time: 08/03/2014 12:00 PM Performed by: Mechele Claude Pre-anesthesia Checklist: Patient identified, Timeout performed, Emergency Drugs available, Suction available and Patient being monitored Oxygen Delivery Method: Nasal cannula Placement Confirmation: positive ETCO2

## 2014-08-03 NOTE — Interval H&P Note (Signed)
History and Physical Interval Note:  08/03/2014 11:51 AM  [name]  has presented today for surgery, with the diagnosis of fecal incontience  The various methods of treatment have been discussed with the patient and family. After consideration of risks, benefits and other options for treatment, the patient has consented to  Procedure(s): INSERTION OF PERIPHERAL NEUROSTIMULATOR PULSE GENERATOR (N/A) as a surgical intervention .  The patient's history has been reviewed, patient examined, no change in status, stable for surgery.  I have reviewed the patient's chart and labs.  Questions were answered to the patient's satisfaction.    Pt with 3 episodes of fecal leakage in 8 days with stimulator in place.  This is an average of 0.4 episodes a day, which is down from her baseline of 1.4 episodes per day and >50% improvement.  Rosario Adie, MD  [facility]

## 2014-08-03 NOTE — H&P (View-Only) (Signed)
[name] 07/13/2014 10:31 AM Location: [facility] Patient #: [phone] DOB: June 05, 1945 Married / Language: [name] / Race: White Female History of Present Illness [name]; 11/07/6960 10:58 AM) Patient words: LU manometry.  The patient is a 69 year old female who presents with anal itching. This is a 69 year old female who I been treating over the last couple years for fecal incontinence. She underwent a renal ultrasound which showed no physical muscle defect. Her anal manometry shows an intact internal sphincter which functions well but no function of her external sphincter. She uses Imodium to control her loose bowel movements. She is unable to control her bowel movements when they are loose and has frequent leakage with this. She has severe urgency on a daily basis even with solid stool. She reports difficulty with control approximately every 3 days. Other Problems Meghan Lorenzo, [name]; 12/10/2839 32:44 AM) Hemorrhoids Oophorectomy Bilateral. Thyroid Disease  Past Surgical History Meghan Lorenzo, [name]; 0/04/270 53:66 AM) Cataract Surgery Bilateral. Foot Surgery Bilateral. Hysterectomy (not due to cancer) - Complete MANOMETRY, ANORECTAL (44034) Outpatient.  Diagnostic Studies History Meghan Lorenzo, [name]; 10/07/2593 63:87 AM) Colonoscopy 1-5 years ago Mammogram within last year  Allergies Meghan Lorenzo, [name]; 08/10/4330 95:18 AM) Lisinopril *ANTIHYPERTENSIVES* MetFORMIN HCl *ANTIDIABETICS*  Medication History Meghan Lorenzo, [name]; 11/08/1658 63:01 AM) Aspirin EC (81MG  Tablet DR, Oral) Active. Lipitor (10MG  Tablet, Oral) Active. Alphagan P (0.1% Solution, Ophthalmic) Active. Calcium Carbonate (1250MG  Tablet Chewable, Oral) Active. ZyrTEC Allergy (10MG  Capsule, Oral) Active. Vitamin D (Cholecalciferol) (1000UNIT Tablet, Oral) Active. Amaryl (1MG  Tablet, Oral) Active. Xalatan (0.005% Solution, Ophthalmic) Active. Multivitamins w/Iron  (Oral) Active. Align (4MG  Capsule, Oral) Active. Diovan HCT (80-12.5MG  Tablet, Oral) Active. OneTouch Delica Lancets Active. OneTouch Verio IQ (In Vitro) Active. Medications Reconciled  Social History Meghan Lorenzo, [name]; 6/0/1093 23:55 AM) Caffeine use Carbonated beverages, Tea. No alcohol use No drug use Tobacco use Never smoker.  Family History Meghan Lorenzo, [name]; 10/06/2200 54:27 AM) Diabetes Mellitus Mother. Hypertension Mother. Malignant Neoplasm Of Pancreas Father.  Pregnancy / Birth History Meghan Lorenzo, [name]; 0/09/2374 28:31 AM) [name] Age 2 Maternal age 76-25 Para 2     Review of Systems Meghan Lorenzo [name]; 08/05/7614 07:37 AM) General Not Present- Appetite Loss, Chills, Fatigue, Fever, Night Sweats, Weight Gain and Weight Loss. Skin Not Present- Change in Wart/Mole, Dryness, Hives, Jaundice, New Lesions, Non-Healing Wounds, Rash and Ulcer. HEENT Present- Seasonal Allergies and Wears glasses/contact lenses. Not Present- Earache, Hearing Loss, Hoarseness, Nose Bleed, Oral Ulcers, Ringing in the Ears, Sinus Pain, Sore Throat, Visual Disturbances and Yellow Eyes. Respiratory Not Present- Bloody sputum, Chronic Cough, Difficulty Breathing, Snoring and Wheezing. Breast Not Present- Breast Mass, Breast Pain, Nipple Discharge and Skin Changes. Cardiovascular Not Present- Chest Pain, Difficulty Breathing Lying Down, Leg Cramps, Palpitations, Rapid Heart Rate, Shortness of Breath and Swelling of Extremities. Gastrointestinal Present- Chronic diarrhea and Hemorrhoids. Not Present- Abdominal Pain, Bloating, Bloody Stool, Change in Bowel Habits, Constipation, Difficulty Swallowing, Excessive gas, Gets full quickly at meals, Indigestion, Nausea, Rectal Pain and Vomiting. Musculoskeletal Present- Muscle Weakness. Not Present- Back Pain, Joint Pain, Joint Stiffness, Muscle Pain and Swelling of Extremities. Neurological Present- Decreased Memory, Numbness and Tingling. Not  Present- Fainting, Headaches, Seizures, Tremor, Trouble walking and Weakness. Psychiatric Not Present- Anxiety, Bipolar, Change in Sleep Pattern, Depression, Fearful and Frequent crying. Endocrine Not Present- Cold Intolerance, Excessive Hunger, Hair Changes, Heat Intolerance, Hot flashes and New Diabetes. Hematology Not Present- Easy Bruising, Excessive bleeding, Gland problems, HIV and Persistent Infections.  Vitals Meghan Lorenzo [name];  07/13/2014 10:33 AM) 07/13/2014 10:33 AM Weight: 134.13 lb Height: 63in Body Surface Area: 1.64 m Body Mass Index: 23.76 kg/m Temp.: 37F  Pulse: 64 (Regular)  BP: 120/68 (Sitting, Left Arm, Standard)     Physical Exam [name]; 0/05/4095 10:59 AM)  General Mental Status-Alert. General Appearance-Consistent with stated age. Hydration-Well hydrated. Voice-Normal.  Head and Neck Head-normocephalic, atraumatic with no lesions or palpable masses. Trachea-midline. Thyroid Gland Characteristics - normal size and consistency.  Eye Eyeball - Bilateral-Extraocular movements intact. Sclera/Conjunctiva - Bilateral-No scleral icterus.  Chest and Lung Exam Chest and lung exam reveals -quiet, even and easy respiratory effort with no use of accessory muscles and on auscultation, normal breath sounds, no adventitious sounds and normal vocal resonance. Inspection Chest Wall - Normal. Back - normal.  Cardiovascular Cardiovascular examination reveals -normal heart sounds, regular rate and rhythm with no murmurs and normal pedal pulses bilaterally.  Abdomen Inspection Inspection of the abdomen reveals - No Hernias. Palpation/Percussion Palpation and Percussion of the abdomen reveal - Soft, Non Tender, No Rebound tenderness, No Rigidity (guarding) and No hepatosplenomegaly. Auscultation Auscultation of the abdomen reveals - Bowel sounds normal.  Rectal Anorectal Exam External - normal sphincter . Internal -  sphincter atony. Note: Good resting tone. No squeeze noted. Good pulse pressure noted.  Neurologic Neurologic evaluation reveals -alert and oriented x 3 with no impairment of recent or remote memory. Mental Status-Normal.  Musculoskeletal Global Assessment -Note:no gross deformities.  Normal Exam - Left-Upper Extremity Strength Normal and Lower Extremity Strength Normal. Normal Exam - Right-Upper Extremity Strength Normal and Lower Extremity Strength Normal.    Assessment & Plan [name]; 06/08/3297 11:01 AM)  FECAL INCONTINENCE (787.60  R15.9) Story: Complete your bowel diary for the next week and drop it by the office next Fri. Impression: 69 year old female who has tried medical management of her fecal incontinence for over a year now. She has had some success but continues to have episodes of leakage approximately every 3 days. This is associated with loose bowel movements. She is unable to control this with Imodium. We discussed sacral nerve stimulator with her. She would like to try this. I have given her a bowel diary to complete over the next week to evaluate her symptoms at baseline. We will try to get her scheduled for stage I of the stimulator the following week. Risk of this procedure include bleeding infection and failure of the device to improve symptoms.

## 2014-08-03 NOTE — Anesthesia Preprocedure Evaluation (Signed)
Anesthesia Evaluation  Patient identified by MRN, date of birth, ID band Patient awake    Reviewed: Allergy & Precautions, NPO status , Patient's Chart, lab work & pertinent test results  Airway Mallampati: II   Neck ROM: full    Dental   Pulmonary neg pulmonary ROS,  breath sounds clear to auscultation        Cardiovascular hypertension, Rhythm:regular Rate:Normal     Neuro/Psych    GI/Hepatic hiatal hernia, GERD-  ,  Endo/Other  diabetes, Type 2  Renal/GU      Musculoskeletal  (+) Arthritis -,   Abdominal   Peds  Hematology   Anesthesia Other Findings   Reproductive/Obstetrics                             Anesthesia Physical Anesthesia Plan  ASA: II  Anesthesia Plan: MAC   Post-op Pain Management:    Induction: Intravenous  Airway Management Planned: Simple Face Mask  Additional Equipment:   Intra-op Plan:   Post-operative Plan:   Informed Consent: I have reviewed the patients History and Physical, chart, labs and discussed the procedure including the risks, benefits and alternatives for the proposed anesthesia with the patient or authorized representative who has indicated his/her understanding and acceptance.     Plan Discussed with: CRNA, Anesthesiologist and Surgeon  Anesthesia Plan Comments:         Anesthesia Quick Evaluation

## 2014-08-03 NOTE — Transfer of Care (Signed)
Last Vitals:  Filed Vitals:   08/03/14 1033  BP: 120/66  Pulse: 66  Temp: 36.4 C  Resp: 14   Immediate Anesthesia Transfer of Care Note  Patient: [name]  Procedure(s) Performed: Procedure(s) (LRB): INSERTION OF PERIPHERAL NEUROSTIMULATOR PULSE GENERATOR (N/A)  Patient Location: PACU  Anesthesia Type: MAC  Level of Consciousness: awake, alert  and oriented  Airway & Oxygen Therapy: Patient Spontanous Breathing and Patient connected to face mask oxygen  Post-op Assessment: Report given to PACU RN and Post -op Vital signs reviewed and stable  Post vital signs: Reviewed and stable  Complications: No apparent anesthesia complications

## 2014-08-03 NOTE — Discharge Instructions (Addendum)
GENERAL SURGERY: POST OP INSTRUCTIONS  1. DIET: Follow a light bland diet the first 24 hours after arrival home, such as soup, liquids, crackers, etc.  Be sure to include lots of fluids daily.  Avoid fast food or heavy meals as your are more likely to get nauseated.   2. Take your usually prescribed home medications unless otherwise directed. 3. PAIN CONTROL: a. Pain is best controlled by a usual combination of three different methods TOGETHER: i. Ice/Heat ii. Over the counter pain medication iii. Prescription pain medication b. Most patients will experience some swelling and bruising around the incisions.  Ice packs or heating pads (30-60 minutes up to 6 times a day) will help. Use ice for the first few days to help decrease swelling and bruising, then switch to heat to help relax tight/sore spots and speed recovery.  Some people prefer to use ice alone, heat alone, alternating between ice & heat.  Experiment to what works for you.  Swelling and bruising can take several weeks to resolve.   c. It is helpful to take an over-the-counter pain medication regularly for the first few weeks.  Choose one of the following that works best for you: i. Naproxen (Aleve, etc)  Two 220mg  tabs twice a day ii. Ibuprofen (Advil, etc) Three 200mg  tabs four times a day (every meal & bedtime) iii. Take your pain medication as prescribed if needed.  iv. If you are having problems/concerns with the prescription medicine (does not control pain, nausea, vomiting, rash, itching, etc), please call us [phone] to see if we need to switch you to a different pain medicine that will work better for you and/or control your side effect better. v. If you need a refill on your pain medication, please contact your pharmacy.  They will contact our office to request authorization. Prescriptions will not be filled after 5 pm or on week-ends. 4. Avoid getting constipated.  Between the surgery and the pain medications, it is common  to experience some constipation.  Increasing fluid intake and taking a fiber supplement (such as Metamucil, Citrucel, FiberCon, MiraLax, etc) 1-2 times a day regularly will usually help prevent this problem from occurring.  A mild laxative (prune juice, Milk of Magnesia, MiraLax, etc) should be taken according to package directions if there are no bowel movements after 48 hours.   5. Wash / shower every day.  You may shower over the dressings as they are waterproof.    6. ACTIVITIES as tolerated:   a. You may resume regular (light) daily activities beginning the next day--such as daily self-care, walking, climbing stairs--gradually increasing activities as tolerated.  If you can walk 30 minutes without difficulty, it is safe to try more intense activity such as jogging, treadmill, bicycling, low-impact aerobics, swimming, etc. b. Save the most intensive and strenuous activity for last such as sit-ups, heavy lifting, contact sports, etc  Refrain from any heavy lifting or straining until you are off narcotics for pain control.   c. DO NOT PUSH THROUGH PAIN.  Let pain be your guide: If it hurts to do something, don't do it.  Pain is your body warning you to avoid that activity for another week until the pain goes down. d. You may drive when you are no longer taking prescription pain medication, you can comfortably wear a seatbelt, and you can safely maneuver your car and apply brakes. e. Dennis Bast may have sexual intercourse when it is comfortable.  7. FOLLOW UP in our office a. Please  call [facility] at [phone] to set up an appointment to see your surgeon in the office for a follow-up appointment approximately 2-3 weeks after your surgery. b. Make sure that you call for this appointment the day you arrive home to insure a convenient appointment time. 9. IF YOU HAVE DISABILITY OR FAMILY LEAVE FORMS, BRING THEM TO THE OFFICE FOR PROCESSING.  DO NOT GIVE THEM TO YOUR DOCTOR.   WHEN TO CALL us [phone]  [phone]: 1. Poor pain control 2. Reactions / problems with new medications (rash/itching, nausea, etc)  3. Fever over 101.5 F (38.5 C) 4. Worsening swelling or bruising 5. Continued bleeding from incision. 6. Increased pain, redness, or drainage from the incision   The clinic staff is available to answer your questions during regular business hours (8:30am-5pm).  Please dont hesitate to call and ask to speak to one of our nurses for clinical concerns.   If you have a medical emergency, go to the nearest emergency room or call 911.  A surgeon from [facility] is always on call at the [facility], [place], [place], [place], [place]  [address] ? MAIN: [phone] ? TOLL FREE: [phone] ?  FAX [phone] [email or web address]   Post Anesthesia Home Care Instructions  Activity: Get plenty of rest for the remainder of the day. A responsible adult should stay with you for 24 hours following the procedure.  For the next 24 hours, DO NOT: -Drive a car -Paediatric nurse -Drink alcoholic beverages -Take any medication unless instructed by your physician -Make any legal decisions or sign important papers.  Meals: Start with liquid foods such as gelatin or soup. Progress to regular foods as tolerated. Avoid greasy, spicy, heavy foods. If nausea and/or vomiting occur, drink only clear liquids until the nausea and/or vomiting subsides. Call your physician if vomiting continues.  Special Instructions/Symptoms: Your throat may feel dry or sore from the anesthesia or the breathing tube placed in your throat during surgery. If this causes discomfort, gargle with warm salt water. The discomfort should disappear within 24 hours.  If you had a scopolamine patch placed behind your ear for the management of post- operative nausea and/or vomiting:  1. The medication in the patch is effective for 72 hours, after which it  should be removed.  Wrap patch in a tissue and discard in the trash. Wash hands thoroughly with soap and water. 2. You may remove the patch earlier than 72 hours if you experience unpleasant side effects which may include dry mouth, dizziness or visual disturbances. 3. Avoid touching the patch. Wash your hands with soap and water after contact with the patch.

## 2014-08-03 NOTE — Op Note (Signed)
08/03/2014  12:47 PM  PATIENT:  [name]  69 y.o. female  Patient Care Team: Marin Olp, MD as PCP - General (Family Medicine)  PRE-OPERATIVE DIAGNOSIS:  fecal incontience  POST-OPERATIVE DIAGNOSIS:  fecal incontience  PROCEDURE:  INSERTION OF SACRAL NEUROSTIMULATOR PULSE GENERATOR, STAGE 2   Surgeon(s): Leighton Ruff, MD  ASSISTANT: none   ANESTHESIA:   local and MAC  EBL:  Total I/O In: 300 [I.V.:300] Out: -   DRAINS: none   SPECIMEN:  No Specimen  DISPOSITION OF SPECIMEN:  N/A  COUNTS:  YES  PLAN OF CARE: Discharge to home after PACU  PATIENT DISPOSITION:  PACU - hemodynamically stable.  INDICATION: Fecal incontinence.  The patient has underwent the test phase for 8 days and has had 3 episodes since then.  This is a 70% reduction in symptoms.  The patient agrees to proceed with full implantation of the neuro stimulator device.   OR FINDINGS: Wire lead intact.  Impedence values < 1000.  DESCRIPTION: The patient was identified in the preoperative holding area and taken to the OR where they were laid prone on the operating room table.  MAC anesthesia was induced without difficulty. SCDs were also noted to be in place prior to the initiation of anesthesia.  The patient was then prepped and draped in the usual sterile fashion.   A surgical timeout was performed indicating the correct patient, procedure, positioning and need for preoperative antibiotics.  Local injection of Marcaine was administered.  The previous upper buttock incision was opened using blunt dissection and the lead/percutaneous connection was identified and evaluated. The percutaneous extension was cut on the braided wire and was removed from the field ensuring sterility. The subcutaneous pocket was enlarged to accommodate the internal neurostimulator (INS) and hemostasis was established. The sutures securing the protective boot were cut and the boot retracted to expose the four set  screws. Using the torque wrench, the screws were loosened and the percutaneous extension was disconnected from the lead. The boot and connection were removed and discarded.  The lead was cleaned and dried. The lead was inserted into the header of the InterStim neurostimulator until the blue tip was visualized at the distal window. The single set screw was tightened using the torque wrench until audible click was heard. The neurostimulator was placed into the pocket with the etched identification side placed upwards and any excessive lead was placed around the neurostimulator. The clinician programmer telemetry head, covered in a sterile sleeve, was placed over the implanted neurostimulator to ensure proper lead connection and that parameters were within normal range. Impedances were confirmed to be within normal limits, greater than 50 and less than 4,000 ohms. The wound was irrigated with sterile water and closed with a running 3-0 Vicryl subcuticular suture and a 4-0 Vicryl running skin suture. Counts were correct. Dermabond was placed over the incision. The patient was transferred to the PACU in satisfactory condition. Using the clinician programmer, the INS was programmed.   Patient was provided utilization instructions for the patient programmer prior to discharge.

## 2014-08-04 NOTE — Anesthesia Postprocedure Evaluation (Signed)
Anesthesia Post Note  Patient: [name]  Procedure(s) Performed: Procedure(s) (LRB): INSERTION OF PERIPHERAL NEUROSTIMULATOR PULSE GENERATOR (N/A)  Anesthesia type: MAC  Patient location: PACU  Post pain: Pain level controlled and Adequate analgesia  Post assessment: Post-op Vital signs reviewed, Patient's Cardiovascular Status Stable and Respiratory Function Stable  Last Vitals:  Filed Vitals:   08/03/14 1430  BP: 118/52  Pulse: 63  Temp: 36.4 C  Resp: 17    Post vital signs: Reviewed and stable  Level of consciousness: awake, alert  and oriented  Complications: No apparent anesthesia complications

## 2014-09-26 ENCOUNTER — Other Ambulatory Visit: Payer: Self-pay | Admitting: Family Medicine

## 2014-09-26 ENCOUNTER — Other Ambulatory Visit: Payer: Self-pay | Admitting: Internal Medicine

## 2014-12-18 ENCOUNTER — Encounter: Payer: Self-pay | Admitting: Gastroenterology

## 2015-01-02 DIAGNOSIS — H4011X1 Primary open-angle glaucoma, mild stage: Secondary | ICD-10-CM | POA: Diagnosis not present

## 2015-01-02 DIAGNOSIS — E119 Type 2 diabetes mellitus without complications: Secondary | ICD-10-CM | POA: Diagnosis not present

## 2015-01-02 DIAGNOSIS — Z961 Presence of intraocular lens: Secondary | ICD-10-CM | POA: Diagnosis not present

## 2015-01-02 LAB — HM DIABETES EYE EXAM

## 2015-01-08 ENCOUNTER — Ambulatory Visit (INDEPENDENT_AMBULATORY_CARE_PROVIDER_SITE_OTHER): Payer: Medicare Other | Admitting: Family Medicine

## 2015-01-08 ENCOUNTER — Ambulatory Visit: Payer: Medicare Other | Admitting: Family Medicine

## 2015-01-08 DIAGNOSIS — Z23 Encounter for immunization: Secondary | ICD-10-CM | POA: Diagnosis not present

## 2015-01-30 ENCOUNTER — Encounter: Payer: Self-pay | Admitting: Gastroenterology

## 2015-03-26 ENCOUNTER — Ambulatory Visit (AMBULATORY_SURGERY_CENTER): Payer: Self-pay | Admitting: *Deleted

## 2015-03-26 VITALS — Ht 62.0 in | Wt 124.4 lb

## 2015-03-26 DIAGNOSIS — Z86010 Personal history of colonic polyps: Secondary | ICD-10-CM

## 2015-03-26 DIAGNOSIS — Z800 Family history of malignant neoplasm of digestive organs: Secondary | ICD-10-CM

## 2015-03-26 MED ORDER — NA SULFATE-K SULFATE-MG SULF 17.5-3.13-1.6 GM/177ML PO SOLN: 1.0000 | Freq: Once | ORAL | Status: DC

## 2015-03-26 NOTE — Progress Notes (Signed)
No egg or soy allergy known to patient  No issues with past sedation with any surgeries  or procedures, no intubation problems  -pt states she doesn't take much for sedation  No diet pills No home 02 use per patient  Pt avoids milk and cheese products  Pt has a medtronic sphincter stimulator that has to be turned off prior to her colon.  Her husband knows how to turn this off and on with a remote. Pt and husband concerned if she needs a colon with this. Spoke with Dr Havery Moros and he states with her Family hx and her personal hx of polyps he recommends her colon to proceed but if they prefer she can have an OV with him to discuss .  Both pt and husband agreed  To proceed with colon and instructed if she changes her mind, call office to cancel colon and make OV. Both understand

## 2015-04-03 ENCOUNTER — Encounter: Payer: Self-pay | Admitting: *Deleted

## 2015-04-09 ENCOUNTER — Other Ambulatory Visit: Payer: Self-pay | Admitting: Family Medicine

## 2015-04-15 ENCOUNTER — Encounter: Payer: Medicare Other | Admitting: Gastroenterology

## 2015-04-18 ENCOUNTER — Encounter: Payer: Self-pay | Admitting: Family Medicine

## 2015-04-18 ENCOUNTER — Ambulatory Visit (INDEPENDENT_AMBULATORY_CARE_PROVIDER_SITE_OTHER): Payer: Medicare Other | Admitting: Family Medicine

## 2015-04-18 VITALS — BP 118/72 | HR 84 | Temp 98.2°F | Wt 122.0 lb

## 2015-04-18 DIAGNOSIS — E785 Hyperlipidemia, unspecified: Secondary | ICD-10-CM

## 2015-04-18 DIAGNOSIS — I10 Essential (primary) hypertension: Secondary | ICD-10-CM

## 2015-04-18 DIAGNOSIS — Z20828 Contact with and (suspected) exposure to other viral communicable diseases: Secondary | ICD-10-CM | POA: Diagnosis not present

## 2015-04-18 DIAGNOSIS — E119 Type 2 diabetes mellitus without complications: Secondary | ICD-10-CM

## 2015-04-18 MED ORDER — ATORVASTATIN CALCIUM 10 MG PO TABS: ORAL_TABLET | ORAL | Status: DC

## 2015-04-18 MED ORDER — ONETOUCH DELICA LANCETS 33G MISC: Status: DC

## 2015-04-18 MED ORDER — VALSARTAN-HYDROCHLOROTHIAZIDE 80-12.5 MG PO TABS: 0.5000 | ORAL_TABLET | Freq: Every day | ORAL | Status: DC

## 2015-04-18 MED ORDER — GLIMEPIRIDE 1 MG PO TABS: ORAL_TABLET | ORAL | Status: DC

## 2015-04-18 MED ORDER — GLUCOSE BLOOD VI STRP: ORAL_STRIP | Status: DC

## 2015-04-18 NOTE — Progress Notes (Signed)
Meghan Reddish, MD  Subjective:  [name] is a 70 y.o. year old very pleasant female patient who presents for/with See problem oriented charting ROS- No chest pain or shortness of breath. No headache or blurry vision. No hypoglycemia.  Past Medical History-  Patient Active Problem List   Diagnosis Date Noted  . Fecal incontinence 03/23/2014    Priority: High  . Diabetes mellitus type II, controlled ([place]) 10/19/2006    Priority: High  . H/O partial thyroidectomy 03/23/2014    Priority: Medium  . Hyperlipidemia 10/19/2006    Priority: Medium  . Essential hypertension 10/19/2006    Priority: Medium  . Glaucoma 03/23/2014    Priority: Low  . Family history of malignant neoplasm of gastrointestinal tract 08/26/2010    Priority: Low  . Anemia 08/26/2010    Priority: Low  . Esophageal stricture 07/16/2010    Priority: Low  . Osteopenia 04/22/2007    Priority: Low    Medications- reviewed and updated Current Outpatient Prescriptions  Medication Sig Dispense Refill  . aspirin 81 MG tablet Take 81 mg by mouth daily.      Marland Kitchen atorvastatin (LIPITOR) 10 MG tablet TAKE 1 TABLET (10 MG TOTAL) BY MOUTH DAILY. 90 tablet 2  . brimonidine (ALPHAGAN P) 0.1 % SOLN Place 1 drop into both eyes 3 (three) times daily.     . Calcium Carbonate-Vit D-Min (GNP CALCIUM 1200) 1200-1000 MG-UNIT CHEW Chew 1 tablet by mouth daily.      Marland Kitchen glimepiride (AMARYL) 1 MG tablet TAKE 1 TABLET (1 MG TOTAL) BY MOUTH DAILY BEFORE BREAKFAST. 90 tablet 2  . glucose blood (ONETOUCH VERIO) test strip Use to test blood sugars daily. Dx: E11.9 100 each 11  . latanoprost (XALATAN) 0.005 % ophthalmic solution Place 1 drop into both eyes at bedtime.     . Multiple Vitamins-Iron (MULTIVITAMIN/IRON) TABS Take 1 tablet by mouth daily.      [name] DELICA LANCETS 99991111 MISC Use to check blood sugars daily. Dx: E11.9 100 each 11  . Probiotic Product (ALIGN) 4 MG CAPS Take 1 capsule by mouth daily.      .  valsartan-hydrochlorothiazide (DIOVAN-HCT) 80-12.5 MG tablet Take 0.5 tablets by mouth daily. 90 tablet 2   No current facility-administered medications for this visit.    Objective: BP 118/72 mmHg  Pulse 84  Temp(Src) 98.2 F (36.8 C)  Wt 122 lb (55.339 kg) Gen: NAD, resting comfortably CV: RRR no murmurs rubs or gallops Lungs: CTAB no crackles, wheeze, rhonchi Abdomen: soft/nontender/nondistended/normal bowel sounds.  Ext: no edema Skin: warm, dry, no rash Neuro: grossly normal, moves all extremities  Diabetic Foot Exam - Simple   Simple Foot Form  Diabetic Foot exam was performed with the following findings:  Yes 04/18/2015  9:16 AM  Visual Inspection  No deformities, no ulcerations, no other skin breakdown bilaterally:  Yes  Sensation Testing  Intact to touch and monofilament testing bilaterally:  Yes  Pulse Check  Posterior Tibialis and Dorsalis pulse intact bilaterally:  Yes  Comments    did note very slight erythema between toes 1 and 2 on right- advised looser fitting shoes and consider padding between toes.   Assessment/Plan:  Diabetes mellitus type II, controlled ([place]) S: well controlled. On amaryl1mg  Fasting-90 day average 105 with range 88-128 Lab Results  Component Value Date   HGBA1C 6.9* 06/18/2014   HGBA1C 7.0* 03/23/2014   HGBA1C 9.2* 12/01/2012   A/P: check a1c today, hopeful remains below 7. Continue current rx for  now   Essential hypertension S: controlled. On  Valsartan-hctz 80-12.5 BP Readings from Last 3 Encounters:  04/18/15 118/72  08/03/14 118/52  07/26/14 131/48  A/P:Continue current medications   Hyperlipidemia S: well controlled on atorvastatin 10mg . No myalgias. Did experience some memory issues and sleeping issues that resolved when stopped Lab Results  Component Value Date   CHOL 175 12/01/2012   HDL 44.10 12/01/2012   LDLCALC 104* 12/01/2012   LDLDIRECT 44.4 03/23/2014   TRIG 133.0 12/01/2012   CHOLHDL 4 12/01/2012   A/P:  trial every other day atorvastatin. If continued SE- would trial once a week (could consider going up to 40mg  if pulse therapy)    Return in about 6 months (around 10/16/2015) for follow up- or sooner if needed. could be AWV Return precautions advised.  Needs TSH next labs  Orders Placed This Encounter  Procedures  . Comprehensive metabolic panel    [place]  . Hemoglobin A1c    [place]  . Hepatitis C antibody, reflex    [facility]   Meds ordered this encounter  Medications  . valsartan-hydrochlorothiazide (DIOVAN-HCT) 80-12.5 MG tablet    Sig: Take 0.5 tablets by mouth daily.    Dispense:  90 tablet    Refill:  2  . ONETOUCH DELICA LANCETS 99991111 MISC    Sig: Use to check blood sugars daily. Dx: E11.9    Dispense:  100 each    Refill:  11  . glucose blood (ONETOUCH VERIO) test strip    Sig: Use to test blood sugars daily. Dx: E11.9    Dispense:  100 each    Refill:  11  . glimepiride (AMARYL) 1 MG tablet    Sig: TAKE 1 TABLET (1 MG TOTAL) BY MOUTH DAILY BEFORE BREAKFAST.    Dispense:  90 tablet    Refill:  2  . atorvastatin (LIPITOR) 10 MG tablet    Sig: TAKE 1 TABLET (10 MG TOTAL) BY MOUTH DAILY.    Dispense:  90 tablet    Refill:  2

## 2015-04-18 NOTE — Patient Instructions (Signed)
Labs before you go  No changes to your medicine except trial the atorvastatin every other day to see if that helps with the sleep and memory issues- if you have recurrent issues- call me back and update me

## 2015-04-18 NOTE — Assessment & Plan Note (Signed)
S: well controlled on atorvastatin 10mg . No myalgias. Did experience some memory issues and sleeping issues that resolved when stopped Lab Results  Component Value Date   CHOL 175 12/01/2012   HDL 44.10 12/01/2012   LDLCALC 104* 12/01/2012   LDLDIRECT 44.4 03/23/2014   TRIG 133.0 12/01/2012   CHOLHDL 4 12/01/2012   A/P: trial every other day atorvastatin. If continued SE- would trial once a week (could consider going up to 40mg  if pulse therapy)

## 2015-04-18 NOTE — Assessment & Plan Note (Signed)
S: well controlled. On amaryl1mg  Fasting-90 day average 105 with range 88-128 Lab Results  Component Value Date   HGBA1C 6.9* 06/18/2014   HGBA1C 7.0* 03/23/2014   HGBA1C 9.2* 12/01/2012   A/P: check a1c today, hopeful remains below 7. Continue current rx for now

## 2015-04-18 NOTE — Assessment & Plan Note (Signed)
S: controlled. On  Valsartan-hctz 80-12.5 BP Readings from Last 3 Encounters:  04/18/15 118/72  08/03/14 118/52  07/26/14 131/48  A/P:Continue current medications

## 2015-05-03 ENCOUNTER — Ambulatory Visit (AMBULATORY_SURGERY_CENTER): Payer: Medicare Other | Admitting: Gastroenterology

## 2015-05-03 ENCOUNTER — Encounter: Payer: Self-pay | Admitting: Gastroenterology

## 2015-05-03 VITALS — BP 115/52 | HR 67 | Temp 95.5°F | Resp 12 | Ht 62.0 in | Wt 124.4 lb

## 2015-05-03 DIAGNOSIS — D122 Benign neoplasm of ascending colon: Secondary | ICD-10-CM

## 2015-05-03 DIAGNOSIS — D120 Benign neoplasm of cecum: Secondary | ICD-10-CM

## 2015-05-03 DIAGNOSIS — Z860100 Personal history of colon polyps, unspecified: Secondary | ICD-10-CM

## 2015-05-03 DIAGNOSIS — I10 Essential (primary) hypertension: Secondary | ICD-10-CM | POA: Diagnosis not present

## 2015-05-03 DIAGNOSIS — Z800 Family history of malignant neoplasm of digestive organs: Secondary | ICD-10-CM | POA: Diagnosis not present

## 2015-05-03 DIAGNOSIS — E119 Type 2 diabetes mellitus without complications: Secondary | ICD-10-CM | POA: Diagnosis not present

## 2015-05-03 DIAGNOSIS — E069 Thyroiditis, unspecified: Secondary | ICD-10-CM | POA: Diagnosis not present

## 2015-05-03 LAB — GLUCOSE, CAPILLARY
GLUCOSE-CAPILLARY: 90 mg/dL (ref 65–99)
Glucose-Capillary: 90 mg/dL (ref 65–99)

## 2015-05-03 MED ORDER — SODIUM CHLORIDE 0.9 % IV SOLN: 500.0000 mL | INTRAVENOUS | Status: DC

## 2015-05-03 NOTE — Progress Notes (Signed)
Called to room to assist during endoscopic procedure.  Patient ID and intended procedure confirmed with present staff. Received instructions for my participation in the procedure from the performing physician.  

## 2015-05-03 NOTE — Patient Instructions (Signed)
YOU HAD AN ENDOSCOPIC PROCEDURE TODAY AT [facility]:   Refer to the procedure report that was given to you for any specific questions about what was found during the examination.  If the procedure report does not answer your questions, please call your gastroenterologist to clarify.  If you requested that your care partner not be given the details of your procedure findings, then the procedure report has been included in a sealed envelope for you to review at your convenience later.  YOU SHOULD EXPECT: Some feelings of bloating in the abdomen. Passage of more gas than usual.  Walking can help get rid of the air that was put into your GI tract during the procedure and reduce the bloating. If you had a lower endoscopy (such as a colonoscopy or flexible sigmoidoscopy) you may notice spotting of blood in your stool or on the toilet paper. If you underwent a bowel prep for your procedure, you may not have a normal bowel movement for a few days.  Please Note:  You might notice some irritation and congestion in your nose or some drainage.  This is from the oxygen used during your procedure.  There is no need for concern and it should clear up in a day or so.  SYMPTOMS TO REPORT IMMEDIATELY:   Following lower endoscopy (colonoscopy or flexible sigmoidoscopy):  Excessive amounts of blood in the stool  Significant tenderness or worsening of abdominal pains  Swelling of the abdomen that is new, acute  Fever of 100F or higher    For urgent or emergent issues, a gastroenterologist can be reached at any hour by calling [phone].   DIET: Your first meal following the procedure should be a small meal and then it is ok to progress to your normal diet. Heavy or fried foods are harder to digest and may make you feel nauseous or bloated.  Likewise, meals heavy in dairy and vegetables can increase bloating.  Drink plenty of fluids but you should avoid alcoholic beverages for 24  hours.  ACTIVITY:  You should plan to take it easy for the rest of today and you should NOT DRIVE or use heavy machinery until tomorrow (because of the sedation medicines used during the test).    FOLLOW UP: Our staff will call the number listed on your records the next business day following your procedure to check on you and address any questions or concerns that you may have regarding the information given to you following your procedure. If we do not reach you, we will leave a message.  However, if you are feeling well and you are not experiencing any problems, there is no need to return our call.  We will assume that you have returned to your regular daily activities without incident.  Polyp, diverticulosis, and hemorrhoid information given.  If any biopsies were taken you will be contacted by phone or by letter within the next 1-3 weeks.  Please call us at [phone] if you have not heard about the biopsies in 3 weeks.    SIGNATURES/CONFIDENTIALITY: You and/or your care partner have signed paperwork which will be entered into your electronic medical record.  These signatures attest to the fact that that the information above on your After Visit Summary has been reviewed and is understood.  Full responsibility of the confidentiality of this discharge information lies with you and/or your care-partner.

## 2015-05-03 NOTE — Progress Notes (Signed)
To recovery, report to Scott, RN, VSS 

## 2015-05-03 NOTE — Op Note (Signed)
[facility]  [address]. [place], [address]   COLONOSCOPY PROCEDURE REPORT  PATIENT: [name], [name]  MR#: [id number] BIRTHDATE: 1946/01/06 , 16  yrs. old GENDER: female ENDOSCOPIST: Yetta Flock, MD REFERRED BY: [name] MD PROCEDURE DATE:  05/03/2015 PROCEDURE:   Colonoscopy, surveillance and Colonoscopy with biopsy First Screening Colonoscopy - Avg.  risk and is 50 yrs.  old or older - No.  Prior Negative Screening - Now for repeat screening. N/A  History of Adenoma - Now for follow-up colonoscopy & has been > or = to 3 yrs.  Yes hx of adenoma.  Has been 3 or more years since last colonoscopy.  Polyps removed today? Yes ASA CLASS:   Class III INDICATIONS:Surveillance due to prior colonic neoplasia and FH Colon or Rectal Adenocarcinoma (father age late 48s). MEDICATIONS: Propofol 250 mg IV  DESCRIPTION OF PROCEDURE:   After the risks benefits and alternatives of the procedure were thoroughly explained, informed consent was obtained.  The digital rectal exam revealed no abnormalities of the rectum.   The [id number]  endoscope was introduced through the anus and advanced to the cecum, which was identified by both the appendix and ileocecal valve. No adverse events experienced.   The quality of the prep was adequate  The instrument was then slowly withdrawn as the colon was fully examined. Estimated blood loss is zero unless otherwise noted in this procedure report.   COLON FINDINGS: A diminutive sessile polyp was noted in the cecum and removed with cold forceps.  A 52mm sessile polyp was noted in the ascending colon and removed with cold forceps.  Mild diverticulosis was noted in the right colon.  The remainder of the examined colon was normal.  Of note, I thought a photograph of the IC valve was taken but it did not capture.  Retroflexed views revealed internal hemorrhoids. The time to cecum = 3.3 Withdrawal time = 17.1   The  scope was withdrawn and the procedure completed. COMPLICATIONS: There were no immediate complications.  ENDOSCOPIC IMPRESSION: 2 small colon polyps removed with cold forceps Mild right sided diverticulosis Internal hemorrhoids  RECOMMENDATIONS: Await pathology results although a repeat colonoscopy will be recommended to be done in 5 years given FH of CRC Resume diet Resume medications  eSigned:  Yetta Flock, MD 05/03/2015 2:00 PM   cc:  [name] MD, the patient

## 2015-05-06 ENCOUNTER — Telehealth: Payer: Self-pay

## 2015-05-06 NOTE — Telephone Encounter (Signed)
  Follow up Call-  Call back number 05/03/2015  Post procedure Call Back phone  # [phone]  Permission to leave phone message Yes     Patient questions:  Do you have a fever, pain , or abdominal swelling? No. Pain Score  0 *  Have you tolerated food without any problems? Yes.    Have you been able to return to your normal activities? Yes.    Do you have any questions about your discharge instructions: Diet   No. Medications  No. Follow up visit  No.  Do you have questions or concerns about your Care? No.  Actions: * If pain score is 4 or above: No action needed, pain <4.  No problems per the pt. maw

## 2015-05-08 ENCOUNTER — Encounter: Payer: Self-pay | Admitting: Gastroenterology

## 2015-05-20 ENCOUNTER — Encounter: Payer: Self-pay | Admitting: Family Medicine

## 2015-05-20 ENCOUNTER — Ambulatory Visit (INDEPENDENT_AMBULATORY_CARE_PROVIDER_SITE_OTHER): Payer: Medicare Other | Admitting: Family Medicine

## 2015-05-20 VITALS — BP 121/69 | HR 76 | Temp 97.6°F | Resp 20 | Wt 118.5 lb

## 2015-05-20 DIAGNOSIS — R197 Diarrhea, unspecified: Secondary | ICD-10-CM | POA: Insufficient documentation

## 2015-05-20 DIAGNOSIS — J069 Acute upper respiratory infection, unspecified: Secondary | ICD-10-CM | POA: Diagnosis not present

## 2015-05-20 NOTE — Progress Notes (Signed)
Patient ID: [name], female   DOB: 09/29/1945, 70 y.o.   MRN: [id number]    [name] , November 20, 1945, 70 y.o., female MRN: [id number]  CC: Diarrhea Subjective: Pt presents for an acute OV with complaints of diarrhea of 4 days duration. Associated symptoms include chills and fatigued. Patient states she stayed in bed all weekend. Pt states she has diarrhea frequently,she has a history of fecal incontinence that she states she is usually able to control with lomitol. No nausea or vomit, or abd pain. Last diarrhea yesterday afternoon. Tolerating PO. In addition also complains of mild runny nose.   Allergies  Allergen Reactions  . Lisinopril Diarrhea and Other (See Comments)    REACTION: hallucinations/diarrhea  . Metformin And Related Diarrhea   Social History  Substance Use Topics  . Smoking status: Never Smoker   . Smokeless tobacco: Never Used  . Alcohol Use: No   Past Medical History  Diagnosis Date  . Hyperlipidemia   . Hypertension   . Osteopenia   . Status post dilation of esophageal narrowing   . GERD (gastroesophageal reflux disease)   . Type 2 diabetes mellitus ([place])   . History of adenomatous polyp of colon   . Sigmoid diverticulosis   . History of diverticulitis     duodenal 2012-- resolved  . History of hiatal hernia   . Fecal incontinence     sphincter stim placed 07-2014  . Glaucoma, both eyes   . Arthritis     right arm  . Wears glasses   . Environmental allergies   . Allergy   . Cataract     bilateral and removal  . Thyroid disease    Past Surgical History  Procedure Laterality Date  . Thyroidectomy  1979    partial  . Bunionectomy Bilateral 1985  . Trigger finger release  1999  . Carpal tunnel release Left 08-05-2006  . Retinal detachment surgery  2008  . Knee arthroscopy Right 2013  . Rectal ultrasound N/A 03/20/2013    nerve damage  . Anal rectal manometry N/A 03/20/2013  . Tubal ligation  1976  .  Esophagogastroduodenoscopy (egd) with esophageal dilation  last one 09-11-2010  . Colonoscopy w/ polypectomy  last one 02-13-2010  . Cardiovascular stress test  02-13-2005    normal perfusion study/  no ischemia/  normal LV function and wall motion , ef 86%  . Vaginal hysterectomy  1983  . Laparoscopic nissen fundoplication  [id number]  . Tonsillectomy and adenoidectomy  as child  . Peripheral neuro stimulator  07-2014    sphincter stimulator  . Colonoscopy    . Polypectomy    . Cataract extraction     Family History  Problem Relation Age of Onset  . Diabetes Mother   . Heart disease Mother     CABG 1 in 26  . Heart attack Mother   . Stroke Mother   . Cancer Father 27    liver  . Colon cancer Father   . Colon cancer Maternal Grandmother   . Pancreatic cancer Maternal Grandmother   . Stomach cancer Maternal Grandmother   . Colon polyps Neg Hx   . Esophageal cancer Neg Hx   . Rectal cancer Neg Hx      Medication List       This list is accurate as of: 05/20/15  2:15 PM.  Always use your most recent med list.               ALIGN  4 MG Caps  Take 1 capsule by mouth daily.     aspirin 81 MG tablet  Take 81 mg by mouth daily.     atorvastatin 10 MG tablet  Commonly known as:  LIPITOR  TAKE 1 TABLET (10 MG TOTAL) BY MOUTH DAILY.     brimonidine 0.2 % ophthalmic solution  Commonly known as:  ALPHAGAN  PLACE ONE DROP IN EACH EYE THREE TIMES DAILY     glimepiride 1 MG tablet  Commonly known as:  AMARYL  TAKE 1 TABLET (1 MG TOTAL) BY MOUTH DAILY BEFORE BREAKFAST.     glucose blood test strip  Commonly known as:  ONETOUCH VERIO  Use to test blood sugars daily. Dx: E11.9     GNP CALCIUM 1200 1200-1000 MG-UNIT Chew  Chew 1 tablet by mouth daily.     latanoprost 0.005 % ophthalmic solution  Commonly known as:  XALATAN  Place 1 drop into both eyes at bedtime.     Multivitamin/Iron Tabs  Take 1 tablet by mouth daily.     ONETOUCH DELICA LANCETS 99991111 Misc  Use to check  blood sugars daily. Dx: E11.9     valsartan-hydrochlorothiazide 80-12.5 MG tablet  Commonly known as:  DIOVAN-HCT  Take 0.5 tablets by mouth daily.         ROS: Negative, with the exception of above mentioned in HPI   Objective:  BP 121/69 mmHg  Pulse 76  Temp(Src) 97.6 F (36.4 C)  Resp 20  Wt 118 lb 8 oz (53.751 kg)  SpO2 96% Body mass index is 21.67 kg/(m^2). Gen: Afebrile. No acute distress. Nontoxic in appearance. Well developed.wellnourished. Pleasant female.  HENT: AT. [place]. Bilateral TM visualized full, no erythema or bulging.  MMM, no oral lesions. Bilateral nares without erythema or swelling.. Throat without erythema or exudates. No cough or hoarseness on exam.  Eyes:Pupils Equal Round Reactive to light, Extraocular movements intact,  Conjunctiva without redness, discharge or icterus. Neck/lymp/endocrine: Supple,No  Lymphadenopathy CV: RRR  Chest: CTAB, no wheeze or crackles. Good air movement, normal resp effort.  Abd: Soft. Flat, NTND. BS present. No Masses palpated. No rebound or guarding.  Skin: No rashes, purpura or petechiae.  Neuro: Normal gait. PERLA. EOMi. Alert. Oriented x3  Assessment/Plan: [name] is a 70 y.o. female present for acute OV for 1. Diarrhea, unspecified type - Possibly viral related. Pt seems to be well hydrated today and no alarm signs on exam. No continued diarrhea at this time and she seems to be recovering well.  - maintain hydration and rest. Lomotil as needed.   2. Acute upper respiratory infection - plain mucinex./flonase - F/U PRN   Howard Pouch, DO  [place]- OR

## 2015-05-20 NOTE — Patient Instructions (Signed)
Pick up plain mucinex for your runny nose. It does not appear to be infected today. Make certain to stay well hydrated. I believe you likely had a touch of the GI bug.

## 2015-05-29 ENCOUNTER — Other Ambulatory Visit: Payer: Self-pay

## 2015-05-29 DIAGNOSIS — Z1231 Encounter for screening mammogram for malignant neoplasm of breast: Secondary | ICD-10-CM

## 2015-07-10 ENCOUNTER — Ambulatory Visit
Admission: RE | Admit: 2015-07-10 | Discharge: 2015-07-10 | Disposition: A | Discharge status: Home / Self Care | Payer: Medicare Other | Source: Ambulatory Visit

## 2015-07-10 DIAGNOSIS — Z1231 Encounter for screening mammogram for malignant neoplasm of breast: Secondary | ICD-10-CM | POA: Diagnosis not present

## 2015-07-18 DIAGNOSIS — J302 Other seasonal allergic rhinitis: Secondary | ICD-10-CM | POA: Diagnosis not present

## 2015-07-18 DIAGNOSIS — H6123 Impacted cerumen, bilateral: Secondary | ICD-10-CM | POA: Diagnosis not present

## 2015-07-18 DIAGNOSIS — J0100 Acute maxillary sinusitis, unspecified: Secondary | ICD-10-CM | POA: Diagnosis not present

## 2015-08-09 ENCOUNTER — Encounter: Payer: Self-pay | Admitting: Family Medicine

## 2015-08-09 ENCOUNTER — Ambulatory Visit (INDEPENDENT_AMBULATORY_CARE_PROVIDER_SITE_OTHER): Payer: Medicare Other | Admitting: Family Medicine

## 2015-08-09 ENCOUNTER — Other Ambulatory Visit: Payer: Self-pay | Admitting: Family Medicine

## 2015-08-09 VITALS — BP 112/70 | HR 69 | Temp 97.6°F | Ht 63.0 in | Wt 120.0 lb

## 2015-08-09 DIAGNOSIS — E890 Postprocedural hypothyroidism: Secondary | ICD-10-CM | POA: Diagnosis not present

## 2015-08-09 DIAGNOSIS — E119 Type 2 diabetes mellitus without complications: Secondary | ICD-10-CM

## 2015-08-09 DIAGNOSIS — Z20828 Contact with and (suspected) exposure to other viral communicable diseases: Secondary | ICD-10-CM

## 2015-08-09 DIAGNOSIS — I10 Essential (primary) hypertension: Secondary | ICD-10-CM

## 2015-08-09 DIAGNOSIS — H6123 Impacted cerumen, bilateral: Secondary | ICD-10-CM

## 2015-08-09 DIAGNOSIS — Z114 Encounter for screening for human immunodeficiency virus [HIV]: Secondary | ICD-10-CM | POA: Diagnosis not present

## 2015-08-09 DIAGNOSIS — R413 Other amnesia: Secondary | ICD-10-CM

## 2015-08-09 DIAGNOSIS — F0390 Unspecified dementia without behavioral disturbance: Secondary | ICD-10-CM | POA: Insufficient documentation

## 2015-08-09 DIAGNOSIS — Z7289 Other problems related to lifestyle: Secondary | ICD-10-CM | POA: Diagnosis not present

## 2015-08-09 DIAGNOSIS — E785 Hyperlipidemia, unspecified: Secondary | ICD-10-CM

## 2015-08-09 DIAGNOSIS — Z23 Encounter for immunization: Secondary | ICD-10-CM | POA: Diagnosis not present

## 2015-08-09 DIAGNOSIS — Z0000 Encounter for general adult medical examination without abnormal findings: Secondary | ICD-10-CM

## 2015-08-09 DIAGNOSIS — F03A0 Unspecified dementia, mild, without behavioral disturbance, psychotic disturbance, mood disturbance, and anxiety: Secondary | ICD-10-CM | POA: Insufficient documentation

## 2015-08-09 DIAGNOSIS — Z86010 Personal history of colonic polyps: Secondary | ICD-10-CM

## 2015-08-09 DIAGNOSIS — Z1159 Encounter for screening for other viral diseases: Secondary | ICD-10-CM | POA: Diagnosis not present

## 2015-08-09 DIAGNOSIS — Z860101 Personal history of adenomatous and serrated colon polyps: Secondary | ICD-10-CM | POA: Insufficient documentation

## 2015-08-09 DIAGNOSIS — Z9009 Acquired absence of other part of head and neck: Secondary | ICD-10-CM

## 2015-08-09 LAB — CBC
HCT: 36.8 % (ref 36.0–46.0)
Hemoglobin: 12.3 g/dL (ref 12.0–15.0)
MCHC: 33.6 g/dL (ref 30.0–36.0)
MCV: 88.7 fl (ref 78.0–100.0)
Platelets: 317 K/uL (ref 150.0–400.0)
RBC: 4.14 Mil/uL (ref 3.87–5.11)
RDW: 12.9 % (ref 11.5–15.5)
WBC: 6.8 K/uL (ref 4.0–10.5)

## 2015-08-09 LAB — LIPID PANEL
CHOL/HDL RATIO: 2
Cholesterol: 140 mg/dL (ref 0–200)
HDL: 62.6 mg/dL (ref >39.00)
LDL CALC: 49 mg/dL (ref 0–99)
NONHDL: 76.94
Triglycerides: 141 mg/dL (ref 0.0–149.0)
VLDL: 28.2 mg/dL (ref 0.0–40.0)

## 2015-08-09 LAB — VITAMIN B12: Vitamin B-12: 438 pg/mL (ref 211–911)

## 2015-08-09 LAB — COMPREHENSIVE METABOLIC PANEL
ALBUMIN: 4.4 g/dL (ref 3.5–5.2)
ALT: 20 U/L (ref 0–35)
AST: 19 U/L (ref 0–37)
Alkaline Phosphatase: 51 U/L (ref 39–117)
BUN: 17 mg/dL (ref 6–23)
CHLORIDE: 101 meq/L (ref 96–112)
CO2: 31 mEq/L (ref 19–32)
CREATININE: 0.77 mg/dL (ref 0.40–1.20)
Calcium: 9.6 mg/dL (ref 8.4–10.5)
GFR: 78.61 mL/min (ref >60.00)
GLUCOSE: 105 mg/dL — AB (ref 70–99)
POTASSIUM: 3.4 meq/L — AB (ref 3.5–5.1)
SODIUM: 140 meq/L (ref 135–145)
Total Bilirubin: 0.6 mg/dL (ref 0.2–1.2)
Total Protein: 6.7 g/dL (ref 6.0–8.3)

## 2015-08-09 LAB — TSH: TSH: 2.08 u[IU]/mL (ref 0.35–4.50)

## 2015-08-09 LAB — HEMOGLOBIN A1C: HEMOGLOBIN A1C: 6.4 % (ref 4.6–6.5)

## 2015-08-09 NOTE — Progress Notes (Signed)
Phone: [phone]  Subjective:  Patient presents today for their annual wellness visit.    Preventive Screening-Counseling & Management  Smoking Status: Never Smoker Second Hand Smoking status: No smokers in home  Risk Factors Regular exercise: walks some each week and is very active.  Diet: weight stable  Fall Risk: None   Cardiac risk factors:  advanced age (older than 49 for men, 18 for women)  Hyperlipidemia - well controlled on medicine Lab Results  Component Value Date   CHOL 175 12/01/2012   HDL 44.10 12/01/2012   LDLCALC 104* 12/01/2012   LDLDIRECT 44.4 03/23/2014   TRIG 133.0 12/01/2012   CHOLHDL 4 12/01/2012   diabetes. But well controlled Hypertension but well controlled  Depression Screen None. PHQ2 0   Activities of Daily Living Independent ADLs and IADLs   Hearing Difficulties: -patient declines  Cognitive Testing  Reports some difficulty  Normal 3 word recall  List the Names of Other Physician/Practitioners you currently use: -Dr. Havery Moros for GI for colonoscopy -Eye doctor in [place]  Immunization History  Administered Date(s) Administered  . Influenza,inj,Quad PF,36+ Mos 01/30/2013, 01/08/2015  . Influenza-Unspecified 02/18/2014  . Pneumococcal Polysaccharide-23 07/21/2011  . Td 11/04/2007  . Zoster 08/17/2007   Required Immunizations needed today- Prevnar 13  Screening tests- up to date 05/07/15- tubular adenoma x2. 5 year Health Maintenance Due  Topic Date Due  . Hepatitis C Screening - today  1945-07-28  . PNA vac Low Risk Adult (2 of 2 - PCV13)- today  07/20/2012  . HEMOGLOBIN A1C - today  12/19/2014   ROS- No pertinent positives discovered in course of AWV R)S- no hypoglycemia. No chest pain or shortness of breath. No headache or blurry vision.   The following were reviewed and entered/updated in epic: Past Medical History  Diagnosis Date  . Hyperlipidemia   . Hypertension   . Osteopenia     . Status post dilation of esophageal narrowing   . GERD (gastroesophageal reflux disease)   . Type 2 diabetes mellitus ([place])   . History of adenomatous polyp of colon   . Sigmoid diverticulosis   . History of diverticulitis     duodenal 2012-- resolved  . History of hiatal hernia   . Fecal incontinence     sphincter stim placed 07-2014  . Glaucoma, both eyes   . Arthritis     right arm  . Wears glasses   . Environmental allergies   . Allergy   . Cataract     bilateral and removal  . Thyroid disease    Patient Active Problem List   Diagnosis Date Noted  . Memory loss 08/09/2015    Priority: High  . Fecal incontinence 03/23/2014    Priority: High  . Diabetes mellitus type II, controlled ([place]) 10/19/2006    Priority: High  . History of adenomatous polyp of colon 08/09/2015    Priority: Medium  . H/O partial thyroidectomy 03/23/2014    Priority: Medium  . Hyperlipidemia 10/19/2006    Priority: Medium  . Essential hypertension 10/19/2006    Priority: Medium  . Glaucoma 03/23/2014    Priority: Low  . Family history of malignant neoplasm of gastrointestinal tract 08/26/2010    Priority: Low  . Anemia 08/26/2010    Priority: Low  . Esophageal stricture 07/16/2010    Priority: Low  . Osteopenia 04/22/2007    Priority: Low   Past Surgical History  Procedure Laterality Date  . Thyroidectomy  1979    partial  .  Bunionectomy Bilateral 1985  . Trigger finger release  1999  . Carpal tunnel release Left 08-05-2006  . Retinal detachment surgery  2008  . Knee arthroscopy Right 2013  . Rectal ultrasound N/A 03/20/2013    nerve damage  . Anal rectal manometry N/A 03/20/2013  . Tubal ligation  1976  . Esophagogastroduodenoscopy (egd) with esophageal dilation  last one 09-11-2010  . Colonoscopy w/ polypectomy  last one 02-13-2010  . Cardiovascular stress test  02-13-2005    normal perfusion study/  no ischemia/  normal LV function and wall motion , ef 86%  . Vaginal  hysterectomy  1983  . Laparoscopic nissen fundoplication  [id number]  . Tonsillectomy and adenoidectomy  as child  . Peripheral neuro stimulator  07-2014    sphincter stimulator  . Colonoscopy    . Polypectomy    . Cataract extraction      Family History  Problem Relation Age of Onset  . Diabetes Mother   . Heart disease Mother     CABG 52 in 41  . Heart attack Mother   . Stroke Mother   . Cancer Father 43    liver  . Colon cancer Father   . Colon cancer Maternal Grandmother   . Pancreatic cancer Maternal Grandmother   . Stomach cancer Maternal Grandmother   . Colon polyps Neg Hx   . Esophageal cancer Neg Hx   . Rectal cancer Neg Hx     Medications- reviewed and updated Current Outpatient Prescriptions  Medication Sig Dispense Refill  . aspirin 81 MG tablet Take 81 mg by mouth daily.      Marland Kitchen atorvastatin (LIPITOR) 10 MG tablet TAKE 1 TABLET (10 MG TOTAL) BY MOUTH DAILY. 90 tablet 2  . brimonidine (ALPHAGAN) 0.2 % ophthalmic solution PLACE ONE DROP IN [facility] EYE THREE TIMES DAILY  2  . Calcium Carbonate-Vit D-Min (GNP CALCIUM 1200) 1200-1000 MG-UNIT CHEW Chew 1 tablet by mouth daily.      Marland Kitchen glimepiride (AMARYL) 1 MG tablet TAKE 1 TABLET (1 MG TOTAL) BY MOUTH DAILY BEFORE BREAKFAST. 90 tablet 2  . glucose blood (ONETOUCH VERIO) test strip Use to test blood sugars daily. Dx: E11.9 100 each 11  . latanoprost (XALATAN) 0.005 % ophthalmic solution Place 1 drop into both eyes at bedtime.     . Multiple Vitamins-Iron (MULTIVITAMIN/IRON) TABS Take 1 tablet by mouth daily.      [name] DELICA LANCETS 99991111 MISC Use to check blood sugars daily. Dx: E11.9 100 each 11  . Probiotic Product (ALIGN) 4 MG CAPS Take 1 capsule by mouth daily.      . valsartan-hydrochlorothiazide (DIOVAN-HCT) 80-12.5 MG tablet Take 0.5 tablets by mouth daily. 90 tablet 2     Allergies-reviewed and updated Allergies  Allergen Reactions  . Lisinopril Diarrhea and Other (See Comments)    REACTION:  hallucinations/diarrhea  . Metformin And Related Diarrhea    Social History   Social History  . Marital Status: Married    Spouse Name: N/A  . Number of Children: 2  . Years of Education: N/A   Occupational History  . retired    Social History Main Topics  . Smoking status: Never Smoker   . Smokeless tobacco: Never Used  . Alcohol Use: No  . Drug Use: No  . Sexual Activity: Not on file   Other Topics Concern  . Not on file   Social History Narrative   Married 1967. 2 children (daughters). 7 grandkids.  Retired-[employer] in [place]: reading      Daily caffeine    Objective: BP 112/70 mmHg  Pulse 69  Temp(Src) 97.6 F (36.4 C)  Ht 5\' 3"  (1.6 m)  Wt 120 lb (54.432 kg)  BMI 21.26 kg/m2 Gen: NAD, resting comfortably Pharynx mild erythema, nares largely normal some clear drainage Complete impaction in bilateral ears of cerumen- TM normal after irrigation HEENT: Mucous membranes are moist. Oropharynx normal Neck: no thyromegaly CV: RRR no murmurs rubs or gallops Lungs: CTAB no crackles, wheeze, rhonchi Abdomen: soft/nontender/nondistended/normal bowel sounds. No rebound or guarding.  Ext: no edema Skin: warm, dry, no rash Neuro: grossly normal, moves all extremities, PERRLA  Assessment/Plan:  AWV completed- discussed recommended screenings anddocumented any personalized health advice and referrals for preventive counseling. See AVS as well which was given to patient.   H/O partial thyroidectomy Update TSH today  Diabetes mellitus type II, controlled ([place]) S: previously controlled. On amaryl 1mg  CBGs- 89-120; Exercise and diet- active but encouraged exercise  Lab Results  Component Value Date   HGBA1C 6.9* 06/18/2014   HGBA1C 7.0* 03/23/2014   HGBA1C 9.2* 12/01/2012   A/P: check a1c today  Hyperlipidemia S: well controlled on atorvastatin 10mg  previosuly daily- now taking every other day. No myalgias.  Lab Results    Component Value Date   CHOL 175 12/01/2012   HDL 44.10 12/01/2012   LDLCALC 104* 12/01/2012   LDLDIRECT 44.4 03/23/2014   TRIG 133.0 12/01/2012   CHOLHDL 4 12/01/2012   A/P: update lipids today. She feels like her memory has bene worse since being on statin- we will trial off though I doubt this is etiology  Essential hypertension S: controlled. On valsartan-hctz 80-12.5mg  but just takes half tab  BP Readings from Last 3 Encounters:  08/09/15 112/70  05/20/15 121/69  05/03/15 115/52  A/P:Continue current meds:  Doing well  Memory loss S: after we did memory testing patient admitted to some issues with recall. She often remembers something but it may be several minutes after the discussion. Her husband has noted issues as well. She feels like the issues have been worse since bein gon cholesterol medicine A/P: MMSE 24/30 with 0/3 for 3 word recall, 2 off for serial 7s (had done world testing with initial test so did not reuse) and 1 point off for date. May just be MCI- we will plan on taking off statin and retesting in 3 months. Repeat MMSE at that time and will use serial 7s. Also check labs   2 weeks of sinus congestion, rhinorrhea, sneezing- can use OTC allergy medicine.   Cerumen impaction- bilateral irrigation by Clyde Lundborg CMA   Return in about 3 months (around 11/09/2015) for memory testing. Return precautions advised.   Orders Placed This Encounter  Procedures  . Pneumococcal conjugate vaccine 13-valent  . CBC    [place]  . Comprehensive metabolic panel    [place]    Order Specific Question:  Has the patient fasted?    Answer:  No  . TSH    [place]  . Lipid panel    [place]    Order Specific Question:  Has the patient fasted?    Answer:  No  . Hepatitis C antibody, reflex    [facility]  . Hemoglobin A1c      . Vitamin B12  . RPR    [facility]  . HIV antibody    [facility]   [name], MD

## 2015-08-09 NOTE — Assessment & Plan Note (Addendum)
S: well controlled on atorvastatin 10mg  previosuly daily- now taking every other day. No myalgias.  Lab Results  Component Value Date   CHOL 175 12/01/2012   HDL 44.10 12/01/2012   LDLCALC 104* 12/01/2012   LDLDIRECT 44.4 03/23/2014   TRIG 133.0 12/01/2012   CHOLHDL 4 12/01/2012   A/P: update lipids today. She feels like her memory has bene worse since being on statin- we will trial off though I doubt this is etiology

## 2015-08-09 NOTE — Assessment & Plan Note (Signed)
S: previously controlled. On amaryl 1mg  CBGs- 89-120; Exercise and diet- active but encouraged exercise  Lab Results  Component Value Date   HGBA1C 6.9* 06/18/2014   HGBA1C 7.0* 03/23/2014   HGBA1C 9.2* 12/01/2012   A/P: check a1c today

## 2015-08-09 NOTE — Assessment & Plan Note (Signed)
S: controlled. On valsartan-hctz 80-12.5mg  but just takes half tab  BP Readings from Last 3 Encounters:  08/09/15 112/70  05/20/15 121/69  05/03/15 115/52  A/P:Continue current meds:  Doing well

## 2015-08-09 NOTE — Assessment & Plan Note (Signed)
S: after we did memory testing patient admitted to some issues with recall. She often remembers something but it may be several minutes after the discussion. Her husband has noted issues as well. She feels like the issues have been worse since bein gon cholesterol medicine A/P: MMSE 24/30 with 0/3 for 3 word recall, 2 off for serial 7s (had done world testing with initial test so did not reuse) and 1 point off for date. May just be MCI- we will plan on taking off statin and retesting in 3 months. Repeat MMSE at that time and will use serial 7s. Also check labs

## 2015-08-09 NOTE — Patient Instructions (Addendum)
Final pneumonia shot received today [id number]).   [name] , Thank you for taking time to come for your Medicare Wellness Visit. I appreciate your ongoing commitment to your health goals. Please review the following plan we discussed and let me know if I can assist you in the future.   These are the goals we discussed: 1. Recommend 150 minutes exercise per week 2. Stop cholesterol medicine for next 3 months due to concern that memory may have worsened on it (this may not be reason for memory issue but reasonable to trial off) 3. Please try to keep a record along with husband of issues you have with memory   This is a list of the screening recommended for you and due dates:  Health Maintenance  Topic Date Due  .  Hepatitis C: One time screening is recommended by Center for Disease Control  (CDC) for  adults born from [birth year] through 1966.   08/26/1945  . Hemoglobin A1C  12/19/2014  . Flu Shot  11/05/2015  . Eye exam for diabetics  01/02/2016  . Complete foot exam   04/17/2016  . Mammogram  07/09/2017  . Tetanus Vaccine  11/03/2017  . Colon Cancer Screening  05/02/2020  . DEXA scan (bone density measurement)  Completed  . Shingles Vaccine  Completed  . Pneumonia vaccines  Completed

## 2015-08-09 NOTE — Assessment & Plan Note (Signed)
Update TSH today. 

## 2015-08-10 LAB — HIV ANTIBODY (ROUTINE TESTING W REFLEX): HIV: NONREACTIVE

## 2015-08-10 LAB — HEPATITIS C ANTIBODY: HCV AB: NEGATIVE

## 2015-08-12 LAB — RPR

## 2015-08-16 DIAGNOSIS — Z961 Presence of intraocular lens: Secondary | ICD-10-CM | POA: Diagnosis not present

## 2015-08-16 DIAGNOSIS — H401131 Primary open-angle glaucoma, bilateral, mild stage: Secondary | ICD-10-CM | POA: Diagnosis not present

## 2015-11-01 ENCOUNTER — Other Ambulatory Visit: Payer: Self-pay | Admitting: Family Medicine

## 2015-12-18 DIAGNOSIS — Z23 Encounter for immunization: Secondary | ICD-10-CM | POA: Diagnosis not present

## 2015-12-24 ENCOUNTER — Encounter: Payer: Self-pay | Admitting: Family Medicine

## 2015-12-24 ENCOUNTER — Ambulatory Visit (INDEPENDENT_AMBULATORY_CARE_PROVIDER_SITE_OTHER): Payer: Medicare Other | Admitting: Family Medicine

## 2015-12-24 VITALS — BP 142/68 | HR 79 | Temp 98.1°F | Wt 116.0 lb

## 2015-12-24 DIAGNOSIS — E119 Type 2 diabetes mellitus without complications: Secondary | ICD-10-CM | POA: Diagnosis not present

## 2015-12-24 DIAGNOSIS — R413 Other amnesia: Secondary | ICD-10-CM | POA: Diagnosis not present

## 2015-12-24 DIAGNOSIS — Z114 Encounter for screening for human immunodeficiency virus [HIV]: Secondary | ICD-10-CM | POA: Diagnosis not present

## 2015-12-24 MED ORDER — DONEPEZIL HCL 5 MG PO TABS: 5.0000 mg | ORAL_TABLET | Freq: Every day | ORAL | 5 refills | Status: DC

## 2015-12-24 NOTE — Progress Notes (Signed)
Subjective:  [name] is a 70 y.o. year old very pleasant female patient who presents for/with See problem oriented charting ROS- no extremity weakness, no slurred words, no trouble swallowing, no numbness or tingling in extremities.see any ROS included in HPI as well.   Past Medical History-  Patient Active Problem List   Diagnosis Date Noted  . Memory loss 08/09/2015    Priority: High  . Fecal incontinence 03/23/2014    Priority: High  . Diabetes mellitus type II, controlled ([place]) 10/19/2006    Priority: High  . History of adenomatous polyp of colon 08/09/2015    Priority: Medium  . H/O partial thyroidectomy 03/23/2014    Priority: Medium  . Hyperlipidemia 10/19/2006    Priority: Medium  . Essential hypertension 10/19/2006    Priority: Medium  . Glaucoma 03/23/2014    Priority: Low  . Family history of malignant neoplasm of gastrointestinal tract 08/26/2010    Priority: Low  . Anemia 08/26/2010    Priority: Low  . Esophageal stricture 07/16/2010    Priority: Low  . Osteopenia 04/22/2007    Priority: Low    Medications- reviewed and updated Current Outpatient Prescriptions  Medication Sig Dispense Refill  . aspirin 81 MG tablet Take 81 mg by mouth daily.      . brimonidine (ALPHAGAN) 0.2 % ophthalmic solution PLACE ONE DROP IN [facility] EYE THREE TIMES DAILY  2  . Calcium Carbonate-Vit D-Min (GNP CALCIUM 1200) 1200-1000 MG-UNIT CHEW Chew 1 tablet by mouth daily.      Marland Kitchen glimepiride (AMARYL) 1 MG tablet TAKE 1 TABLET (1 MG TOTAL) BY MOUTH DAILY BEFORE BREAKFAST. 90 tablet 2  . glucose blood (ONETOUCH VERIO) test strip Use to test blood sugars daily. Dx: E11.9 100 each 11  . latanoprost (XALATAN) 0.005 % ophthalmic solution Place 1 drop into both eyes at bedtime.     . Multiple Vitamins-Iron (MULTIVITAMIN/IRON) TABS Take 1 tablet by mouth daily.      [name] DELICA LANCETS 99991111 MISC Use to check blood sugars daily. Dx: E11.9 100 each 11  . Probiotic Product (ALIGN)  4 MG CAPS Take 1 capsule by mouth daily.      . valsartan-hydrochlorothiazide (DIOVAN-HCT) 80-12.5 MG tablet Take 0.5 tablets by mouth daily. 90 tablet 2  . atorvastatin (LIPITOR) 10 MG tablet TAKE ONE TABLET BY MOUTH ONCE DAILY (Patient not taking: Reported on 12/24/2015) 90 tablet 3  . donepezil (ARICEPT) 5 MG tablet Take 1 tablet (5 mg total) by mouth at bedtime. 30 tablet 5   No current facility-administered medications for this visit.     Objective: BP (!) 142/68 (BP Location: Left Arm, Patient Position: Sitting, Cuff Size: Normal)   Pulse 79   Temp 98.1 F (36.7 C) (Oral)   Wt 116 lb (52.6 kg)   SpO2 97%   BMI 20.55 kg/m  Gen: NAD, resting comfortably CV: RRR no murmurs rubs or gallops Lungs: CTAB no crackles, wheeze, rhonchi Abdomen: soft/nontender/nondistended/normal bowel sounds.   Ext: no edema Skin: warm, dry Neuro: CN II-XII intact, sensation and reflexes normal throughout, 5/5 muscle strength in bilateral upper and lower extremities. Normal finger to nose. Normal rapid alternating movements. No pronator drift. Normal romberg. Normal gait- no obvious short shuffling gait.   Assessment/Plan:  Memory loss S: husband comes with patient today. He is able to confirm steady memory loss over last 2 years or so. Her MMSE today was 22/30 dwnf rom 24/30 2 months ago. She lost an additional point for city  as well as additional point on counting backwards by 7s. Per patient request we came off statin medication and there was no improvement and if anything worsening of memory.  A/P: we had extensive discussion today. Reversible causes of dementia workup with labs-  CBC, CMET, RPR, HIV, TSH, B12, MRI negative so far with MRI still pending. PHQ2 or 0. Normal neruo exam. likelihood points to alzheimers. We will start aricept 5mg  (BP controlled in past and likely up due to anxiety about conversation- she is compliant with BP meds). We will repeat examination in 3 months and likely titrate to  10mg  aricept if BP remains ok.  likely change diagnosis code at follow up  3 months  Orders Placed This Encounter  Procedures  . MR Brain W Wo Contrast    Standing Status:   Future    Standing Expiration Date:   02/22/2017    Order Specific Question:   If indicated for the ordered procedure, I authorize the administration of contrast media per Radiology protocol    Answer:   Yes    Order Specific Question:   Reason for Exam (SYMPTOM  OR DIAGNOSIS REQUIRED)    Answer:   dementia MMSE 22/30- rule out stroke as underlying cause but suspect alzheimers    Order Specific Question:   Preferred imaging location?    Answer:   [facility] (table limit-550lbs)    Order Specific Question:   What is the patient's sedation requirement?    Answer:   No Sedation    Order Specific Question:   Does the patient have a pacemaker or implanted devices?    Answer:   No  . CBC    [place]  . Comprehensive metabolic panel    [place]  . HIV antibody  . RPR    [facility]  . TSH    [place]  . Vitamin B12  . Hemoglobin A1c    [place]    Meds ordered this encounter  Medications  . donepezil (ARICEPT) 5 MG tablet    Sig: Take 1 tablet (5 mg total) by mouth at bedtime.    Dispense:  30 tablet    Refill:  5   The duration of face-to-face time during this visit was greater than 30 minutes. Greater than 50% of this time was spent in counseling about dementia, workup, treatments.     Return precautions advised.  [name], MD

## 2015-12-24 NOTE — Patient Instructions (Addendum)
We will call you within a week about your referral for MRI. If you do not hear within 2 weeks, give [place] a call.   Labs before you go to look for reversible causes of memory loss- will also check a1c today. Can stat checking sugars just once a week.   Start aricept after hear back about labs  See me in 3 months- may adjust to higher dose at that time  Restart cholesterol medicine as does not appear to have been the cause

## 2015-12-24 NOTE — Progress Notes (Signed)
Pre visit review using our clinic review tool, if applicable. No additional management support is needed unless otherwise documented below in the visit note. 

## 2015-12-25 ENCOUNTER — Encounter: Payer: Self-pay | Admitting: Family Medicine

## 2015-12-25 LAB — COMPREHENSIVE METABOLIC PANEL
ALK PHOS: 44 U/L (ref 39–117)
ALT: 13 U/L (ref 0–35)
AST: 14 U/L (ref 0–37)
Albumin: 4.4 g/dL (ref 3.5–5.2)
BILIRUBIN TOTAL: 0.4 mg/dL (ref 0.2–1.2)
BUN: 15 mg/dL (ref 6–23)
CO2: 34 meq/L — AB (ref 19–32)
CREATININE: 0.78 mg/dL (ref 0.40–1.20)
Calcium: 9.8 mg/dL (ref 8.4–10.5)
Chloride: 99 mEq/L (ref 96–112)
GFR: 77.37 mL/min (ref >60.00)
GLUCOSE: 116 mg/dL — AB (ref 70–99)
Potassium: 3.5 mEq/L (ref 3.5–5.1)
Sodium: 142 mEq/L (ref 135–145)
TOTAL PROTEIN: 7.1 g/dL (ref 6.0–8.3)

## 2015-12-25 LAB — HEMOGLOBIN A1C: Hgb A1c MFr Bld: 6.2 % (ref 4.6–6.5)

## 2015-12-25 LAB — CBC
HCT: 40.1 % (ref 36.0–46.0)
Hemoglobin: 13.6 g/dL (ref 12.0–15.0)
MCHC: 34 g/dL (ref 30.0–36.0)
MCV: 88.4 fl (ref 78.0–100.0)
Platelets: 333 10*3/uL (ref 150.0–400.0)
RBC: 4.54 Mil/uL (ref 3.87–5.11)
RDW: 13.2 % (ref 11.5–15.5)
WBC: 8.6 10*3/uL (ref 4.0–10.5)

## 2015-12-25 LAB — VITAMIN B12: Vitamin B-12: 471 pg/mL (ref 211–911)

## 2015-12-25 LAB — TSH: TSH: 1.21 u[IU]/mL (ref 0.35–4.50)

## 2015-12-25 LAB — HIV ANTIBODY (ROUTINE TESTING W REFLEX): HIV 1&2 Ab, 4th Generation: NONREACTIVE

## 2015-12-25 LAB — RPR

## 2015-12-25 NOTE — Assessment & Plan Note (Signed)
S: husband comes with patient today. He is able to confirm steady memory loss over last 2 years or so. Her MMSE today was 22/30 dwnf rom 24/30 2 months ago. She lost an additional point for city as well as additional point on counting backwards by 7s. Per patient request we came off statin medication and there was no improvement and if anything worsening of memory.  A/P: we had extensive discussion today. Reversible causes of dementia workup with labs-  CBC, CMET, RPR, HIV, TSH, B12, MRI negative so far with MRI still pending. PHQ2 or 0. Normal neruo exam. likelihood points to alzheimers. We will start aricept 5mg  (BP controlled in past and likely up due to anxiety about conversation- she is compliant with BP meds). We will repeat examination in 3 months and likely titrate to 10mg  aricept if BP remains ok.

## 2015-12-31 ENCOUNTER — Telehealth: Payer: Self-pay | Admitting: Family Medicine

## 2015-12-31 NOTE — Telephone Encounter (Signed)
Can they use the same order for  instead? If not may reorder my prior order with change to it that she does have internal implant- to help with rectal sphincter tone

## 2015-12-31 NOTE — Telephone Encounter (Signed)
Pt has an electronic devise and cannot get MRI at [place]. Notes are in chart. They say is not safe and needs to be done at the hospital. [name] would like a call back to advise on what Dr Yong Channel wants her to do.

## 2016-01-03 NOTE — Telephone Encounter (Signed)
Spoke with radiology tech- needs make and model. Called patient who has info and she will call MRI department at [phone] with the information needed when she collects it at home.

## 2016-01-03 NOTE — Telephone Encounter (Signed)
Spoke with [name] and she says  [facility] will not do the MRI with an implantable device. She did say if you called and explained what type of device they may do it at [facility] but you need to talk to the Radiologist. The number she provided is [phone].

## 2016-01-09 ENCOUNTER — Ambulatory Visit (HOSPITAL_COMMUNITY)
Admission: RE | Admit: 2016-01-09 | Discharge: 2016-01-09 | Disposition: A | Discharge status: Home / Self Care | Payer: Medicare Other | Source: Ambulatory Visit | Attending: Family Medicine | Admitting: Family Medicine

## 2016-01-09 DIAGNOSIS — I6782 Cerebral ischemia: Secondary | ICD-10-CM | POA: Diagnosis not present

## 2016-01-09 DIAGNOSIS — G9389 Other specified disorders of brain: Secondary | ICD-10-CM | POA: Insufficient documentation

## 2016-01-09 DIAGNOSIS — R413 Other amnesia: Secondary | ICD-10-CM | POA: Diagnosis not present

## 2016-01-09 MED ORDER — GADOBENATE DIMEGLUMINE 529 MG/ML IV SOLN
10.0000 mL | Freq: Once | INTRAVENOUS | Status: AC | PRN
  Administered 2016-01-09: 10 mL via INTRAVENOUS

## 2016-01-13 ENCOUNTER — Telehealth: Payer: Self-pay | Admitting: Family Medicine

## 2016-01-13 NOTE — Telephone Encounter (Signed)
Pt would like to have the plan of care after the MRI 01/09/16.

## 2016-01-14 ENCOUNTER — Other Ambulatory Visit: Payer: Self-pay

## 2016-01-14 DIAGNOSIS — R413 Other amnesia: Secondary | ICD-10-CM

## 2016-01-14 NOTE — Telephone Encounter (Signed)
Spoke with patient earlier today and advised of next steps.

## 2016-03-02 ENCOUNTER — Other Ambulatory Visit: Payer: Self-pay | Admitting: Family Medicine

## 2016-03-19 ENCOUNTER — Ambulatory Visit (INDEPENDENT_AMBULATORY_CARE_PROVIDER_SITE_OTHER): Payer: Medicare Other | Admitting: Neurology

## 2016-03-19 ENCOUNTER — Encounter: Payer: Self-pay | Admitting: Neurology

## 2016-03-19 VITALS — BP 122/70 | HR 87 | Ht 63.0 in | Wt 114.1 lb

## 2016-03-19 DIAGNOSIS — G3184 Mild cognitive impairment, so stated: Secondary | ICD-10-CM | POA: Diagnosis not present

## 2016-03-19 NOTE — Patient Instructions (Signed)
1. Continue Aricept 5mg  daily 2. Control of blood pressure, cholesterol, diabetes, as well as physical exercise and brain stimulation exercises are important for brain health 3. Follow-up in 6 months, call for any changes

## 2016-03-19 NOTE — Progress Notes (Signed)
NEUROLOGY CONSULTATION NOTE  [name] MRN: [id number] DOB: 05-Mar-1946  Referring provider: Dr. [name] Primary care provider: Dr. [name]  Reason for consult:  Memory loss, abnormal MRI brain  Dear Dr Yong Channel:  Thank you for your kind referral of [name] for consultation of the above symptoms. Although her history is well known to you, please allow me to reiterate it for the purpose of our medical record. The patient was accompanied to the clinic by her husband and daughter who also provide collateral information. Records and images were personally reviewed where available.  HISTORY OF PRESENT ILLNESS: This is a pleasant 70 year old right-handed woman with a history of hypertension, hyperlipidemia, diabetes, fecal incontinence, presenting for evaluation of report of abnormal brain MRI ordered for memory loss. She feels her memory is not as good as it was, she has noticed a gradual decline with memory for names, dates, and conversations. 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 Her motor functions are not where they used to be. She now questions her ability to know how to get places she has been to multiple times in the past. She left groceries in the back of her car. One time she double paid their church in one month. She denies any missed medications. Her daughter has noticed she would repeat the same conversation. She has more difficulty organizing a meal and does not cook very much anymore. They deny any gait changes, but report she has fallen a couple of times due to missteps. She denies any urinary incontinence. She has a chronic history of fecal incontinence since she delivered her children, and had a [employer] device stimulating her sphincter muscle. This has helped, but she continues to have diarrhea at least once a week. Her husband reports a weight loss of  25 lbs in the past year. Her appetite is not as good as it was. Sleep is good. She denies any headaches, vision changes, dysarthria/dysphagia, neck/back pain, focal numbness/tingling/weakness. No anosmia. She has mild tremors when anxious. They report an incident of loss of consciousness a week after her MRI, she walked into the bathroom then next thing she knew her head was on the cabinet. She was not gone for a long time per family, probably 5 minutes, but she was unable to tell them what had happened on how she hit her forehead. She may have been dizzy, denied any confusion or headache. No other episodes of loss of consciousness in the past. There is no family history of dementia. She denies any alcohol intake, no other head injuries. She was started on Aricept which she is tolerating without side effects.  I personally reviewed MRI brain with and without contrast done 10/1=5/17 which did not show any acute changes. There was mild chronic microvascular disease and mild volume loss. There was note of enlargement of the lateral and third ventricles which is borderline disproportional to the degree of parenchymal volume loss and increased from 2007. No transependymal flow seen, patent cerebral aqueduct.  Laboratory Data: Lab Results  Component Value Date   WBC 8.6 12/24/2015   HGB 13.6 12/24/2015   HCT 40.1 12/24/2015   MCV 88.4 12/24/2015   PLT 333.0 12/24/2015     Chemistry      Component Value Date/Time   NA 142 12/24/2015 1531   K 3.5 12/24/2015 1531   CL 99 12/24/2015  1531   CO2 34 (H) 12/24/2015 1531   BUN 15 12/24/2015 1531   CREATININE 0.78 12/24/2015 1531      Component Value Date/Time   CALCIUM 9.8 12/24/2015 1531   ALKPHOS 44 12/24/2015 1531   AST 14 12/24/2015 1531   ALT 13 12/24/2015 1531   BILITOT 0.4 12/24/2015 1531     Lab Results  Component Value Date   TSH 1.21 12/24/2015   Lab Results  Component Value Date   [id number] 12/24/2015     PAST MEDICAL  HISTORY: Past Medical History:  Diagnosis Date  . Allergy   . Arthritis    right arm  . Cataract    bilateral and removal  . Environmental allergies   . Fecal incontinence    sphincter stim placed 07-2014  . GERD (gastroesophageal reflux disease)   . Glaucoma, both eyes   . History of adenomatous polyp of colon   . History of diverticulitis    duodenal 2012-- resolved  . History of hiatal hernia   . Hyperlipidemia   . Hypertension   . Osteopenia   . Sigmoid diverticulosis   . Status post dilation of esophageal narrowing   . Thyroid disease   . Type 2 diabetes mellitus ([place])   . Wears glasses     PAST SURGICAL HISTORY: Past Surgical History:  Procedure Laterality Date  . ANAL RECTAL MANOMETRY N/A 03/20/2013  . BUNIONECTOMY Bilateral 1985  . CARDIOVASCULAR STRESS TEST  02-13-2005   normal perfusion study/  no ischemia/  normal LV function and wall motion , ef 86%  . CARPAL TUNNEL RELEASE Left 08-05-2006  . CATARACT EXTRACTION    . COLONOSCOPY    . COLONOSCOPY W/ POLYPECTOMY  last one 02-13-2010  . ESOPHAGOGASTRODUODENOSCOPY (EGD) WITH ESOPHAGEAL DILATION  last one 09-11-2010  . KNEE ARTHROSCOPY Right 2013  . LAPAROSCOPIC NISSEN FUNDOPLICATION  [id number]  . peripheral neuro stimulator  07-2014   sphincter stimulator  . POLYPECTOMY    . RECTAL ULTRASOUND N/A 03/20/2013   nerve damage  . RETINAL DETACHMENT SURGERY  2008  . THYROIDECTOMY  1979   partial  . TONSILLECTOMY AND ADENOIDECTOMY  as child  . TRIGGER FINGER RELEASE  1999  . TUBAL LIGATION  1976  . VAGINAL HYSTERECTOMY  1983    MEDICATIONS: Current Outpatient Prescriptions on File Prior to Visit  Medication Sig Dispense Refill  . aspirin 81 MG tablet Take 81 mg by mouth daily.      Marland Kitchen atorvastatin (LIPITOR) 10 MG tablet TAKE ONE TABLET BY MOUTH ONCE DAILY 90 tablet 3  . brimonidine (ALPHAGAN) 0.2 % ophthalmic solution PLACE ONE DROP IN [facility] EYE THREE TIMES DAILY  2  . Calcium Carbonate-Vit D-Min (GNP CALCIUM  1200) 1200-1000 MG-UNIT CHEW Chew 1 tablet by mouth daily.      Marland Kitchen donepezil (ARICEPT) 5 MG tablet Take 1 tablet (5 mg total) by mouth at bedtime. 30 tablet 5  . glimepiride (AMARYL) 1 MG tablet TAKE ONE TABLET BY MOUTH ONCE DAILY BEFORE BREAKFAST. 90 tablet 2  . glucose blood (ONETOUCH VERIO) test strip Use to test blood sugars daily. Dx: E11.9 100 each 11  . latanoprost (XALATAN) 0.005 % ophthalmic solution Place 1 drop into both eyes at bedtime.     . Multiple Vitamins-Iron (MULTIVITAMIN/IRON) TABS Take 1 tablet by mouth daily.      [name] DELICA LANCETS 99991111 MISC Use to check blood sugars daily. Dx: E11.9 100 each 11  . Probiotic Product (ALIGN) 4 MG CAPS  Take 1 capsule by mouth daily.      . valsartan-hydrochlorothiazide (DIOVAN-HCT) 80-12.5 MG tablet Take 0.5 tablets by mouth daily. 90 tablet 2   No current facility-administered medications on file prior to visit.     ALLERGIES: Allergies  Allergen Reactions  . Lisinopril Diarrhea and Other (See Comments)    REACTION: hallucinations/diarrhea  . Metformin And Related Diarrhea    FAMILY HISTORY: Family History  Problem Relation Age of Onset  . Diabetes Mother   . Heart disease Mother     CABG 28 in 81  . Heart attack Mother   . Stroke Mother   . Cancer Father 48    liver  . Colon cancer Father   . Colon cancer Maternal Grandmother   . Pancreatic cancer Maternal Grandmother   . Stomach cancer Maternal Grandmother   . Colon polyps Neg Hx   . Esophageal cancer Neg Hx   . Rectal cancer Neg Hx     SOCIAL HISTORY: Social History   Social History  . Marital status: Married    Spouse name: N/A  . Number of children: 2  . Years of education: N/A   Occupational History  . retired Retired   Social History Main Topics  . Smoking status: Never Smoker  . Smokeless tobacco: Never Used  . Alcohol use No  . Drug use: No  . Sexual activity: Not on file   Other Topics Concern  . Not on file   Social History  Narrative   Married 1967. 2 children (daughters). 7 grandkids.       Retired-[employer] in [place]: reading      Daily caffeine    REVIEW OF SYSTEMS: Constitutional: No fevers, chills, or sweats, no generalized fatigue, change in appetite Eyes: No visual changes, double vision, eye pain Ear, nose and throat: No hearing loss, ear pain, nasal congestion, sore throat Cardiovascular: No chest pain, palpitations Respiratory:  No shortness of breath at rest or with exertion, wheezes GastrointestinaI: No nausea, vomiting, diarrhea, abdominal pain, +fecal incontinence Genitourinary:  No dysuria, urinary retention or frequency Musculoskeletal:  No neck pain, back pain Integumentary: No rash, pruritus, skin lesions Neurological: as above Psychiatric: No depression, insomnia, anxiety Endocrine: No palpitations, fatigue, diaphoresis, mood swings, change in appetite, change in weight, increased thirst Hematologic/Lymphatic:  No anemia, purpura, petechiae. Allergic/Immunologic: no itchy/runny eyes, nasal congestion, recent allergic reactions, rashes  PHYSICAL EXAM: Vitals:   03/19/16 1021  BP: 122/70  Pulse: 87   General: No acute distress Head:  Normocephalic/atraumatic Eyes: Fundoscopic exam shows bilateral sharp discs, no vessel changes, exudates, or hemorrhages Neck: supple, no paraspinal tenderness, full range of motion Back: No paraspinal tenderness Heart: regular rate and rhythm Lungs: Clear to auscultation bilaterally. Vascular: No carotid bruits. Skin/Extremities: No rash, no edema Neurological Exam: Mental status: alert and oriented to person, place, and time, no dysarthria or aphasia, Fund of knowledge is appropriate.  Remote memory intact.  Attention and concentration are normal.    Able to name objects and repeat phrases.  Montreal Cognitive Assessment  03/19/2016  Visuospatial/ Executive (0/5) 3  Naming (0/3) 3  Attention: Read list of digits (0/2) 1   Attention: Read list of letters (0/1) 1  Attention: Serial 7 subtraction starting at 100 (0/3) 3  Language: Repeat phrase (0/2) 2  Language : Fluency (0/1) 0  Abstraction (0/2) 2  Delayed Recall (0/5) 0  Orientation (0/6) 5  Total 20   Cranial nerves: CN I:  not tested CN II: pupils equal, round and reactive to light, visual fields intact, fundi unremarkable. CN III, IV, VI:  full range of motion, no nystagmus, no ptosis CN V: facial sensation intact CN VII: upper and lower face symmetric CN VIII: hearing intact to finger rub CN IX, X: gag intact, uvula midline CN XI: sternocleidomastoid and trapezius muscles intact CN XII: tongue midline Bulk & Tone: normal, no cogwheeling, no fasciculations. Motor: 5/5 throughout with no pronator drift. Sensation: intact to light touch, cold, pin, vibration and joint position sense.  No extinction to double simultaneous stimulation.  Romberg test negative Deep Tendon Reflexes: +2 throughout, no ankle clonus Plantar responses: downgoing bilaterally Cerebellar: no incoordination on finger to nose, heel to shin. No dysdiadochokinesia Gait: narrow-based and steady, good arm swing, able to tandem walk adequately. No magnetic gait noted Tremor: none  IMPRESSION: This is a pleasant 70 year old right-handed woman with a history of hypertension, hyperlipidemia, diabetes, fecal incontinence, presenting for evaluation of report of abnormal brain MRI ordered for memory loss. 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 She does not have any other features of NPH except for the memory changes. No magnetic gait noted, no urinary incontinence. Findings were discussed with family, MOCA score today is 20/30, indicating mild cognitive impairment, however history suggestive of possible mild dementia.  she is now on  Aricept 5 mg daily. We discussed the importance of control of vascular risk factors, physical exercise, and brain stimulation exercises for brain health. She will discuss fall and brief syncopal episode with her PCP. She will follow-up in 6 months.   Thank you for allowing me to participate in the care of this patient. Please do not hesitate to call for any questions or concerns.   Ellouise Newer, M.D.  CC: Dr. [name]

## 2016-03-24 ENCOUNTER — Encounter: Payer: Self-pay | Admitting: Family Medicine

## 2016-03-24 ENCOUNTER — Ambulatory Visit (INDEPENDENT_AMBULATORY_CARE_PROVIDER_SITE_OTHER): Payer: Medicare Other | Admitting: Family Medicine

## 2016-03-24 ENCOUNTER — Encounter: Payer: Self-pay | Admitting: Neurology

## 2016-03-24 VITALS — BP 122/68 | HR 81 | Temp 97.4°F | Ht 63.0 in | Wt 114.6 lb

## 2016-03-24 DIAGNOSIS — E785 Hyperlipidemia, unspecified: Secondary | ICD-10-CM | POA: Diagnosis not present

## 2016-03-24 DIAGNOSIS — E119 Type 2 diabetes mellitus without complications: Secondary | ICD-10-CM

## 2016-03-24 DIAGNOSIS — G3184 Mild cognitive impairment, so stated: Secondary | ICD-10-CM | POA: Diagnosis not present

## 2016-03-24 DIAGNOSIS — I10 Essential (primary) hypertension: Secondary | ICD-10-CM

## 2016-03-24 NOTE — Assessment & Plan Note (Signed)
S: controlled on valsartan-hctz 80-12.5mg  (half tablet of this) BP Readings from Last 3 Encounters:  03/24/16 122/68  03/19/16 122/70  12/24/15 (!) 142/68  A/P:Continue current medications- doing well

## 2016-03-24 NOTE — Patient Instructions (Signed)
No changes today  If you would like- can refer you to GI to discuss chronic diarrhea issue despite fecal nerve stimulator  Glad memory is stable- and medicine is tolerable  Have them send me a copy of your eye exam from january

## 2016-03-24 NOTE — Assessment & Plan Note (Signed)
S: well controlled on atorvastatin 10mg  every other day. No myalgias.  Lab Results  Component Value Date   CHOL 140 08/09/2015   HDL 62.60 08/09/2015   LDLCALC 49 08/09/2015   LDLDIRECT 44.4 03/23/2014   TRIG 141.0 08/09/2015   CHOLHDL 2 08/09/2015   A/P: continue current medicines, doing well

## 2016-03-24 NOTE — Assessment & Plan Note (Signed)
S: well controlled. On amaryl 1mg . Had worse diarrhea on metformin. Weight stable Lab Results  Component Value Date   HGBA1C 6.2 12/24/2015   HGBA1C 6.4 08/09/2015   HGBA1C 6.9 (H) 06/18/2014   A/P: we opted to repeat a1c at 3 month visit- also get cmp, cbc, tsh at that time

## 2016-03-24 NOTE — Progress Notes (Signed)
Subjective:  [name] is a 70 y.o. year old very pleasant female patient who presents for/with See problem oriented charting ROS- No chest pain or shortness of breath. No headache or blurry vision. No palpitations or shortness of breath. Mechanical fall noted below   Past Medical History-  Patient Active Problem List   Diagnosis Date Noted  . Mild cognitive impairment 08/09/2015    Priority: High  . Fecal incontinence 03/23/2014    Priority: High  . Diabetes mellitus type II, controlled ([place]) 10/19/2006    Priority: High  . History of adenomatous polyp of colon 08/09/2015    Priority: Medium  . H/O partial thyroidectomy 03/23/2014    Priority: Medium  . Hyperlipidemia 10/19/2006    Priority: Medium  . Essential hypertension 10/19/2006    Priority: Medium  . Glaucoma 03/23/2014    Priority: Low  . Family history of malignant neoplasm of gastrointestinal tract 08/26/2010    Priority: Low  . Anemia 08/26/2010    Priority: Low  . Esophageal stricture 07/16/2010    Priority: Low  . Osteopenia 04/22/2007    Priority: Low    Medications- reviewed and updated Current Outpatient Prescriptions  Medication Sig Dispense Refill  . aspirin 81 MG tablet Take 81 mg by mouth daily.      Marland Kitchen atorvastatin (LIPITOR) 10 MG tablet TAKE ONE TABLET BY MOUTH ONCE DAILY 90 tablet 3  . brimonidine (ALPHAGAN) 0.2 % ophthalmic solution PLACE ONE DROP IN [facility] EYE THREE TIMES DAILY  2  . Calcium Carbonate-Vit D-Min (GNP CALCIUM 1200) 1200-1000 MG-UNIT CHEW Chew 1 tablet by mouth daily.      Marland Kitchen donepezil (ARICEPT) 5 MG tablet Take 1 tablet (5 mg total) by mouth at bedtime. 30 tablet 5  . glimepiride (AMARYL) 1 MG tablet TAKE ONE TABLET BY MOUTH ONCE DAILY BEFORE BREAKFAST. 90 tablet 2  . glucose blood (ONETOUCH VERIO) test strip Use to test blood sugars daily. Dx: E11.9 100 each 11  . latanoprost (XALATAN) 0.005 % ophthalmic solution Place 1 drop into both eyes at bedtime.     . Multiple  Vitamins-Iron (MULTIVITAMIN/IRON) TABS Take 1 tablet by mouth daily.      [name] DELICA LANCETS 99991111 MISC Use to check blood sugars daily. Dx: E11.9 100 each 11  . Probiotic Product (ALIGN) 4 MG CAPS Take 1 capsule by mouth daily.      . valsartan-hydrochlorothiazide (DIOVAN-HCT) 80-12.5 MG tablet Take 0.5 tablets by mouth daily. 90 tablet 2   No current facility-administered medications for this visit.     Objective: BP 122/68   Pulse 81   Temp 97.4 F (36.3 C) (Oral)   Ht 5\' 3"  (1.6 m)   Wt 114 lb 9.6 oz (52 kg)   SpO2 97%   BMI 20.30 kg/m  Gen: NAD, resting comfortably CV: RRR no murmurs rubs or gallops Lungs: CTAB no crackles, wheeze, rhonchi Abdomen: soft/nontender/nondistended/normal bowel sounds. No rebound or guarding.  Ext: no edema Skin: warm, dry, no rash Neuro: memory issues not notable with typical conversation.    Assessment/Plan:  Also fecal incontinence- may call for GI referral, declines for now   Essential hypertension S: controlled on valsartan-hctz 80-12.5mg  (half tablet of this) BP Readings from Last 3 Encounters:  03/24/16 122/68  03/19/16 122/70  12/24/15 (!) 142/68  A/P:Continue current medications- doing well  Hyperlipidemia S: well controlled on atorvastatin 10mg  every other day. No myalgias.  Lab Results  Component Value Date   CHOL 140 08/09/2015  HDL 62.60 08/09/2015   LDLCALC 49 08/09/2015   LDLDIRECT 44.4 03/23/2014   TRIG 141.0 08/09/2015   CHOLHDL 2 08/09/2015   A/P: continue current medicines, doing well  Diabetes mellitus type II, controlled ([place]) S: well controlled. On amaryl 1mg . Had worse diarrhea on metformin. Weight stable Lab Results  Component Value Date   HGBA1C 6.2 12/24/2015   HGBA1C 6.4 08/09/2015   HGBA1C 6.9 (H) 06/18/2014   A/P: we opted to repeat a1c at 3 month visit- also get cmp, cbc, tsh at that time  Fall S: went into the batroom a week after MRI (mid October). She tripped and then she fell forward  and head hit the cabinet. Next thing she knew she woke up and noted a knot on her head. Was only out a few seconds. Came outside, family saw knot on her head. No headaches or issues since that time.  No other falls injuries, no palpitations, no chest pain, no shortness of breath.  A/P: no residual effects after fall- we discussed if recurred to present immediately. Sounds like mechanical fall tripping and then hit head with brief syncope. If unprovoked syncope discussed further workup  Return in about 3 months (around 06/22/2016).  Return precautions advised.  [name], MD

## 2016-03-24 NOTE — Progress Notes (Signed)
Pre visit review using our clinic review tool, if applicable. No additional management support is needed unless otherwise documented below in the visit note. 

## 2016-04-09 ENCOUNTER — Observation Stay (HOSPITAL_COMMUNITY)
Admission: EM | Admit: 2016-04-09 | Discharge: 2016-04-11 | Disposition: A | Discharge status: Home / Self Care | Payer: Medicare Other | Attending: Internal Medicine | Admitting: Internal Medicine

## 2016-04-09 ENCOUNTER — Observation Stay (HOSPITAL_COMMUNITY): Payer: Medicare Other

## 2016-04-09 ENCOUNTER — Emergency Department (HOSPITAL_COMMUNITY): Payer: Medicare Other

## 2016-04-09 ENCOUNTER — Encounter (HOSPITAL_COMMUNITY): Payer: Self-pay | Admitting: *Deleted

## 2016-04-09 DIAGNOSIS — Z86010 Personal history of colonic polyps: Secondary | ICD-10-CM | POA: Diagnosis not present

## 2016-04-09 DIAGNOSIS — R55 Syncope and collapse: Secondary | ICD-10-CM | POA: Diagnosis not present

## 2016-04-09 DIAGNOSIS — Z90710 Acquired absence of both cervix and uterus: Secondary | ICD-10-CM | POA: Diagnosis not present

## 2016-04-09 DIAGNOSIS — K219 Gastro-esophageal reflux disease without esophagitis: Secondary | ICD-10-CM | POA: Diagnosis not present

## 2016-04-09 DIAGNOSIS — E785 Hyperlipidemia, unspecified: Secondary | ICD-10-CM | POA: Diagnosis not present

## 2016-04-09 DIAGNOSIS — E119 Type 2 diabetes mellitus without complications: Secondary | ICD-10-CM | POA: Diagnosis not present

## 2016-04-09 DIAGNOSIS — I441 Atrioventricular block, second degree: Secondary | ICD-10-CM | POA: Diagnosis not present

## 2016-04-09 DIAGNOSIS — A0811 Acute gastroenteropathy due to Norwalk agent: Secondary | ICD-10-CM | POA: Insufficient documentation

## 2016-04-09 DIAGNOSIS — M1991 Primary osteoarthritis, unspecified site: Secondary | ICD-10-CM | POA: Insufficient documentation

## 2016-04-09 DIAGNOSIS — E860 Dehydration: Secondary | ICD-10-CM | POA: Diagnosis not present

## 2016-04-09 DIAGNOSIS — R109 Unspecified abdominal pain: Secondary | ICD-10-CM | POA: Insufficient documentation

## 2016-04-09 DIAGNOSIS — Z7984 Long term (current) use of oral hypoglycemic drugs: Secondary | ICD-10-CM | POA: Diagnosis not present

## 2016-04-09 DIAGNOSIS — M8580 Other specified disorders of bone density and structure, unspecified site: Secondary | ICD-10-CM | POA: Insufficient documentation

## 2016-04-09 DIAGNOSIS — Z7982 Long term (current) use of aspirin: Secondary | ICD-10-CM | POA: Insufficient documentation

## 2016-04-09 DIAGNOSIS — E876 Hypokalemia: Secondary | ICD-10-CM | POA: Diagnosis not present

## 2016-04-09 DIAGNOSIS — I10 Essential (primary) hypertension: Secondary | ICD-10-CM | POA: Insufficient documentation

## 2016-04-09 DIAGNOSIS — Z8719 Personal history of other diseases of the digestive system: Secondary | ICD-10-CM | POA: Diagnosis not present

## 2016-04-09 DIAGNOSIS — R159 Full incontinence of feces: Secondary | ICD-10-CM | POA: Diagnosis not present

## 2016-04-09 DIAGNOSIS — R112 Nausea with vomiting, unspecified: Secondary | ICD-10-CM

## 2016-04-09 DIAGNOSIS — S3991XA Unspecified injury of abdomen, initial encounter: Secondary | ICD-10-CM | POA: Diagnosis not present

## 2016-04-09 DIAGNOSIS — F0390 Unspecified dementia without behavioral disturbance: Secondary | ICD-10-CM | POA: Insufficient documentation

## 2016-04-09 DIAGNOSIS — Z888 Allergy status to other drugs, medicaments and biological substances status: Secondary | ICD-10-CM | POA: Diagnosis not present

## 2016-04-09 DIAGNOSIS — E1165 Type 2 diabetes mellitus with hyperglycemia: Secondary | ICD-10-CM | POA: Diagnosis not present

## 2016-04-09 DIAGNOSIS — S0990XA Unspecified injury of head, initial encounter: Secondary | ICD-10-CM | POA: Diagnosis not present

## 2016-04-09 DIAGNOSIS — K529 Noninfective gastroenteritis and colitis, unspecified: Secondary | ICD-10-CM | POA: Diagnosis not present

## 2016-04-09 DIAGNOSIS — H409 Unspecified glaucoma: Secondary | ICD-10-CM | POA: Insufficient documentation

## 2016-04-09 DIAGNOSIS — R1110 Vomiting, unspecified: Secondary | ICD-10-CM

## 2016-04-09 DIAGNOSIS — R197 Diarrhea, unspecified: Secondary | ICD-10-CM | POA: Diagnosis present

## 2016-04-09 DIAGNOSIS — R404 Transient alteration of awareness: Secondary | ICD-10-CM | POA: Diagnosis not present

## 2016-04-09 LAB — CBC
HCT: 41.6 % (ref 36.0–46.0)
HCT: 33.3 % — ABNORMAL LOW (ref 36.0–46.0)
HEMOGLOBIN: 13.8 g/dL (ref 12.0–15.0)
HEMOGLOBIN: 11.2 g/dL — AB (ref 12.0–15.0)
MCH: 29.4 pg (ref 26.0–34.0)
MCH: 29.9 pg (ref 26.0–34.0)
MCHC: 33.2 g/dL (ref 30.0–36.0)
MCHC: 33.6 g/dL (ref 30.0–36.0)
MCV: 88.7 fL (ref 78.0–100.0)
MCV: 88.8 fL (ref 78.0–100.0)
PLATELETS: 312 10*3/uL (ref 150–400)
Platelets: 252 10*3/uL (ref 150–400)
RBC: 4.69 MIL/uL (ref 3.87–5.11)
RBC: 3.75 MIL/uL — ABNORMAL LOW (ref 3.87–5.11)
RDW: 12.7 % (ref 11.5–15.5)
RDW: 12.7 % (ref 11.5–15.5)
WBC: 17.3 10*3/uL — AB (ref 4.0–10.5)
WBC: 14.6 10*3/uL — ABNORMAL HIGH (ref 4.0–10.5)

## 2016-04-09 LAB — GLUCOSE, CAPILLARY
GLUCOSE-CAPILLARY: 110 mg/dL — AB (ref 65–99)
Glucose-Capillary: 84 mg/dL (ref 65–99)

## 2016-04-09 LAB — RESPIRATORY PANEL BY PCR
ADENOVIRUS-RVPPCR: NOT DETECTED
BORDETELLA PERTUSSIS-RVPCR: NOT DETECTED
CHLAMYDOPHILA PNEUMONIAE-RVPPCR: NOT DETECTED
CORONAVIRUS 229E-RVPPCR: NOT DETECTED
CORONAVIRUS HKU1-RVPPCR: NOT DETECTED
CORONAVIRUS NL63-RVPPCR: NOT DETECTED
Coronavirus OC43: NOT DETECTED
Influenza A: NOT DETECTED
Influenza B: NOT DETECTED
Metapneumovirus: NOT DETECTED
Mycoplasma pneumoniae: NOT DETECTED
PARAINFLUENZA VIRUS 3-RVPPCR: NOT DETECTED
Parainfluenza Virus 1: NOT DETECTED
Parainfluenza Virus 2: NOT DETECTED
Parainfluenza Virus 4: NOT DETECTED
RHINOVIRUS / ENTEROVIRUS - RVPPCR: NOT DETECTED
Respiratory Syncytial Virus: NOT DETECTED

## 2016-04-09 LAB — URINALYSIS, ROUTINE W REFLEX MICROSCOPIC
Bilirubin Urine: NEGATIVE
Glucose, UA: NEGATIVE mg/dL
Hgb urine dipstick: NEGATIVE
Ketones, ur: NEGATIVE mg/dL
Leukocytes, UA: NEGATIVE
NITRITE: NEGATIVE
Protein, ur: NEGATIVE mg/dL
SPECIFIC GRAVITY, URINE: 1.045 — AB (ref 1.005–1.030)
pH: 5 (ref 5.0–8.0)

## 2016-04-09 LAB — CREATININE, SERUM
CREATININE: 0.91 mg/dL (ref 0.44–1.00)
GFR calc Af Amer: >60 mL/min (ref >60)
GFR calc non Af Amer: >60 mL/min (ref >60)

## 2016-04-09 LAB — GASTROINTESTINAL PANEL BY PCR, STOOL (REPLACES STOOL CULTURE)
ASTROVIRUS: NOT DETECTED
Adenovirus F40/41: NOT DETECTED
Campylobacter species: NOT DETECTED
Cryptosporidium: NOT DETECTED
Cyclospora cayetanensis: NOT DETECTED
ENTEROAGGREGATIVE E COLI (EAEC): NOT DETECTED
ENTEROTOXIGENIC E COLI (ETEC): NOT DETECTED
Entamoeba histolytica: NOT DETECTED
Enteropathogenic E coli (EPEC): NOT DETECTED
GIARDIA LAMBLIA: NOT DETECTED
NOROVIRUS GI/GII: DETECTED — AB
Plesimonas shigelloides: NOT DETECTED
ROTAVIRUS A: NOT DETECTED
SALMONELLA SPECIES: NOT DETECTED
SAPOVIRUS (I, II, IV, AND V): NOT DETECTED
SHIGA LIKE TOXIN PRODUCING E COLI (STEC): NOT DETECTED
SHIGELLA/ENTEROINVASIVE E COLI (EIEC): NOT DETECTED
VIBRIO CHOLERAE: NOT DETECTED
Vibrio species: NOT DETECTED
Yersinia enterocolitica: NOT DETECTED

## 2016-04-09 LAB — C DIFFICILE QUICK SCREEN W PCR REFLEX
C DIFFICILE (CDIFF) TOXIN: NEGATIVE
C DIFFICLE (CDIFF) ANTIGEN: NEGATIVE
C Diff interpretation: NOT DETECTED

## 2016-04-09 LAB — MAGNESIUM: MAGNESIUM: 1.5 mg/dL — AB (ref 1.7–2.4)

## 2016-04-09 LAB — BASIC METABOLIC PANEL
ANION GAP: 12 (ref 5–15)
BUN: 17 mg/dL (ref 6–20)
CALCIUM: 9.7 mg/dL (ref 8.9–10.3)
CO2: 26 mmol/L (ref 22–32)
CREATININE: 0.9 mg/dL (ref 0.44–1.00)
Chloride: 102 mmol/L (ref 101–111)
Glucose, Bld: 195 mg/dL — ABNORMAL HIGH (ref 65–99)
Potassium: 3.3 mmol/L — ABNORMAL LOW (ref 3.5–5.1)
SODIUM: 140 mmol/L (ref 135–145)

## 2016-04-09 LAB — CBG MONITORING, ED
GLUCOSE-CAPILLARY: 185 mg/dL — AB (ref 65–99)
Glucose-Capillary: 133 mg/dL — ABNORMAL HIGH (ref 65–99)

## 2016-04-09 LAB — I-STAT CG4 LACTIC ACID, ED: Lactic Acid, Venous: 1.82 mmol/L (ref 0.5–1.9)

## 2016-04-09 LAB — LIPASE, BLOOD: LIPASE: 22 U/L (ref 11–51)

## 2016-04-09 LAB — TSH: TSH: 1.03 u[IU]/mL (ref 0.350–4.500)

## 2016-04-09 LAB — INFLUENZA PANEL BY PCR (TYPE A & B)
INFLAPCR: NEGATIVE
INFLBPCR: NEGATIVE

## 2016-04-09 LAB — PHOSPHORUS: PHOSPHORUS: 1.6 mg/dL — AB (ref 2.5–4.6)

## 2016-04-09 MED ORDER — SODIUM CHLORIDE 0.9 % IV BOLUS (SEPSIS)
1000.0000 mL | Freq: Once | INTRAVENOUS | Status: AC
  Administered 2016-04-09: 1000 mL via INTRAVENOUS

## 2016-04-09 MED ORDER — ONDANSETRON HCL 4 MG/2ML IJ SOLN
INTRAMUSCULAR | Status: AC
  Filled 2016-04-09: qty 2

## 2016-04-09 MED ORDER — ONDANSETRON HCL 4 MG/2ML IJ SOLN
4.0000 mg | Freq: Once | INTRAMUSCULAR | Status: AC
  Administered 2016-04-09: 4 mg via INTRAVENOUS
  Filled 2016-04-09: qty 2

## 2016-04-09 MED ORDER — PROMETHAZINE HCL 25 MG PO TABS
12.5000 mg | ORAL_TABLET | Freq: Four times a day (QID) | ORAL | Status: DC | PRN
  Administered 2016-04-09: 12.5 mg via ORAL
  Filled 2016-04-09: qty 1

## 2016-04-09 MED ORDER — ENOXAPARIN SODIUM 40 MG/0.4ML ~~LOC~~ SOLN
40.0000 mg | SUBCUTANEOUS | Status: DC
  Administered 2016-04-09: 40 mg via SUBCUTANEOUS
  Filled 2016-04-09: qty 0.4

## 2016-04-09 MED ORDER — INSULIN ASPART 100 UNIT/ML ~~LOC~~ SOLN
0.0000 [IU] | Freq: Three times a day (TID) | SUBCUTANEOUS | Status: DC
Start: 2016-04-09 — End: 2016-04-11
  Administered 2016-04-09: 1 [IU] via SUBCUTANEOUS
  Filled 2016-04-09: qty 1

## 2016-04-09 MED ORDER — LATANOPROST 0.005 % OP SOLN
1.0000 [drp] | Freq: Every day | OPHTHALMIC | Status: DC
  Administered 2016-04-09 – 2016-04-10 (×2): 1 [drp] via OPHTHALMIC
  Filled 2016-04-09: qty 2.5

## 2016-04-09 MED ORDER — ASPIRIN EC 81 MG PO TBEC
81.0000 mg | DELAYED_RELEASE_TABLET | Freq: Every day | ORAL | Status: DC
Start: 2016-04-09 — End: 2016-04-11
  Administered 2016-04-09 – 2016-04-11 (×3): 81 mg via ORAL
  Filled 2016-04-09 (×3): qty 1

## 2016-04-09 MED ORDER — POTASSIUM PHOSPHATES 15 MMOLE/5ML IV SOLN
10.0000 mmol | Freq: Once | INTRAVENOUS | Status: AC
  Administered 2016-04-09: 10 mmol via INTRAVENOUS
  Filled 2016-04-09: qty 3.33

## 2016-04-09 MED ORDER — IOPAMIDOL (ISOVUE-300) INJECTION 61%
INTRAVENOUS | Status: AC
  Administered 2016-04-09: 100 mL
  Filled 2016-04-09: qty 100

## 2016-04-09 MED ORDER — SODIUM CHLORIDE 0.9 % IV BOLUS (SEPSIS)
1000.0000 mL | Freq: Once | INTRAVENOUS | Status: AC
Start: 2016-04-09 — End: 2016-04-09
  Administered 2016-04-09: 1000 mL via INTRAVENOUS

## 2016-04-09 MED ORDER — DONEPEZIL HCL 5 MG PO TABS
5.0000 mg | ORAL_TABLET | Freq: Every day | ORAL | Status: DC
  Administered 2016-04-09 – 2016-04-10 (×2): 5 mg via ORAL
  Filled 2016-04-09 (×2): qty 1

## 2016-04-09 MED ORDER — MORPHINE SULFATE (PF) 4 MG/ML IV SOLN: 1.0000 mg | INTRAVENOUS | Status: DC | PRN

## 2016-04-09 MED ORDER — SODIUM CHLORIDE 0.9% FLUSH
3.0000 mL | Freq: Two times a day (BID) | INTRAVENOUS | Status: DC
Start: 2016-04-09 — End: 2016-04-11
  Administered 2016-04-10 – 2016-04-11 (×3): 3 mL via INTRAVENOUS

## 2016-04-09 MED ORDER — BRIMONIDINE TARTRATE 0.2 % OP SOLN
1.0000 [drp] | Freq: Three times a day (TID) | OPHTHALMIC | Status: DC
  Administered 2016-04-09 – 2016-04-11 (×6): 1 [drp] via OPHTHALMIC
  Filled 2016-04-09: qty 5

## 2016-04-09 MED ORDER — MAGNESIUM SULFATE 2 GM/50ML IV SOLN
2.0000 g | Freq: Once | INTRAVENOUS | Status: AC
  Administered 2016-04-09: 2 g via INTRAVENOUS
  Filled 2016-04-09: qty 50

## 2016-04-09 MED ORDER — POTASSIUM CHLORIDE IN NACL 20-0.9 MEQ/L-% IV SOLN
INTRAVENOUS | Status: AC
  Administered 2016-04-09: 11:00:00 via INTRAVENOUS
  Filled 2016-04-09 (×3): qty 1000

## 2016-04-09 MED ORDER — TRAZODONE HCL 50 MG PO TABS: 25.0000 mg | ORAL_TABLET | Freq: Every evening | ORAL | Status: DC | PRN

## 2016-04-09 MED ORDER — POLYVINYL ALCOHOL 1.4 % OP SOLN
1.0000 [drp] | Freq: Every day | OPHTHALMIC | Status: DC
  Administered 2016-04-09 – 2016-04-10 (×2): 1 [drp] via OPHTHALMIC
  Filled 2016-04-09: qty 15

## 2016-04-09 MED ORDER — CIPROFLOXACIN IN D5W 400 MG/200ML IV SOLN
400.0000 mg | Freq: Once | INTRAVENOUS | Status: AC
  Administered 2016-04-09: 400 mg via INTRAVENOUS
  Filled 2016-04-09: qty 200

## 2016-04-09 MED ORDER — RISAQUAD PO CAPS
1.0000 | ORAL_CAPSULE | Freq: Every day | ORAL | Status: DC
  Administered 2016-04-09 – 2016-04-11 (×3): 1 via ORAL
  Filled 2016-04-09 (×3): qty 1

## 2016-04-09 MED ORDER — ONDANSETRON HCL 4 MG/2ML IJ SOLN
4.0000 mg | Freq: Once | INTRAMUSCULAR | Status: AC | PRN
  Administered 2016-04-09: 4 mg via INTRAVENOUS
  Filled 2016-04-09: qty 2

## 2016-04-09 MED ORDER — METRONIDAZOLE IN NACL 5-0.79 MG/ML-% IV SOLN
500.0000 mg | Freq: Once | INTRAVENOUS | Status: AC
  Administered 2016-04-09: 500 mg via INTRAVENOUS
  Filled 2016-04-09: qty 100

## 2016-04-09 MED ORDER — ONDANSETRON HCL 4 MG/2ML IJ SOLN
4.0000 mg | Freq: Once | INTRAMUSCULAR | Status: AC | PRN
  Administered 2016-04-09: 4 mg via INTRAVENOUS

## 2016-04-09 NOTE — ED Notes (Signed)
Pt vomited yellow emesis and had yellow colored liquid BM

## 2016-04-09 NOTE — ED Notes (Signed)
Pt unable to stand for orthostatic v/s due to pt feeling so weak

## 2016-04-09 NOTE — ED Triage Notes (Signed)
Pt to ED by [facility] c/o syncope. Remembers walking into her kitchen, felt faint and was found lying on the floor by husband after 1.5 hours. Pt covered in feces, pale and ashen, on EMS arrival. Pt A&Ox4, reports feeling weak for the past few days with NV yesterday and diarrhea tonight

## 2016-04-09 NOTE — ED Notes (Signed)
Pt had an episode of emesis after attempting to drink water. MD aware and will contact admission team.

## 2016-04-09 NOTE — Consult Note (Signed)
CARDIOLOGY CONSULT NOTE  Patient ID: [name] MRN: [id number] DOB/AGE: 07-11-1945 71 y.o.  Admit date: 04/09/2016 Referring Physician  York Grice, PA-C Primary Physician:  [name], MD Reason for Consultation  Syncope  HPI: [name]  is a 71 y.o. female  With History of hypertension, hyperlipidemia and previously diabetic with the A1c of greater than 6.5  about 2 years ago, recent recurrent evaluation is revealed hyperglycemia with A1c less than 6.5%. She has chronic recurrent diarrhea, this morning admitted to the hospital with episode of syncope.  Patient was having nausea and vomiting that started yesterday. While she was in the bathroom she stood up having passed loose stools, felt extremely dizzy and had frank syncope. She is now being admitted for evaluation and management of the same along with diarrhea.  Agents husband was present the bedside also states that about 4-5 months ago she has had another episode of syncope, again associated with diarrhea.  Patient denies any chest pain, shortness of breath, symptoms suggestive of neurologic weakness or deficits, symptoms of claudication. There is no history of tobacco exposure.  Past Medical History:  Diagnosis Date  . Allergy   . Arthritis    right arm  . Cataract    bilateral and removal  . Environmental allergies   . Fecal incontinence    sphincter stim placed 07-2014  . GERD (gastroesophageal reflux disease)   . Glaucoma, both eyes   . History of adenomatous polyp of colon   . History of diverticulitis    duodenal 2012-- resolved  . History of hiatal hernia   . Hyperlipidemia   . Hypertension   . Osteopenia   . Sigmoid diverticulosis   . Status post dilation of esophageal narrowing   . Thyroid disease   . Type 2 diabetes mellitus ([place])   . Wears glasses      Past Surgical History:  Procedure Laterality Date  . ANAL RECTAL MANOMETRY N/A 03/20/2013  . BUNIONECTOMY Bilateral 1985  .  CARDIOVASCULAR STRESS TEST  02-13-2005   normal perfusion study/  no ischemia/  normal LV function and wall motion , ef 86%  . CARPAL TUNNEL RELEASE Left 08-05-2006  . CATARACT EXTRACTION    . COLONOSCOPY    . COLONOSCOPY W/ POLYPECTOMY  last one 02-13-2010  . ESOPHAGOGASTRODUODENOSCOPY (EGD) WITH ESOPHAGEAL DILATION  last one 09-11-2010  . KNEE ARTHROSCOPY Right 2013  . LAPAROSCOPIC NISSEN FUNDOPLICATION  [id number]  . peripheral neuro stimulator  07-2014   sphincter stimulator  . POLYPECTOMY    . RECTAL ULTRASOUND N/A 03/20/2013   nerve damage  . RETINAL DETACHMENT SURGERY  2008  . THYROIDECTOMY  1979   partial  . TONSILLECTOMY AND ADENOIDECTOMY  as child  . TRIGGER FINGER RELEASE  1999  . TUBAL LIGATION  1976  . VAGINAL HYSTERECTOMY  1983     Family History  Problem Relation Age of Onset  . Diabetes Mother   . Heart disease Mother     CABG 4 in 38  . Heart attack Mother   . Stroke Mother   . Cancer Father 66    liver  . Colon cancer Father   . Colon cancer Maternal Grandmother   . Pancreatic cancer Maternal Grandmother   . Stomach cancer Maternal Grandmother   . Colon polyps Neg Hx   . Esophageal cancer Neg Hx   . Rectal cancer Neg Hx      Social History: Social History   Social History  . Marital status:  Married    Spouse name: N/A  . Number of children: 2  . Years of education: N/A   Occupational History  . retired Retired   Social History Main Topics  . Smoking status: Never Smoker  . Smokeless tobacco: Never Used  . Alcohol use No  . Drug use: No  . Sexual activity: Not on file   Other Topics Concern  . Not on file   Social History Narrative   Married 1967. 2 children (daughters). 7 grandkids.       Retired-[employer] in [place]: reading      Daily caffeine     Prescriptions Prior to Admission  Medication Sig Dispense Refill Last Dose  . aspirin 81 MG tablet Take 81 mg by mouth daily.     04/08/2016 at Unknown time  .  atorvastatin (LIPITOR) 10 MG tablet TAKE ONE TABLET BY MOUTH ONCE DAILY 90 tablet 3 04/08/2016 at Unknown time  . brimonidine (ALPHAGAN) 0.2 % ophthalmic solution PLACE ONE DROP IN [facility] EYE THREE TIMES DAILY  2 04/08/2016 at Unknown time  . Calcium Carbonate-Vit D-Min (GNP CALCIUM 1200) 1200-1000 MG-UNIT CHEW Chew 1 tablet by mouth daily.     04/08/2016 at Unknown time  . Carboxymethylcellulose Sodium (LUBRICANT EYE DROPS OP) Place 1 drop into both eyes at bedtime.   04/08/2016 at Unknown time  . donepezil (ARICEPT) 5 MG tablet Take 1 tablet (5 mg total) by mouth at bedtime. 30 tablet 5 04/08/2016 at Unknown time  . glimepiride (AMARYL) 1 MG tablet TAKE ONE TABLET BY MOUTH ONCE DAILY BEFORE BREAKFAST. 90 tablet 2 04/08/2016 at Unknown time  . glucose blood (ONETOUCH VERIO) test strip Use to test blood sugars daily. Dx: E11.9 100 each 11 04/08/2016 at Unknown time  . latanoprost (XALATAN) 0.005 % ophthalmic solution Place 1 drop into both eyes at bedtime.    04/08/2016 at Unknown time  . Multiple Vitamins-Iron (MULTIVITAMIN/IRON) TABS Take 1 tablet by mouth daily.     04/08/2016 at Unknown time  . ONETOUCH DELICA LANCETS 99991111 MISC Use to check blood sugars daily. Dx: E11.9 100 each 11 04/08/2016 at Unknown time  . Probiotic Product (ALIGN) 4 MG CAPS Take 1 capsule by mouth daily.     04/08/2016 at Unknown time  . valsartan-hydrochlorothiazide (DIOVAN-HCT) 80-12.5 MG tablet Take 0.5 tablets by mouth daily. 90 tablet 2 04/08/2016 at Unknown time     ROS: General: no fevers/chills/night sweats Eyes: no blurry vision, diplopia, or amaurosis ENT: no sore throat or hearing loss Resp: no cough, wheezing, or hemoptysis CV: no edema or palpitations,  Syncope present GI: Frequent diarrhea, or constipation GU: no dysuria, frequency, or hematuria Skin: no rash Neuro: no headache, numbness, tingling, or weakness of extremities Musculoskeletal: no joint pain or swelling Heme: no bleeding, DVT, or easy bruising Endo: no  polydipsia or polyuria    Physical Exam: Blood pressure (!) 106/42, pulse 89, temperature 100 F (37.8 C), temperature source Oral, resp. rate 20, SpO2 98 %.   General appearance: alert, cooperative, appears stated age and no distress Lungs: clear to auscultation bilaterally Chest wall: no tenderness Heart: regular rate and rhythm, S1, S2 normal, no murmur, click, rub or gallop Abdomen: soft, non-tender; bowel sounds normal; no masses,  no organomegaly Extremities: extremities normal, atraumatic, no cyanosis or edema Pulses: 2+ and symmetric Neurologic: Grossly normal  Labs:   Lab Results  Component Value Date   WBC 14.6 (H) 04/09/2016   HGB 11.2 (L)  04/09/2016   HCT 33.3 (L) 04/09/2016   MCV 88.8 04/09/2016   PLT 252 04/09/2016    Recent Labs Lab 04/09/16 0252 04/09/16 1054  NA 140  --   K 3.3*  --   CL 102  --   CO2 26  --   BUN 17  --   CREATININE 0.90 0.91  CALCIUM 9.7  --   GLUCOSE 195*  --     Lipid Panel     Component Value Date/Time   CHOL 140 08/09/2015 0930   TRIG 141.0 08/09/2015 0930   HDL 62.60 08/09/2015 0930   CHOLHDL 2 08/09/2015 0930   VLDL 28.2 08/09/2015 0930   LDLCALC 49 08/09/2015 0930    HEMOGLOBIN A1C Lab Results  Component Value Date   HGBA1C 6.2 12/24/2015   MPG 134 (H) 07/13/2010    Recent Labs  08/09/15 0930 12/24/15 1531 04/09/16 1054  TSH 2.08 1.21 1.030   Lipid Panel     Component Value Date/Time   CHOL 140 08/09/2015 0930   TRIG 141.0 08/09/2015 0930   HDL 62.60 08/09/2015 0930   CHOLHDL 2 08/09/2015 0930   VLDL 28.2 08/09/2015 0930   LDLCALC 49 08/09/2015 0930   LDLDIRECT 44.4 03/23/2014 1017     Radiology: Ct Head Wo Contrast  Result Date: 04/09/2016 CLINICAL DATA:  Unwitnessed fall. EXAM: CT HEAD WITHOUT CONTRAST TECHNIQUE: Contiguous axial images were obtained from the base of the skull through the vertex without intravenous contrast. COMPARISON:  01/09/2016 FINDINGS: Brain: There is no intracranial  hemorrhage, mass or evidence of acute infarction. There is moderate generalized atrophy. There is moderate chronic microvascular ischemic change. There is no significant extra-axial fluid collection. No acute intracranial findings are evident. Vascular: No hyperdense vessel or unexpected calcification. Skull: Normal. Negative for fracture or focal lesion. Sinuses/Orbits: No acute finding. Other: None. IMPRESSION: No acute intracranial findings. There is moderate generalized atrophy and chronic appearing white matter hypodensities which likely represent small vessel ischemic disease. Electronically Signed   By: Andreas Newport M.D.   On: 04/09/2016 05:56   Ct Abdomen Pelvis W Contrast  Result Date: 04/09/2016 CLINICAL DATA:  Unwitnessed fall.  Found on floor. EXAM: CT ABDOMEN AND PELVIS WITH CONTRAST TECHNIQUE: Multidetector CT imaging of the abdomen and pelvis was performed using the standard protocol following bolus administration of intravenous contrast. CONTRAST:  144mL ISOVUE-300 IOPAMIDOL (ISOVUE-300) INJECTION 61% COMPARISON:  11/22/2007 FINDINGS: Lower chest: No acute abnormality. Hepatobiliary: No focal liver abnormality is seen. No gallstones, gallbladder wall thickening, or biliary dilatation. Pancreas: Unremarkable. No pancreatic ductal dilatation or surrounding inflammatory changes. Spleen: Normal in size without focal abnormality. Adrenals/Urinary Tract: Adrenal glands are unremarkable. Kidneys are normal, without renal calculi, focal lesion, or hydronephrosis. Bladder is unremarkable. Stomach/Bowel: Stomach is within normal limits. Colon appears normal. No evidence of bowel wall thickening, distention, or inflammatory changes. Vascular/Lymphatic: The abdominal aorta is normal in caliber with mild atherosclerotic calcification. No adenopathy in the abdomen or pelvis. Reproductive: Status post hysterectomy. No adnexal masses. Other: No inflammatory changes.  No ascites. Musculoskeletal: No  significant skeletal lesions. IMPRESSION: No significant abnormality. Electronically Signed   By: Andreas Newport M.D.   On: 04/09/2016 06:15   Dg Chest Port 1 View  Result Date: 04/09/2016 CLINICAL DATA:  Nausea, vomiting EXAM: PORTABLE CHEST 1 VIEW COMPARISON:  None. FINDINGS: Heart is normal size. No confluent airspace opacities or effusions. No acute bony abnormality. IMPRESSION: No active disease. Electronically Signed   By: Rolm Baptise M.D.  On: 04/09/2016 14:13    Scheduled Meds: . acidophilus  1 capsule Oral Daily  . aspirin EC  81 mg Oral Daily  . brimonidine  1 drop Both Eyes TID  . donepezil  5 mg Oral QHS  . enoxaparin (LOVENOX) injection  40 mg Subcutaneous Q24H  . insulin aspart  0-9 Units Subcutaneous TID WC  . latanoprost  1 drop Both Eyes QHS  . magnesium sulfate 1 - 4 g bolus IVPB  2 g Intravenous Once  . polyvinyl alcohol  1 drop Both Eyes QHS  . sodium chloride flush  3 mL Intravenous Q12H   Continuous Infusions: . 0.9 % NaCl with KCl 20 mEq / L 100 mL/hr at 04/09/16 1128   PRN Meds:.morphine injection, promethazine, traZODone  EKG 04/09/2016: Sinus rhythm with one episode of Mobitz 2 AV block, normal axis, no evidence of ischemia. Normal QT interval.  ASSESSMENT AND PLAN:  1. Syncope secondary to dehydration and probably vasovagal. Patient was orthostatic when I examined her, blood pressure dropped by about 20 points when she sat. 2. Mobitz type II AV block, asymptomatic, not convinced that she has heart block that can explain syncope. 3. Hypertension 4. Hyperlipidemia 5. Hyperglycemia without diabetes mellitus. 6. Bilateral soft carotid artery bruit 7. Frequent loose stools, chronic.  Recommendation: Patient's syncope is unrelated to one episode of Mobitz type II AV block. She will probably need an event monitor to exclude high degree AV block on a recurrent pattern. This can be done in the outpatient basis. I suspect her chronic diarrhea is contributing to  her frequent episodes of dizzy spells, fatigue and today's presentation.  She does not have diabetes mellitus, has hyperglycemia. She does have mild carotid artery soft bruit bilaterally, will need carotid artery duplex. Again this does not explain syncope. Incidental finding. Unless carotid artery duplex is markedly abnormal, I will see her back on a when necessary basis. I be happy to arrange for an event monitor upon discharge.  Adrian Prows, MD 04/09/2016, 3:29 PM [facility] [place] Pager: [phone] Office: [phone] If no answer Cell [phone]

## 2016-04-09 NOTE — ED Notes (Signed)
CBG 185 

## 2016-04-09 NOTE — ED Notes (Signed)
Admitting at bedside 

## 2016-04-09 NOTE — ED Notes (Signed)
Pt returned from CT. Pt had additional bowel movement, yellow in color.

## 2016-04-09 NOTE — ED Notes (Signed)
Pt c/o nausea.  

## 2016-04-09 NOTE — ED Notes (Signed)
Water given to pt by Dr.Nanavati

## 2016-04-09 NOTE — H&P (Signed)
History and Physical    [name] [id number] DOB: 07-10-1945 DOA: 04/09/2016  PCP: [name], MD Patient coming from: home  Chief Complaint: syncope, intractable nausea, vomiting, abdominal pain, diarrhea  HPI: [name] is a 71 y.o. female with medical history significant of HTN, hyperlipidemia and DM, who present\eds to the Emergency Department after a syncopal episode which occurred prior to arrival.  Pt stated she felt lightheaded and nauseated prior to syncope and had diarrhea 3 bowel movements at home.  Patient has a history of bowel incontinence and is s/p sphincter stimulation implantation. She might have an episode of diarrhea one a wee or so, but since yesterday she has been having multiple liquid BM that is not usual for her  ED Course: On presentation to the emergency department patient was febrile, vital signs were stable. her symptoms continued in the ED, she had nausea, vomiting, not responsive to 4 rounds of IV Zofran and she had copious amount of diarrhea. Blood work showed elevated white blood cells count to 17,300, hypokalemia with potassium 3.3, abdominal CT was negative for any acute findings Patient received 1 L of normal saline and has given an oral challenge with by mouth liquids but couldn't tolerate it due to intractable nausea and vomiting  She denied palpitations, SOB, chest pain and diaphoresis prior to syncope, reported a history of a syncopal episode about 3 months ago that occurred in the bathroom and patient found herself lying on the bathroom floor. She also c/o frontal HA, runny nose, large amount of PND irritating her pharynx, cough and sneezing  Review of Systems: As per HPI otherwise 10 point review of systems negative.   Ambulatory Status: Independent  Past Medical History:  Diagnosis Date  . Allergy   . Arthritis    right arm  . Cataract    bilateral and removal  . Environmental allergies   . Fecal incontinence    sphincter stim placed 07-2014  . GERD (gastroesophageal reflux disease)   . Glaucoma, both eyes   . History of adenomatous polyp of colon   . History of diverticulitis    duodenal 2012-- resolved  . History of hiatal hernia   . Hyperlipidemia   . Hypertension   . Osteopenia   . Sigmoid diverticulosis   . Status post dilation of esophageal narrowing   . Thyroid disease   . Type 2 diabetes mellitus ([place])   . Wears glasses     Past Surgical History:  Procedure Laterality Date  . ANAL RECTAL MANOMETRY N/A 03/20/2013  . BUNIONECTOMY Bilateral 1985  . CARDIOVASCULAR STRESS TEST  02-13-2005   normal perfusion study/  no ischemia/  normal LV function and wall motion , ef 86%  . CARPAL TUNNEL RELEASE Left 08-05-2006  . CATARACT EXTRACTION    . COLONOSCOPY    . COLONOSCOPY W/ POLYPECTOMY  last one 02-13-2010  . ESOPHAGOGASTRODUODENOSCOPY (EGD) WITH ESOPHAGEAL DILATION  last one 09-11-2010  . KNEE ARTHROSCOPY Right 2013  . LAPAROSCOPIC NISSEN FUNDOPLICATION  [id number]  . peripheral neuro stimulator  07-2014   sphincter stimulator  . POLYPECTOMY    . RECTAL ULTRASOUND N/A 03/20/2013   nerve damage  . RETINAL DETACHMENT SURGERY  2008  . THYROIDECTOMY  1979   partial  . TONSILLECTOMY AND ADENOIDECTOMY  as child  . TRIGGER FINGER RELEASE  1999  . TUBAL LIGATION  1976  . VAGINAL HYSTERECTOMY  1983    Social History   Social History  . Marital status: Married  Spouse name: N/A  . Number of children: 2  . Years of education: N/A   Occupational History  . retired Retired   Social History Main Topics  . Smoking status: Never Smoker  . Smokeless tobacco: Never Used  . Alcohol use No  . Drug use: No  . Sexual activity: Not on file   Other Topics Concern  . Not on file   Social History Narrative   Married 1967. 2 children (daughters). 7 grandkids.       Retired-[employer] in [place]: reading      Daily caffeine    Allergies  Allergen Reactions  .  Lisinopril Diarrhea and Other (See Comments)    REACTION: hallucinations/diarrhea  . Metformin And Related Diarrhea    Family History  Problem Relation Age of Onset  . Diabetes Mother   . Heart disease Mother     CABG 50 in 10  . Heart attack Mother   . Stroke Mother   . Cancer Father 75    liver  . Colon cancer Father   . Colon cancer Maternal Grandmother   . Pancreatic cancer Maternal Grandmother   . Stomach cancer Maternal Grandmother   . Colon polyps Neg Hx   . Esophageal cancer Neg Hx   . Rectal cancer Neg Hx     Prior to Admission medications   Medication Sig Start Date End Date Taking? Authorizing Provider  aspirin 81 MG tablet Take 81 mg by mouth daily.      Historical Provider, MD  atorvastatin (LIPITOR) 10 MG tablet TAKE ONE TABLET BY MOUTH ONCE DAILY 11/04/15   Marin Olp, MD  brimonidine (ALPHAGAN) 0.2 % ophthalmic solution PLACE ONE DROP IN [facility] EYE THREE TIMES DAILY 04/09/15   Historical Provider, MD  Calcium Carbonate-Vit D-Min (GNP CALCIUM 1200) 1200-1000 MG-UNIT CHEW Chew 1 tablet by mouth daily.      Historical Provider, MD  donepezil (ARICEPT) 5 MG tablet Take 1 tablet (5 mg total) by mouth at bedtime. 12/24/15   Marin Olp, MD  glimepiride (AMARYL) 1 MG tablet TAKE ONE TABLET BY MOUTH ONCE DAILY BEFORE BREAKFAST. 03/02/16   Marin Olp, MD  glucose blood (ONETOUCH VERIO) test strip Use to test blood sugars daily. Dx: E11.9 04/18/15   Marin Olp, MD  latanoprost (XALATAN) 0.005 % ophthalmic solution Place 1 drop into both eyes at bedtime.     Historical Provider, MD  Multiple Vitamins-Iron (MULTIVITAMIN/IRON) TABS Take 1 tablet by mouth daily.      Historical Provider, MD  [facility] DELICA LANCETS 99991111 MISC Use to check blood sugars daily. Dx: E11.9 04/18/15   Marin Olp, MD  Probiotic Product (ALIGN) 4 MG CAPS Take 1 capsule by mouth daily.      Historical Provider, MD  valsartan-hydrochlorothiazide (DIOVAN-HCT) 80-12.5 MG tablet Take 0.5  tablets by mouth daily. 04/18/15   Marin Olp, MD    Physical Exam: Vitals:   04/09/16 0500 04/09/16 0630 04/09/16 0800 04/09/16 0931  BP: (!) 135/48 (!) 115/43 (!) 115/41 94/66  Pulse: 102 99 98 93  Resp: 25 25 23 21   Temp:    99.7 F (37.6 C)  TempSrc:    Oral  SpO2: 92% 100% 98% 99%     General: Appears calm and comfortable Eyes: PERRLA, EOMI, normal lids, iris ENT:  grossly normal hearing, lips & tongue, mucous membranes moist and intact, erythematous, skin around the nares irritated, scabbed lesion near the right  nostril Neck: no lymphoadenopathy, masses or thyromegaly Cardiovascular: RRR, no m/r/g. No JVD, carotid bruits. No LE edema.  Respiratory: bilateral no wheezes, rales, rhonchi or cracles. Normal respiratory effort. No accessory muscle use observed Abdomen: soft, diffusely tender, non-distended, no organomegaly or masses appreciated. BS present in all quadrants Skin: no rash, ulcers or induration seen on limited exam Musculoskeletal: grossly normal tone BUE/BLE, good ROM, no bony abnormality or joint deformities observed Psychiatric: grossly normal mood and affect, speech fluent and appropriate, alert and oriented x3 Neurologic: CN II-XII grossly intact, moves all extremities in coordinated fashion, sensation intact  Labs on Admission: I have personally reviewed following labs and imaging studies  CBC, BMP  GFR: CrCl cannot be calculated (Unknown ideal weight.).   Creatinine Clearance: CrCl cannot be calculated (Unknown ideal weight.).    Radiological Exams on Admission: Ct Head Wo Contrast  Result Date: 04/09/2016 CLINICAL DATA:  Unwitnessed fall. EXAM: CT HEAD WITHOUT CONTRAST TECHNIQUE: Contiguous axial images were obtained from the base of the skull through the vertex without intravenous contrast. COMPARISON:  01/09/2016 FINDINGS: Brain: There is no intracranial hemorrhage, mass or evidence of acute infarction. There is moderate generalized atrophy.  There is moderate chronic microvascular ischemic change. There is no significant extra-axial fluid collection. No acute intracranial findings are evident. Vascular: No hyperdense vessel or unexpected calcification. Skull: Normal. Negative for fracture or focal lesion. Sinuses/Orbits: No acute finding. Other: None. IMPRESSION: No acute intracranial findings. There is moderate generalized atrophy and chronic appearing white matter hypodensities which likely represent small vessel ischemic disease. Electronically Signed   By: Andreas Newport M.D.   On: 04/09/2016 05:56   Ct Abdomen Pelvis W Contrast  Result Date: 04/09/2016 CLINICAL DATA:  Unwitnessed fall.  Found on floor. EXAM: CT ABDOMEN AND PELVIS WITH CONTRAST TECHNIQUE: Multidetector CT imaging of the abdomen and pelvis was performed using the standard protocol following bolus administration of intravenous contrast. CONTRAST:  177mL ISOVUE-300 IOPAMIDOL (ISOVUE-300) INJECTION 61% COMPARISON:  11/22/2007 FINDINGS: Lower chest: No acute abnormality. Hepatobiliary: No focal liver abnormality is seen. No gallstones, gallbladder wall thickening, or biliary dilatation. Pancreas: Unremarkable. No pancreatic ductal dilatation or surrounding inflammatory changes. Spleen: Normal in size without focal abnormality. Adrenals/Urinary Tract: Adrenal glands are unremarkable. Kidneys are normal, without renal calculi, focal lesion, or hydronephrosis. Bladder is unremarkable. Stomach/Bowel: Stomach is within normal limits. Colon appears normal. No evidence of bowel wall thickening, distention, or inflammatory changes. Vascular/Lymphatic: The abdominal aorta is normal in caliber with mild atherosclerotic calcification. No adenopathy in the abdomen or pelvis. Reproductive: Status post hysterectomy. No adnexal masses. Other: No inflammatory changes.  No ascites. Musculoskeletal: No significant skeletal lesions. IMPRESSION: No significant abnormality. Electronically Signed   By:  Andreas Newport M.D.   On: 04/09/2016 06:15    EKG: Independently reviewed - Second degree AVB, Mobitz II type  Assessment/Plan Principal Problem:   Syncope Active Problems:   Hyperlipidemia   Essential hypertension   Diabetes mellitus type II, controlled (HCC)   Fecal incontinence   Diarrhea   Abdominal pain with vomiting   Syncope - unclear etiology, could be associated with some degree of dehydration and viral illness No previous history of CAD or arrhythmia EKG showed conduction block, will ask cardiology to see patient Will order Echo, check orthostatic vital signs, monitor rhythm on telemetry  Abdominal pain, nausea , vomiting, diarrhea  C.diff is negative, abdominal CT without acute findings Will give Phenergan for nausea control as Zofran was not really helping Morphine IV for pain control  GI panel ordered  URI - possible viral, sinusitis cannot be excluded Check respiratory viral and influenza panel, chest XRay Monitor WBC's count and fever  DM type II - most recent HgbA1C is 6.2% on 12/24/15 Hold Amaryl while hospitalized Continue diabetic diet when tolerates po, monitor FSBS QACHS, add sliding scale insulin  Hypertension - currently borderline hypotensive Hold Diovan/HCTZ, continue to monitor BP  Hyperlipidemia Hold statin now and restart when patient can tolerate oral intake  Dementia, mild Continue Aricept Provide supportive care  Hypokalemia Replace and recheck Check magnesium level  DVT prophylaxis: Lovenox Code Status: Full Family Communication: husband and daughter Disposition Plan: telemetry Consults called: cardiology Admission status: observation   Caprice Red Pager: [phone] [facility]  If 7PM-7AM, please contact night-coverage [email or web address] Password [email or web address]  04/09/2016, 9:40 AM

## 2016-04-09 NOTE — ED Provider Notes (Signed)
[name] Note   CSN: [id number] Arrival date & time: 04/09/16  0207   By signing my name below, I, [name], attest that this documentation has been prepared under the direction and in the presence of Meghan Biles, MD  Electronically Signed: Delton Gray, ED Scribe. 04/09/16. 2:40 AM.   History   Chief Complaint Chief Complaint  Patient presents with  . Loss of Consciousness   The history is provided by the patient. No language interpreter was used.   HPI Comments:  [name] is a 71 y.o. female, with a hx of HTN, hyperlipidemia and DM, who presents to the Emergency Department s/p a syncopal episode which occurred prior to arrival. Pt states she felt lightheaded and nausea prior to syncope. Pt also reports diarrhea (3 bowel movements today). She denies palpitations, SOB, chest pain and diaphoresis prior to syncope. No alleviating factors noted. Pt denies vomiting, hx of heart problems, hx of cancer, hx of blood clots, recent surgeries, estrogen supplement use, recent antibiotic use, recent long distance travel, any other associated symptoms and any other modifying factors at this time. Pt reports a hx of a syncopal episode about 3 months ago and was evaluated at her medical clinic.  PCP: [name], MD  Past Medical History:  Diagnosis Date  . Allergy   . Arthritis    right arm  . Cataract    bilateral and removal  . Environmental allergies   . Fecal incontinence    sphincter stim placed 07-2014  . GERD (gastroesophageal reflux disease)   . Glaucoma, both eyes   . History of adenomatous polyp of colon   . History of diverticulitis    duodenal 2012-- resolved  . History of hiatal hernia   . Hyperlipidemia   . Hypertension   . Osteopenia   . Sigmoid diverticulosis   . Status post dilation of esophageal narrowing   . Thyroid disease   . Type 2 diabetes mellitus ([place])   . Wears glasses     Patient Active Problem List   Diagnosis  Date Noted  . Dehydration   . Syncope and collapse 04/09/2016  . Abdominal pain with vomiting 04/09/2016  . History of adenomatous polyp of colon 08/09/2015  . Mild cognitive impairment 08/09/2015  . Diarrhea 05/20/2015  . Glaucoma 03/23/2014  . H/O partial thyroidectomy 03/23/2014  . Fecal incontinence 03/23/2014  . Family history of malignant neoplasm of gastrointestinal tract 08/26/2010  . Anemia 08/26/2010  . Esophageal stricture 07/16/2010  . Osteopenia 04/22/2007  . Hyperlipidemia 10/19/2006  . Essential hypertension 10/19/2006  . Diabetes mellitus type II, controlled ([place]) 10/19/2006    Past Surgical History:  Procedure Laterality Date  . ANAL RECTAL MANOMETRY N/A 03/20/2013  . BUNIONECTOMY Bilateral 1985  . CARDIOVASCULAR STRESS TEST  02-13-2005   normal perfusion study/  no ischemia/  normal LV function and wall motion , ef 86%  . CARPAL TUNNEL RELEASE Left 08-05-2006  . CATARACT EXTRACTION    . COLONOSCOPY    . COLONOSCOPY W/ POLYPECTOMY  last one 02-13-2010  . ESOPHAGOGASTRODUODENOSCOPY (EGD) WITH ESOPHAGEAL DILATION  last one 09-11-2010  . KNEE ARTHROSCOPY Right 2013  . LAPAROSCOPIC NISSEN FUNDOPLICATION  [id number]  . peripheral neuro stimulator  07-2014   sphincter stimulator  . POLYPECTOMY    . RECTAL ULTRASOUND N/A 03/20/2013   nerve damage  . RETINAL DETACHMENT SURGERY  2008  . THYROIDECTOMY  1979   partial  . TONSILLECTOMY AND ADENOIDECTOMY  as child  . TRIGGER  FINGER RELEASE  1999  . TUBAL LIGATION  1976  . VAGINAL HYSTERECTOMY  1983    OB History    No data available       Home Medications    Prior to Admission medications   Medication Sig Start Date End Date Taking? Authorizing Provider  aspirin 81 MG tablet Take 81 mg by mouth daily.     Yes Historical Provider, MD  atorvastatin (LIPITOR) 10 MG tablet TAKE ONE TABLET BY MOUTH ONCE DAILY 11/04/15  Yes Marin Olp, MD  brimonidine (ALPHAGAN) 0.2 % ophthalmic solution PLACE ONE DROP IN [facility]  EYE THREE TIMES DAILY 04/09/15  Yes Historical Provider, MD  Calcium Carbonate-Vit D-Min (GNP CALCIUM 1200) 1200-1000 MG-UNIT CHEW Chew 1 tablet by mouth daily.     Yes Historical Provider, MD  Carboxymethylcellulose Sodium (LUBRICANT EYE DROPS OP) Place 1 drop into both eyes at bedtime.   Yes Historical Provider, MD  donepezil (ARICEPT) 5 MG tablet Take 1 tablet (5 mg total) by mouth at bedtime. 12/24/15  Yes Marin Olp, MD  glimepiride (AMARYL) 1 MG tablet TAKE ONE TABLET BY MOUTH ONCE DAILY BEFORE BREAKFAST. 03/02/16  Yes Marin Olp, MD  glucose blood (ONETOUCH VERIO) test strip Use to test blood sugars daily. Dx: E11.9 04/18/15  Yes Marin Olp, MD  latanoprost (XALATAN) 0.005 % ophthalmic solution Place 1 drop into both eyes at bedtime.    Yes Historical Provider, MD  Multiple Vitamins-Iron (MULTIVITAMIN/IRON) TABS Take 1 tablet by mouth daily.     Yes Historical Provider, MD  [facility] DELICA LANCETS [id number] MISC Use to check blood sugars daily. Dx: E11.9 04/18/15  Yes Marin Olp, MD  Probiotic Product (ALIGN) 4 MG CAPS Take 1 capsule by mouth daily.     Yes Historical Provider, MD  valsartan-hydrochlorothiazide (DIOVAN-HCT) 80-12.5 MG tablet Take 0.5 tablets by mouth daily. 04/18/15  Yes Marin Olp, MD  loperamide (IMODIUM) 2 MG capsule Take 1 capsule (2 mg total) by mouth as needed for diarrhea or loose stools. 04/10/16   Fisher Hargadon Arsenio Loader, MD  promethazine (PHENERGAN) 12.5 MG tablet Take 1 tablet (12.5 mg total) by mouth every 6 (six) hours as needed for nausea. 04/10/16   Emarie Paul Arsenio Loader, MD    Family History Family History  Problem Relation Age of Onset  . Diabetes Mother   . Heart disease Mother     CABG 63 in 32  . Heart attack Mother   . Stroke Mother   . Cancer Father 49    liver  . Colon cancer Father   . Colon cancer Maternal Grandmother   . Pancreatic cancer Maternal Grandmother   . Stomach cancer Maternal Grandmother   . Colon polyps Neg Hx   .  Esophageal cancer Neg Hx   . Rectal cancer Neg Hx     Social History Social History  Substance Use Topics  . Smoking status: Never Smoker  . Smokeless tobacco: Never Used  . Alcohol use No     Allergies   Lisinopril and Metformin and related   Review of Systems Review of Systems 10 systems reviewed and all are negative for acute change except as noted in the HPI.  Physical Exam Updated Vital Signs BP (!) 125/48 (BP Location: Right Arm)   Pulse 73   Temp 98.3 F (36.8 C) (Oral)   Resp 18   Ht 5\' 2"  (1.575 m)   Wt 115 lb 4.8 oz (52.3 kg)   SpO2 99%  BMI 21.09 kg/m   Physical Exam  Constitutional: She is oriented to person, place, and time. She appears well-developed and well-nourished.  HENT:  Head: Normocephalic.  Eyes: EOM are normal.  Neck: Normal range of motion.  Cardiovascular: Normal rate and regular rhythm.   No murmur heard. Pulmonary/Chest: Effort normal.  Anterior lungs clear  Abdominal: Soft. She exhibits no distension. There is tenderness.  No palpable mass. Mild generalized tenderness.   Musculoskeletal: Normal range of motion.  Neurological: She is alert and oriented to person, place, and time.  Psychiatric: She has a normal mood and affect.  Nursing note and vitals reviewed.   ED Treatments / Results  DIAGNOSTIC STUDIES:  Oxygen Saturation is 98% on RA, normal by my interpretation.    COORDINATION OF CARE:  2:33 AM Discussed treatment plan with pt at bedside and pt agreed to plan.  Labs (all labs ordered are listed, but only abnormal results are displayed) Labs Reviewed  GASTROINTESTINAL PANEL BY PCR, STOOL (REPLACES STOOL CULTURE) - Abnormal; Notable for the following:       Result Value   Norovirus GI/GII DETECTED (*)    All other components within normal limits  BASIC METABOLIC PANEL - Abnormal; Notable for the following:    Potassium 3.3 (*)    Glucose, Bld 195 (*)    All other components within normal limits  CBC - Abnormal;  Notable for the following:    WBC 17.3 (*)    All other components within normal limits  URINALYSIS, ROUTINE W REFLEX MICROSCOPIC - Abnormal; Notable for the following:    Specific Gravity, Urine 1.045 (*)    All other components within normal limits  MAGNESIUM - Abnormal; Notable for the following:    Magnesium 1.5 (*)    All other components within normal limits  CBC - Abnormal; Notable for the following:    WBC 14.6 (*)    RBC 3.75 (*)    Hemoglobin 11.2 (*)    HCT 33.3 (*)    All other components within normal limits  PHOSPHORUS - Abnormal; Notable for the following:    Phosphorus 1.6 (*)    All other components within normal limits  COMPREHENSIVE METABOLIC PANEL - Abnormal; Notable for the following:    Potassium 3.4 (*)    Calcium 7.5 (*)    Total Protein 4.8 (*)    Albumin 2.7 (*)    Alkaline Phosphatase 29 (*)    Anion gap 4 (*)    All other components within normal limits  CBC - Abnormal; Notable for the following:    RBC 3.49 (*)    Hemoglobin 10.3 (*)    HCT 31.1 (*)    All other components within normal limits  GLUCOSE, CAPILLARY - Abnormal; Notable for the following:    Glucose-Capillary 110 (*)    All other components within normal limits  GLUCOSE, CAPILLARY - Abnormal; Notable for the following:    Glucose-Capillary 103 (*)    All other components within normal limits  BASIC METABOLIC PANEL - Abnormal; Notable for the following:    Calcium 8.1 (*)    All other components within normal limits  GLUCOSE, CAPILLARY - Abnormal; Notable for the following:    Glucose-Capillary 104 (*)    All other components within normal limits  CBG MONITORING, ED - Abnormal; Notable for the following:    Glucose-Capillary 185 (*)    All other components within normal limits  CBG MONITORING, ED - Abnormal; Notable for the following:  Glucose-Capillary 133 (*)    All other components within normal limits  C DIFFICILE QUICK SCREEN W PCR REFLEX  RESPIRATORY PANEL BY PCR    LIPASE, BLOOD  INFLUENZA PANEL BY PCR (TYPE A & B, H1N1)  TSH  CREATININE, SERUM  GLUCOSE, CAPILLARY  GLUCOSE, CAPILLARY  GLUCOSE, CAPILLARY  GLUCOSE, CAPILLARY  GLUCOSE, CAPILLARY  MAGNESIUM  I-STAT CG4 LACTIC ACID, ED    EKG  EKG Interpretation  Date/Time:  Thursday April 09 2016 02:15:11 EST Ventricular Rate:  93 PR Interval:    QRS Duration: 81 QT Interval:  360 QTC Calculation: 448 R Axis:   10 Text Interpretation:  Second degree AV block, Mobitz II Low voltage, precordial leads No acute changes No significant change since last tracing Confirmed by Kathrynn Humble, MD, Thelma Comp [id number]) on 04/09/2016 5:13:27 AM Also confirmed by Kathrynn Humble, MD, Thelma Comp [phone]), editor [place]  CCT, [place] ([id number])  on 04/09/2016 7:27:22 AM       Radiology No results found.  Procedures Procedures (including critical care time)  Medications Ordered in ED Medications  0.9 % NaCl with KCl 20 mEq/ L  infusion ( Intravenous Stopped 04/10/16 0508)  ondansetron (ZOFRAN) injection 4 mg (4 mg Intravenous Given 04/09/16 0223)  ondansetron (ZOFRAN) injection 4 mg (4 mg Intravenous Given 04/09/16 0346)  sodium chloride 0.9 % bolus 1,000 mL (0 mLs Intravenous Stopped 04/09/16 0749)  ondansetron (ZOFRAN) injection 4 mg (4 mg Intravenous Given 04/09/16 0520)  ciprofloxacin (CIPRO) IVPB 400 mg (0 mg Intravenous Stopped 04/09/16 0748)  metroNIDAZOLE (FLAGYL) IVPB 500 mg (0 mg Intravenous Stopped 04/09/16 0749)  iopamidol (ISOVUE-300) 61 % injection (100 mLs  Contrast Given 04/09/16 0529)  ondansetron (ZOFRAN) injection 4 mg (4 mg Intravenous Given 04/09/16 0828)  sodium chloride 0.9 % bolus 1,000 mL (0 mLs Intravenous Stopped 04/09/16 0947)  magnesium sulfate IVPB 2 g 50 mL (2 g Intravenous Given 04/09/16 1554)  potassium phosphate 10 mmol in dextrose 5 % 250 mL infusion (10 mmol Intravenous Given 04/09/16 1811)  potassium phosphate 10 mmol in dextrose 5 % 250 mL infusion (10 mmol Intravenous Given 04/10/16 1415)  magnesium sulfate  IVPB 2 g 50 mL (2 g Intravenous Given 04/10/16 1256)  potassium chloride SA (K-DUR,KLOR-CON) CR tablet 40 mEq (40 mEq Oral Given 04/10/16 1256)     Initial Impression / Assessment and Plan / ED Course  I have reviewed the triage vital signs and the nursing notes.  Pertinent labs & imaging results that were available during my care of the patient were reviewed by me and considered in my medical decision making (see chart for details).  Clinical Course as of Apr 14 2343  Thu Apr 09, 2016  0801 Pt failed po challenge. Still tachycardic. Feels weak. We will admit for syncope and intractable n/v.  [AN]    Clinical Course User Index [AN] Meghan Biles, MD    I personally performed the services described in this documentation, which was scribed in my presence. The recorded information has been reviewed and is accurate.    Final Clinical Impressions(s) / ED Diagnoses   Final diagnoses:  Syncope and collapse  Dehydration  Intractable vomiting with nausea, unspecified vomiting type  Nausea & vomiting    New Prescriptions Discharge Medication List as of 04/11/2016  1:16 PM    START taking these medications   Details  loperamide (IMODIUM) 2 MG capsule Take 1 capsule (2 mg total) by mouth as needed for diarrhea or loose stools., Starting Fri 04/10/2016, Normal    promethazine (  PHENERGAN) 12.5 MG tablet Take 1 tablet (12.5 mg total) by mouth every 6 (six) hours as needed for nausea., Starting Fri 04/10/2016, Normal          Meghan Biles, MD 04/13/16 2345

## 2016-04-09 NOTE — ED Notes (Signed)
Pt placed on zoll pads per admitting MD request.

## 2016-04-10 ENCOUNTER — Observation Stay (HOSPITAL_BASED_OUTPATIENT_CLINIC_OR_DEPARTMENT_OTHER): Payer: Medicare Other

## 2016-04-10 DIAGNOSIS — R197 Diarrhea, unspecified: Secondary | ICD-10-CM

## 2016-04-10 DIAGNOSIS — I10 Essential (primary) hypertension: Secondary | ICD-10-CM | POA: Diagnosis not present

## 2016-04-10 DIAGNOSIS — E119 Type 2 diabetes mellitus without complications: Secondary | ICD-10-CM | POA: Diagnosis not present

## 2016-04-10 DIAGNOSIS — E860 Dehydration: Secondary | ICD-10-CM

## 2016-04-10 DIAGNOSIS — R0989 Other specified symptoms and signs involving the circulatory and respiratory systems: Secondary | ICD-10-CM

## 2016-04-10 DIAGNOSIS — R55 Syncope and collapse: Secondary | ICD-10-CM | POA: Diagnosis not present

## 2016-04-10 LAB — COMPREHENSIVE METABOLIC PANEL
ALT: 19 U/L (ref 14–54)
ANION GAP: 4 — AB (ref 5–15)
AST: 23 U/L (ref 15–41)
Albumin: 2.7 g/dL — ABNORMAL LOW (ref 3.5–5.0)
Alkaline Phosphatase: 29 U/L — ABNORMAL LOW (ref 38–126)
BUN: 9 mg/dL (ref 6–20)
CO2: 25 mmol/L (ref 22–32)
Calcium: 7.5 mg/dL — ABNORMAL LOW (ref 8.9–10.3)
Chloride: 111 mmol/L (ref 101–111)
Creatinine, Ser: 0.81 mg/dL (ref 0.44–1.00)
GFR calc Af Amer: >60 mL/min (ref >60)
GFR calc non Af Amer: >60 mL/min (ref >60)
GLUCOSE: 90 mg/dL (ref 65–99)
POTASSIUM: 3.4 mmol/L — AB (ref 3.5–5.1)
SODIUM: 140 mmol/L (ref 135–145)
Total Bilirubin: 0.5 mg/dL (ref 0.3–1.2)
Total Protein: 4.8 g/dL — ABNORMAL LOW (ref 6.5–8.1)

## 2016-04-10 LAB — VAS US CAROTID
LCCADSYS: 92 cm/s
LCCAPDIAS: 28 cm/s
LEFT ECA DIAS: -12 cm/s
LEFT VERTEBRAL DIAS: 20 cm/s
LICADDIAS: -43 cm/s
LICAPDIAS: -19 cm/s
Left CCA dist dias: 23 cm/s
Left CCA prox sys: 140 cm/s
Left ICA dist sys: -131 cm/s
Left ICA prox sys: -88 cm/s
RCCAPDIAS: 14 cm/s
RCCAPSYS: 108 cm/s
RIGHT ECA DIAS: -10 cm/s
RIGHT VERTEBRAL DIAS: 15 cm/s
Right cca dist sys: -86 cm/s

## 2016-04-10 LAB — CBC
HEMATOCRIT: 31.1 % — AB (ref 36.0–46.0)
HEMOGLOBIN: 10.3 g/dL — AB (ref 12.0–15.0)
MCH: 29.5 pg (ref 26.0–34.0)
MCHC: 33.1 g/dL (ref 30.0–36.0)
MCV: 89.1 fL (ref 78.0–100.0)
Platelets: 226 10*3/uL (ref 150–400)
RBC: 3.49 MIL/uL — ABNORMAL LOW (ref 3.87–5.11)
RDW: 13.1 % (ref 11.5–15.5)
WBC: 6.6 10*3/uL (ref 4.0–10.5)

## 2016-04-10 LAB — GLUCOSE, CAPILLARY
Glucose-Capillary: 84 mg/dL (ref 65–99)
Glucose-Capillary: 90 mg/dL (ref 65–99)
Glucose-Capillary: 103 mg/dL — ABNORMAL HIGH (ref 65–99)
Glucose-Capillary: 94 mg/dL (ref 65–99)

## 2016-04-10 MED ORDER — POTASSIUM CHLORIDE CRYS ER 20 MEQ PO TBCR
40.0000 meq | EXTENDED_RELEASE_TABLET | Freq: Once | ORAL | Status: AC
  Administered 2016-04-10: 40 meq via ORAL
  Filled 2016-04-10: qty 2

## 2016-04-10 MED ORDER — DEXTROSE 5 % IV SOLN
10.0000 mmol | Freq: Once | INTRAVENOUS | Status: AC
  Administered 2016-04-10: 10 mmol via INTRAVENOUS
  Filled 2016-04-10: qty 3.33

## 2016-04-10 MED ORDER — PROMETHAZINE HCL 12.5 MG PO TABS: 12.5000 mg | ORAL_TABLET | Freq: Four times a day (QID) | ORAL | 0 refills | Status: DC | PRN

## 2016-04-10 MED ORDER — LOPERAMIDE HCL 2 MG PO CAPS
2.0000 mg | ORAL_CAPSULE | ORAL | Status: DC | PRN
  Administered 2016-04-10: 2 mg via ORAL
  Filled 2016-04-10: qty 1

## 2016-04-10 MED ORDER — LOPERAMIDE HCL 2 MG PO CAPS: 2.0000 mg | ORAL_CAPSULE | ORAL | 0 refills | Status: AC | PRN

## 2016-04-10 MED ORDER — MAGNESIUM SULFATE 2 GM/50ML IV SOLN
2.0000 g | Freq: Once | INTRAVENOUS | Status: AC
  Administered 2016-04-10: 2 g via INTRAVENOUS
  Filled 2016-04-10: qty 50

## 2016-04-10 NOTE — Progress Notes (Signed)
**  Preliminary report by tech**  Carotid artery duplex complete. Findings are consistent with a 1-39 percent stenosis involving the right internal carotid artery and the left internal carotid artery. The vertebral arteries demonstrate antegrade flow.  04/10/16 4:19 PM [name] RVT

## 2016-04-10 NOTE — Discharge Summary (Addendum)
Physician Discharge Summary  [name] [id number] DOB: 1945-08-06 DOA: 04/09/2016  PCP: [name], MD  Admit date: 04/09/2016 Discharge date: 04/10/2016  Admitted From: Home Disposition:  Home  Recommendations for Outpatient Follow-up:  1. Follow up with PCP in 1-2 weeks and Cardiology in One week for event monitor  2. Please obtain BMP/CBC in one week 3. Please follow up on the following pending results:  Home Health:No Equipment/Devices:None  Discharge Condition:Stable CODE STATUS:Full  Diet recommendation: return to home diet prior to admission   Brief/Interim Summary: Patient was admitted for diarrhea and syncope. Diarrhea has been chronic in nature, she is C diff negative and GI panel showed she is Norovirus positive. Cardiac causes of her syncope has been ruled out. Cardiology was consulted who suggested getting outpatient event monitor.   Carotid US showed 1-39% stenosis b/l  Discharge Diagnoses:  Principal Problem:   Syncope Active Problems:   Hyperlipidemia   Essential hypertension   Diabetes mellitus type II, controlled ([place])   Fecal incontinence   Diarrhea   Abdominal pain with vomiting  Syncope -  Likely due to dehydration from viral gastroenteritis  No previous history of CAD or arrhythmia Cardiology rec- outpatient event monitor. Follow up in One week. Carotid Artery [place]: 1-39% stenosis b/l ICA   Abdominal pain, nausea , vomiting, diarrhea C.diff is negative, abdominal CT without acute findings GI panel shows norovirus positive  Supportive care  Immodium for her chronic diarrhea    DM type II - most recent HgbA1C is 6.2% on 12/24/15 Hold Amaryl while hospitalized Continue diabetic diet when tolerates po,monitor FSBS QACHS, add sliding scale insulin  Hypertension - currently borderline hypotensive Hold Diovan/HCTZ, continue to monitor BP  Hyperlipidemia Hold statin now and restart when patient can tolerate oral  intake  Dementia, mild Continue Aricept Provide supportive care  Hypokalemia Monitor lytes at this time     Discharge Instructions   Allergies as of 04/10/2016      Reactions   Lisinopril Diarrhea, Other (See Comments)   REACTION: hallucinations/diarrhea   Metformin And Related Diarrhea      Medication List    TAKE these medications   ALIGN 4 MG Caps Take 1 capsule by mouth daily.   aspirin 81 MG tablet Take 81 mg by mouth daily.   atorvastatin 10 MG tablet Commonly known as:  LIPITOR TAKE ONE TABLET BY MOUTH ONCE DAILY   brimonidine 0.2 % ophthalmic solution Commonly known as:  ALPHAGAN PLACE ONE DROP IN EACH EYE THREE TIMES DAILY   donepezil 5 MG tablet Commonly known as:  ARICEPT Take 1 tablet (5 mg total) by mouth at bedtime.   glimepiride 1 MG tablet Commonly known as:  AMARYL TAKE ONE TABLET BY MOUTH ONCE DAILY BEFORE BREAKFAST.   glucose blood test strip Commonly known as:  ONETOUCH VERIO Use to test blood sugars daily. Dx: E11.9   GNP CALCIUM 1200 1200-1000 MG-UNIT Chew Chew 1 tablet by mouth daily.   latanoprost 0.005 % ophthalmic solution Commonly known as:  XALATAN Place 1 drop into both eyes at bedtime.   loperamide 2 MG capsule Commonly known as:  IMODIUM Take 1 capsule (2 mg total) by mouth as needed for diarrhea or loose stools.   LUBRICANT EYE DROPS OP Place 1 drop into both eyes at bedtime.   Multivitamin/Iron Tabs Take 1 tablet by mouth daily.   ONETOUCH DELICA LANCETS 99991111 Misc Use to check blood sugars daily. Dx: E11.9   promethazine 12.5 MG tablet Commonly known  as:  PHENERGAN Take 1 tablet (12.5 mg total) by mouth every 6 (six) hours as needed for nausea.   valsartan-hydrochlorothiazide 80-12.5 MG tablet Commonly known as:  DIOVAN-HCT Take 0.5 tablets by mouth daily.      Follow-up Information    Adrian Prows, MD Follow up in 1 week(s).   Specialty:  Cardiology Why:  Patient needs event monitor per Dr Irven Shelling  inpatient recommendation  Contact information: [place] [address] [phone]        [name], MD Follow up in 2 week(s).   Specialty:  Family Medicine Contact information: [place] [address] [phone]          Allergies  Allergen Reactions  . Lisinopril Diarrhea and Other (See Comments)    REACTION: hallucinations/diarrhea  . Metformin And Related Diarrhea    Consultations:  [facility], Dr Adrian Prows   Procedures/Studies: Ct Head Wo Contrast  Result Date: 04/09/2016 CLINICAL DATA:  Unwitnessed fall. EXAM: CT HEAD WITHOUT CONTRAST TECHNIQUE: Contiguous axial images were obtained from the base of the skull through the vertex without intravenous contrast. COMPARISON:  01/09/2016 FINDINGS: Brain: There is no intracranial hemorrhage, mass or evidence of acute infarction. There is moderate generalized atrophy. There is moderate chronic microvascular ischemic change. There is no significant extra-axial fluid collection. No acute intracranial findings are evident. Vascular: No hyperdense vessel or unexpected calcification. Skull: Normal. Negative for fracture or focal lesion. Sinuses/Orbits: No acute finding. Other: None. IMPRESSION: No acute intracranial findings. There is moderate generalized atrophy and chronic appearing white matter hypodensities which likely represent small vessel ischemic disease. Electronically Signed   By: Andreas Newport M.D.   On: 04/09/2016 05:56   Ct Abdomen Pelvis W Contrast  Result Date: 04/09/2016 CLINICAL DATA:  Unwitnessed fall.  Found on floor. EXAM: CT ABDOMEN AND PELVIS WITH CONTRAST TECHNIQUE: Multidetector CT imaging of the abdomen and pelvis was performed using the standard protocol following bolus administration of intravenous contrast. CONTRAST:  18mL ISOVUE-300 IOPAMIDOL (ISOVUE-300) INJECTION 61% COMPARISON:  11/22/2007 FINDINGS: Lower chest: No acute abnormality.  Hepatobiliary: No focal liver abnormality is seen. No gallstones, gallbladder wall thickening, or biliary dilatation. Pancreas: Unremarkable. No pancreatic ductal dilatation or surrounding inflammatory changes. Spleen: Normal in size without focal abnormality. Adrenals/Urinary Tract: Adrenal glands are unremarkable. Kidneys are normal, without renal calculi, focal lesion, or hydronephrosis. Bladder is unremarkable. Stomach/Bowel: Stomach is within normal limits. Colon appears normal. No evidence of bowel wall thickening, distention, or inflammatory changes. Vascular/Lymphatic: The abdominal aorta is normal in caliber with mild atherosclerotic calcification. No adenopathy in the abdomen or pelvis. Reproductive: Status post hysterectomy. No adnexal masses. Other: No inflammatory changes.  No ascites. Musculoskeletal: No significant skeletal lesions. IMPRESSION: No significant abnormality. Electronically Signed   By: Andreas Newport M.D.   On: 04/09/2016 06:15   Dg Chest Port 1 View  Result Date: 04/09/2016 CLINICAL DATA:  Nausea, vomiting EXAM: PORTABLE CHEST 1 VIEW COMPARISON:  None. FINDINGS: Heart is normal size. No confluent airspace opacities or effusions. No acute bony abnormality. IMPRESSION: No active disease. Electronically Signed   By: Rolm Baptise M.D.   On: 04/09/2016 14:13       Subjective:   Discharge Exam: Vitals:   04/10/16 0701 04/10/16 1337  BP: (!) 108/45 (!) 115/48  Pulse: 76 67  Resp: 18 16  Temp: 99.1 F (37.3 C) 98 F (36.7 C)   Vitals:   04/09/16 2152 04/10/16 0540 04/10/16 0701 04/10/16 1337  BP:  (!) 104/33 (!) 108/45 (!) 115/48  Pulse: 80 94 76 67  Resp:  18 18 16   Temp: 99.7 F (37.6 C) 100 F (37.8 C) 99.1 F (37.3 C) 98 F (36.7 C)  TempSrc: Oral Oral Oral Oral  SpO2: 100% 94% 95% 100%  Weight:      Height:        General: Pt is alert, awake, not in acute distress Cardiovascular: RRR, S1/S2 +, no rubs, no gallops Respiratory: CTA bilaterally, no  wheezing, no rhonchi Abdominal: Soft, NT, ND, bowel sounds + Extremities: no edema, no cyanosis    The results of significant diagnostics from this hospitalization (including imaging, microbiology, ancillary and laboratory) are listed below for reference.     Microbiology: Recent Results (from the past 240 hour(s))  Gastrointestinal Panel by PCR , Stool     Status: Abnormal   Collection Time: 04/09/16  4:16 AM  Result Value Ref Range Status   Campylobacter species NOT DETECTED NOT DETECTED Final   Plesimonas shigelloides NOT DETECTED NOT DETECTED Final   Salmonella species NOT DETECTED NOT DETECTED Final   Yersinia enterocolitica NOT DETECTED NOT DETECTED Final   Vibrio species NOT DETECTED NOT DETECTED Final   Vibrio cholerae NOT DETECTED NOT DETECTED Final   Enteroaggregative E coli (EAEC) NOT DETECTED NOT DETECTED Final   Enteropathogenic E coli (EPEC) NOT DETECTED NOT DETECTED Final   Enterotoxigenic E coli (ETEC) NOT DETECTED NOT DETECTED Final   Shiga like toxin producing E coli (STEC) NOT DETECTED NOT DETECTED Final   Shigella/Enteroinvasive E coli (EIEC) NOT DETECTED NOT DETECTED Final   Cryptosporidium NOT DETECTED NOT DETECTED Final   Cyclospora cayetanensis NOT DETECTED NOT DETECTED Final   Entamoeba histolytica NOT DETECTED NOT DETECTED Final   Giardia lamblia NOT DETECTED NOT DETECTED Final   Adenovirus F40/41 NOT DETECTED NOT DETECTED Final   Astrovirus NOT DETECTED NOT DETECTED Final   Norovirus GI/GII DETECTED (A) NOT DETECTED Final    Comment: CRITICAL RESULT CALLED TO, READ BACK BY AND VERIFIED WITH: KELLY WALDON 04/09/16 1451 SGD    Rotavirus A NOT DETECTED NOT DETECTED Final   Sapovirus (I, II, IV, and V) NOT DETECTED NOT DETECTED Final  C difficile quick scan w PCR reflex     Status: None   Collection Time: 04/09/16  4:16 AM  Result Value Ref Range Status   C Diff antigen NEGATIVE NEGATIVE Final   C Diff toxin NEGATIVE NEGATIVE Final   C Diff  interpretation No C. difficile detected.  Final  Respiratory Panel by PCR     Status: None   Collection Time: 04/09/16 11:30 AM  Result Value Ref Range Status   Adenovirus NOT DETECTED NOT DETECTED Final   Coronavirus 229E NOT DETECTED NOT DETECTED Final   Coronavirus HKU1 NOT DETECTED NOT DETECTED Final   Coronavirus NL63 NOT DETECTED NOT DETECTED Final   Coronavirus OC43 NOT DETECTED NOT DETECTED Final   Metapneumovirus NOT DETECTED NOT DETECTED Final   Rhinovirus / Enterovirus NOT DETECTED NOT DETECTED Final   Influenza A NOT DETECTED NOT DETECTED Final   Influenza B NOT DETECTED NOT DETECTED Final   Parainfluenza Virus 1 NOT DETECTED NOT DETECTED Final   Parainfluenza Virus 2 NOT DETECTED NOT DETECTED Final   Parainfluenza Virus 3 NOT DETECTED NOT DETECTED Final   Parainfluenza Virus 4 NOT DETECTED NOT DETECTED Final   Respiratory Syncytial Virus NOT DETECTED NOT DETECTED Final   Bordetella pertussis NOT DETECTED NOT DETECTED Final   Chlamydophila  pneumoniae NOT DETECTED NOT DETECTED Final   Mycoplasma pneumoniae NOT DETECTED NOT DETECTED Final     Labs: BNP (last 3 results) No results for input(s): BNP in the last 8760 hours. Basic Metabolic Panel:  Recent Labs Lab 04/09/16 0252 04/09/16 1054 04/10/16 0621  NA 140  --  140  K 3.3*  --  3.4*  CL 102  --  111  CO2 26  --  25  GLUCOSE 195*  --  90  BUN 17  --  9  CREATININE 0.90 0.91 0.81  CALCIUM 9.7  --  7.5*  MG  --  1.5*  --   PHOS  --  1.6*  --    Liver Function Tests:  Recent Labs Lab 04/10/16 0621  AST 23  ALT 19  ALKPHOS 29*  BILITOT 0.5  PROT 4.8*  ALBUMIN 2.7*    Recent Labs Lab 04/09/16 0252  LIPASE 22   No results for input(s): AMMONIA in the last 168 hours. CBC:  Recent Labs Lab 04/09/16 0252 04/09/16 1054 04/10/16 0621  WBC 17.3* 14.6* 6.6  HGB 13.8 11.2* 10.3*  HCT 41.6 33.3* 31.1*  MCV 88.7 88.8 89.1  PLT 312 252 226   Cardiac Enzymes: No results for input(s): CKTOTAL,  CKMB, CKMBINDEX, TROPONINI in the last 168 hours. BNP: Invalid input(s): POCBNP CBG:  Recent Labs Lab 04/09/16 1717 04/09/16 2152 04/10/16 0810 04/10/16 1216 04/10/16 1644  GLUCAP 84 110* 84 90 103*   D-Dimer No results for input(s): DDIMER in the last 72 hours. Hgb A1c No results for input(s): HGBA1C in the last 72 hours. Lipid Profile No results for input(s): CHOL, HDL, LDLCALC, TRIG, CHOLHDL, LDLDIRECT in the last 72 hours. Thyroid function studies  Recent Labs  04/09/16 1054  TSH 1.030   Anemia work up No results for input(s): VITAMINB12, FOLATE, FERRITIN, TIBC, IRON, RETICCTPCT in the last 72 hours. Urinalysis    Component Value Date/Time   COLORURINE YELLOW 04/09/2016 1441   APPEARANCEUR CLEAR 04/09/2016 1441   LABSPEC 1.045 (H) 04/09/2016 1441   PHURINE 5.0 04/09/2016 1441   GLUCOSEU NEGATIVE 04/09/2016 1441   HGBUR NEGATIVE 04/09/2016 1441   BILIRUBINUR NEGATIVE 04/09/2016 1441   KETONESUR NEGATIVE 04/09/2016 1441   PROTEINUR NEGATIVE 04/09/2016 1441   UROBILINOGEN 0.2 03/15/2012 2005   NITRITE NEGATIVE 04/09/2016 1441   LEUKOCYTESUR NEGATIVE 04/09/2016 1441   Sepsis Labs Invalid input(s): PROCALCITONIN,  WBC,  LACTICIDVEN Microbiology Recent Results (from the past 240 hour(s))  Gastrointestinal Panel by PCR , Stool     Status: Abnormal   Collection Time: 04/09/16  4:16 AM  Result Value Ref Range Status   Campylobacter species NOT DETECTED NOT DETECTED Final   Plesimonas shigelloides NOT DETECTED NOT DETECTED Final   Salmonella species NOT DETECTED NOT DETECTED Final   Yersinia enterocolitica NOT DETECTED NOT DETECTED Final   Vibrio species NOT DETECTED NOT DETECTED Final   Vibrio cholerae NOT DETECTED NOT DETECTED Final   Enteroaggregative E coli (EAEC) NOT DETECTED NOT DETECTED Final   Enteropathogenic E coli (EPEC) NOT DETECTED NOT DETECTED Final   Enterotoxigenic E coli (ETEC) NOT DETECTED NOT DETECTED Final   Shiga like toxin producing E coli  (STEC) NOT DETECTED NOT DETECTED Final   Shigella/Enteroinvasive E coli (EIEC) NOT DETECTED NOT DETECTED Final   Cryptosporidium NOT DETECTED NOT DETECTED Final   Cyclospora cayetanensis NOT DETECTED NOT DETECTED Final   Entamoeba histolytica NOT DETECTED NOT DETECTED Final   Giardia lamblia NOT DETECTED NOT DETECTED Final  Adenovirus F40/41 NOT DETECTED NOT DETECTED Final   Astrovirus NOT DETECTED NOT DETECTED Final   Norovirus GI/GII DETECTED (A) NOT DETECTED Final    Comment: CRITICAL RESULT CALLED TO, READ BACK BY AND VERIFIED WITH: KELLY WALDON 04/09/16 1451 SGD    Rotavirus A NOT DETECTED NOT DETECTED Final   Sapovirus (I, II, IV, and V) NOT DETECTED NOT DETECTED Final  C difficile quick scan w PCR reflex     Status: None   Collection Time: 04/09/16  4:16 AM  Result Value Ref Range Status   C Diff antigen NEGATIVE NEGATIVE Final   C Diff toxin NEGATIVE NEGATIVE Final   C Diff interpretation No C. difficile detected.  Final  Respiratory Panel by PCR     Status: None   Collection Time: 04/09/16 11:30 AM  Result Value Ref Range Status   Adenovirus NOT DETECTED NOT DETECTED Final   Coronavirus 229E NOT DETECTED NOT DETECTED Final   Coronavirus HKU1 NOT DETECTED NOT DETECTED Final   Coronavirus NL63 NOT DETECTED NOT DETECTED Final   Coronavirus OC43 NOT DETECTED NOT DETECTED Final   Metapneumovirus NOT DETECTED NOT DETECTED Final   Rhinovirus / Enterovirus NOT DETECTED NOT DETECTED Final   Influenza A NOT DETECTED NOT DETECTED Final   Influenza B NOT DETECTED NOT DETECTED Final   Parainfluenza Virus 1 NOT DETECTED NOT DETECTED Final   Parainfluenza Virus 2 NOT DETECTED NOT DETECTED Final   Parainfluenza Virus 3 NOT DETECTED NOT DETECTED Final   Parainfluenza Virus 4 NOT DETECTED NOT DETECTED Final   Respiratory Syncytial Virus NOT DETECTED NOT DETECTED Final   Bordetella pertussis NOT DETECTED NOT DETECTED Final   Chlamydophila pneumoniae NOT DETECTED NOT DETECTED Final    Mycoplasma pneumoniae NOT DETECTED NOT DETECTED Final     Time coordinating discharge: Over 30 minutes  SIGNED:   Damita Lack, MD  [facility] 04/10/2016, 6:44 PM Pager   If 7PM-7AM, please contact night-coverage [email or web address] Password [email or web address]

## 2016-04-10 NOTE — Progress Notes (Signed)
PROGRESS NOTE    [name]  [id number] DOB: 28-Jan-1946 DOA: 04/09/2016 PCP: [name], MD   Brief Narrative:   Patient was admitted for diarrhea and syncope. Diarrhea has been chronic in nature, she is C diff negative and GI panel showed she is Norovirus positive. Cardiac causes of her syncope has been ruled out but carotid US is pending at this time.   Assessment & Plan:   Principal Problem:   Syncope Active Problems:   Hyperlipidemia   Essential hypertension   Diabetes mellitus type II, controlled ([place])   Fecal incontinence   Diarrhea   Abdominal pain with vomiting  Syncope -  Likely due to dehydration from viral gastroenteritis  No previous history of CAD or arrhythmia Cardiology rec- outpatient even monitor.    Abdominal pain, nausea , vomiting, diarrhea  C.diff is negative, abdominal CT without acute findings GI panel shows norovirus positive  Supportive care  Immodium for her chronic diarrhea    DM type II - most recent HgbA1C is 6.2% on 12/24/15 Hold Amaryl while hospitalized Continue diabetic diet when tolerates po, monitor FSBS QACHS, add sliding scale insulin  Hypertension - currently borderline hypotensive Hold Diovan/HCTZ, continue to monitor BP  Hyperlipidemia Hold statin now and restart when patient can tolerate oral intake  Dementia, mild Continue Aricept Provide supportive care  Hypokalemia Monitor lytes at this time   DVT prophylaxis: Lovenox Code Status: Full Family Communication: husband and daughter Disposition Plan: If carotid US is ok and not markedly abnormal she can be discharged with outpatient follow up with Cardiology Dr Einar Gip Consults called: cardiology Admission status: observation    Consultants:   Cardiology   Procedures:   None   Antimicrobials:   None    Subjective: Patient states she feels much better today. Reports her diarrhea has been chronic and ongoing for atleast 8 yrs. Doesn't  have any complaints today. Tolerating po diet. No more episodes of feeing dizzy or syncope.   Objective: Vitals:   04/09/16 2152 04/10/16 0540 04/10/16 0701 04/10/16 1337  BP:  (!) 104/33 (!) 108/45 (!) 115/48  Pulse: 80 94 76 67  Resp:  18 18 16   Temp: 99.7 F (37.6 C) 100 F (37.8 C) 99.1 F (37.3 C) 98 F (36.7 C)  TempSrc: Oral Oral Oral Oral  SpO2: 100% 94% 95% 100%  Weight:      Height:        Intake/Output Summary (Last 24 hours) at 04/10/16 1834 Last data filed at 04/10/16 1415  Gross per 24 hour  Intake           663.33 ml  Output              250 ml  Net           413.33 ml   Filed Weights   04/09/16 1602  Weight: 53.5 kg (118 lb)    Examination:  General exam: Appears calm and comfortable  Respiratory system: Clear to auscultation. Respiratory effort normal. Cardiovascular system: S1 & S2 heard, RRR. No JVD, murmurs, rubs, gallops or clicks. No pedal edema. Gastrointestinal system: Abdomen is nondistended, soft and nontender. No organomegaly or masses felt. Normal bowel sounds heard. Central nervous system: Alert and oriented. No focal neurological deficits. Extremities: Symmetric 5 x 5 power. Skin: No rashes, lesions or ulcers Psychiatry: Judgement and insight appear normal. Mood & affect appropriate.     Data Reviewed:   CBC:  Recent Labs Lab 04/09/16 0252 04/09/16 1054 04/10/16 [id number]  WBC 17.3* 14.6* 6.6  HGB 13.8 11.2* 10.3*  HCT 41.6 33.3* 31.1*  MCV 88.7 88.8 89.1  PLT 312 252 [id number]   Basic Metabolic Panel:  Recent Labs Lab 04/09/16 0252 04/09/16 1054 04/10/16 0621  NA 140  --  140  K 3.3*  --  3.4*  CL 102  --  111  CO2 26  --  25  GLUCOSE 195*  --  90  BUN 17  --  9  CREATININE 0.90 0.91 0.81  CALCIUM 9.7  --  7.5*  MG  --  1.5*  --   PHOS  --  1.6*  --    GFR: Estimated Creatinine Clearance: 50.4 mL/min (by C-G formula based on SCr of 0.81 mg/dL). Liver Function Tests:  Recent Labs Lab 04/10/16 0621  AST 23  ALT 19    ALKPHOS 29*  BILITOT 0.5  PROT 4.8*  ALBUMIN 2.7*    Recent Labs Lab 04/09/16 0252  LIPASE 22   No results for input(s): AMMONIA in the last 168 hours. Coagulation Profile: No results for input(s): INR, PROTIME in the last 168 hours. Cardiac Enzymes: No results for input(s): CKTOTAL, CKMB, CKMBINDEX, TROPONINI in the last 168 hours. BNP (last 3 results) No results for input(s): PROBNP in the last 8760 hours. HbA1C: No results for input(s): HGBA1C in the last 72 hours. CBG:  Recent Labs Lab 04/09/16 1717 04/09/16 2152 04/10/16 0810 04/10/16 1216 04/10/16 1644  GLUCAP 84 110* 84 90 103*   Lipid Profile: No results for input(s): CHOL, HDL, LDLCALC, TRIG, CHOLHDL, LDLDIRECT in the last 72 hours. Thyroid Function Tests:  Recent Labs  04/09/16 1054  TSH 1.030   Anemia Panel: No results for input(s): VITAMINB12, FOLATE, FERRITIN, TIBC, IRON, RETICCTPCT in the last 72 hours. Sepsis Labs:  Recent Labs Lab 04/09/16 0500  LATICACIDVEN 1.82    Recent Results (from the past 240 hour(s))  Gastrointestinal Panel by PCR , Stool     Status: Abnormal   Collection Time: 04/09/16  4:16 AM  Result Value Ref Range Status   Campylobacter species NOT DETECTED NOT DETECTED Final   Plesimonas shigelloides NOT DETECTED NOT DETECTED Final   Salmonella species NOT DETECTED NOT DETECTED Final   Yersinia enterocolitica NOT DETECTED NOT DETECTED Final   Vibrio species NOT DETECTED NOT DETECTED Final   Vibrio cholerae NOT DETECTED NOT DETECTED Final   Enteroaggregative E coli (EAEC) NOT DETECTED NOT DETECTED Final   Enteropathogenic E coli (EPEC) NOT DETECTED NOT DETECTED Final   Enterotoxigenic E coli (ETEC) NOT DETECTED NOT DETECTED Final   Shiga like toxin producing E coli (STEC) NOT DETECTED NOT DETECTED Final   Shigella/Enteroinvasive E coli (EIEC) NOT DETECTED NOT DETECTED Final   Cryptosporidium NOT DETECTED NOT DETECTED Final   Cyclospora cayetanensis NOT DETECTED NOT  DETECTED Final   Entamoeba histolytica NOT DETECTED NOT DETECTED Final   Giardia lamblia NOT DETECTED NOT DETECTED Final   Adenovirus F40/41 NOT DETECTED NOT DETECTED Final   Astrovirus NOT DETECTED NOT DETECTED Final   Norovirus GI/GII DETECTED (A) NOT DETECTED Final    Comment: CRITICAL RESULT CALLED TO, READ BACK BY AND VERIFIED WITH: KELLY WALDON 04/09/16 1451 SGD    Rotavirus A NOT DETECTED NOT DETECTED Final   Sapovirus (I, II, IV, and V) NOT DETECTED NOT DETECTED Final  C difficile quick scan w PCR reflex     Status: None   Collection Time: 04/09/16  4:16 AM  Result Value Ref Range Status   C  Diff antigen NEGATIVE NEGATIVE Final   C Diff toxin NEGATIVE NEGATIVE Final   C Diff interpretation No C. difficile detected.  Final  Respiratory Panel by PCR     Status: None   Collection Time: 04/09/16 11:30 AM  Result Value Ref Range Status   Adenovirus NOT DETECTED NOT DETECTED Final   Coronavirus 229E NOT DETECTED NOT DETECTED Final   Coronavirus HKU1 NOT DETECTED NOT DETECTED Final   Coronavirus NL63 NOT DETECTED NOT DETECTED Final   Coronavirus OC43 NOT DETECTED NOT DETECTED Final   Metapneumovirus NOT DETECTED NOT DETECTED Final   Rhinovirus / Enterovirus NOT DETECTED NOT DETECTED Final   Influenza A NOT DETECTED NOT DETECTED Final   Influenza B NOT DETECTED NOT DETECTED Final   Parainfluenza Virus 1 NOT DETECTED NOT DETECTED Final   Parainfluenza Virus 2 NOT DETECTED NOT DETECTED Final   Parainfluenza Virus 3 NOT DETECTED NOT DETECTED Final   Parainfluenza Virus 4 NOT DETECTED NOT DETECTED Final   Respiratory Syncytial Virus NOT DETECTED NOT DETECTED Final   Bordetella pertussis NOT DETECTED NOT DETECTED Final   Chlamydophila pneumoniae NOT DETECTED NOT DETECTED Final   Mycoplasma pneumoniae NOT DETECTED NOT DETECTED Final         Radiology Studies: Ct Head Wo Contrast  Result Date: 04/09/2016 CLINICAL DATA:  Unwitnessed fall. EXAM: CT HEAD WITHOUT CONTRAST TECHNIQUE:  Contiguous axial images were obtained from the base of the skull through the vertex without intravenous contrast. COMPARISON:  01/09/2016 FINDINGS: Brain: There is no intracranial hemorrhage, mass or evidence of acute infarction. There is moderate generalized atrophy. There is moderate chronic microvascular ischemic change. There is no significant extra-axial fluid collection. No acute intracranial findings are evident. Vascular: No hyperdense vessel or unexpected calcification. Skull: Normal. Negative for fracture or focal lesion. Sinuses/Orbits: No acute finding. Other: None. IMPRESSION: No acute intracranial findings. There is moderate generalized atrophy and chronic appearing white matter hypodensities which likely represent small vessel ischemic disease. Electronically Signed   By: Andreas Newport M.D.   On: 04/09/2016 05:56   Ct Abdomen Pelvis W Contrast  Result Date: 04/09/2016 CLINICAL DATA:  Unwitnessed fall.  Found on floor. EXAM: CT ABDOMEN AND PELVIS WITH CONTRAST TECHNIQUE: Multidetector CT imaging of the abdomen and pelvis was performed using the standard protocol following bolus administration of intravenous contrast. CONTRAST:  163mL ISOVUE-300 IOPAMIDOL (ISOVUE-300) INJECTION 61% COMPARISON:  11/22/2007 FINDINGS: Lower chest: No acute abnormality. Hepatobiliary: No focal liver abnormality is seen. No gallstones, gallbladder wall thickening, or biliary dilatation. Pancreas: Unremarkable. No pancreatic ductal dilatation or surrounding inflammatory changes. Spleen: Normal in size without focal abnormality. Adrenals/Urinary Tract: Adrenal glands are unremarkable. Kidneys are normal, without renal calculi, focal lesion, or hydronephrosis. Bladder is unremarkable. Stomach/Bowel: Stomach is within normal limits. Colon appears normal. No evidence of bowel wall thickening, distention, or inflammatory changes. Vascular/Lymphatic: The abdominal aorta is normal in caliber with mild atherosclerotic  calcification. No adenopathy in the abdomen or pelvis. Reproductive: Status post hysterectomy. No adnexal masses. Other: No inflammatory changes.  No ascites. Musculoskeletal: No significant skeletal lesions. IMPRESSION: No significant abnormality. Electronically Signed   By: Andreas Newport M.D.   On: 04/09/2016 06:15   Dg Chest Port 1 View  Result Date: 04/09/2016 CLINICAL DATA:  Nausea, vomiting EXAM: PORTABLE CHEST 1 VIEW COMPARISON:  None. FINDINGS: Heart is normal size. No confluent airspace opacities or effusions. No acute bony abnormality. IMPRESSION: No active disease. Electronically Signed   By: Rolm Baptise M.D.   On: 04/09/2016 14:13  Scheduled Meds: . acidophilus  1 capsule Oral Daily  . aspirin EC  81 mg Oral Daily  . brimonidine  1 drop Both Eyes TID  . donepezil  5 mg Oral QHS  . enoxaparin (LOVENOX) injection  40 mg Subcutaneous Q24H  . insulin aspart  0-9 Units Subcutaneous TID WC  . latanoprost  1 drop Both Eyes QHS  . polyvinyl alcohol  1 drop Both Eyes QHS  . potassium phosphate IVPB (mmol)  10 mmol Intravenous Once  . sodium chloride flush  3 mL Intravenous Q12H   Continuous Infusions:   LOS: 0 days    Time spent:40 mins     Ankit Arsenio Loader, MD [facility] Pager [phone]   If 7PM-7AM, please contact night-coverage [email or web address] Password [facility] 04/10/2016, 6:34 PM

## 2016-04-10 NOTE — Discharge Instructions (Signed)
Dehydration, Adult Dehydration is when there is not enough fluid or water in your body. This happens when you lose more fluids than you take in. Dehydration can range from mild to very bad. It should be treated right away to keep it from getting very bad. Symptoms of mild dehydration may include:   Thirst.  Dry lips.  Slightly dry mouth.  Dry, warm skin.  Dizziness. Symptoms of moderate dehydration may include:   Very dry mouth.  Muscle cramps.  Dark pee (urine). Pee may be the color of tea.  Your body making less pee.  Your eyes making fewer tears.  Heartbeat that is uneven or faster than normal (palpitations).  Headache.  Light-headedness, especially when you stand up from sitting.  Fainting (syncope). Symptoms of very bad dehydration may include:   Changes in skin, such as:  Cold and clammy skin.  Blotchy (mottled) or pale skin.  Skin that does not quickly return to normal after being lightly pinched and let go (poor skin turgor).  Changes in body fluids, such as:  Feeling very thirsty.  Your eyes making fewer tears.  Not sweating when body temperature is high, such as in hot weather.  Your body making very little pee.  Changes in vital signs, such as:  Weak pulse.  Pulse that is more than 100 beats a minute when you are sitting still.  Fast breathing.  Low blood pressure.  Other changes, such as:  Sunken eyes.  Cold hands and feet.  Confusion.  Lack of energy (lethargy).  Trouble waking up from sleep.  Short-term weight loss.  Unconsciousness. Follow these instructions at home:  If told by your doctor, drink an ORS:  Make an ORS by using instructions on the package.  Start by drinking small amounts, about  cup (120 mL) every 5-10 minutes.  Slowly drink more until you have had the amount that your doctor said to have.  Drink enough clear fluid to keep your pee clear or pale yellow. If you were told to drink an ORS, finish the  ORS first, then start slowly drinking clear fluids. Drink fluids such as:  Water. Do not drink only water by itself. Doing that can make the salt (sodium) level in your body get too low (hyponatremia).  Ice chips.  Fruit juice that you have added water to (diluted).  Low-calorie sports drinks.  Avoid:  Alcohol.  Drinks that have a lot of sugar. These include high-calorie sports drinks, fruit juice that does not have water added, and soda.  Caffeine.  Foods that are greasy or have a lot of fat or sugar.  Take over-the-counter and prescription medicines only as told by your doctor.  Do not take salt tablets. Doing that can make the salt level in your body get too high (hypernatremia).  Eat foods that have minerals (electrolytes). Examples include bananas, oranges, potatoes, tomatoes, and spinach.  Keep all follow-up visits as told by your doctor. This is important. Contact a doctor if:  You have belly (abdominal) pain that:  Gets worse.  Stays in one area (localizes).  You have a rash.  You have a stiff neck.  You get angry or annoyed more easily than normal (irritability).  You are more sleepy than normal.  You have a harder time waking up than normal.  You feel:  Weak.  Dizzy.  Very thirsty.  You have peed (urinated) only a small amount of very dark pee during 6-8 hours. Get help right away if:  You   have symptoms of very bad dehydration.  You cannot drink fluids without throwing up (vomiting).  Your symptoms get worse with treatment.  You have a fever.  You have a very bad headache.  You are throwing up or having watery poop (diarrhea) and it:  Gets worse.  Does not go away.  You have blood or something green (bile) in your throw-up.  You have blood in your poop (stool). This may cause poop to look black and tarry.  You have not peed in 6-8 hours.  You pass out (faint).  Your heart rate when you are sitting still is more than 100 beats a  minute.  You have trouble breathing. This information is not intended to replace advice given to you by your health care provider. Make sure you discuss any questions you have with your health care provider. Document Released: 01/17/2009 Document Revised: 10/11/2015 Document Reviewed: 05/17/2015 Elsevier Interactive Patient Education  2017 [employer]  

## 2016-04-10 NOTE — Progress Notes (Signed)
Patient is medically stable to be discharged but nurse notified me that patient had about 6 loose BM in last 3 hours. This has been chronic for the patient, will cont to observe the patient tonight as family is not comfortably taking her home. Anticipate discharge tomorrow.

## 2016-04-11 DIAGNOSIS — A09 Infectious gastroenteritis and colitis, unspecified: Secondary | ICD-10-CM | POA: Diagnosis not present

## 2016-04-11 DIAGNOSIS — I10 Essential (primary) hypertension: Secondary | ICD-10-CM

## 2016-04-11 DIAGNOSIS — R1110 Vomiting, unspecified: Secondary | ICD-10-CM

## 2016-04-11 DIAGNOSIS — R109 Unspecified abdominal pain: Secondary | ICD-10-CM | POA: Diagnosis not present

## 2016-04-11 DIAGNOSIS — R159 Full incontinence of feces: Secondary | ICD-10-CM

## 2016-04-11 DIAGNOSIS — E119 Type 2 diabetes mellitus without complications: Secondary | ICD-10-CM | POA: Diagnosis not present

## 2016-04-11 DIAGNOSIS — E860 Dehydration: Secondary | ICD-10-CM

## 2016-04-11 DIAGNOSIS — E785 Hyperlipidemia, unspecified: Secondary | ICD-10-CM

## 2016-04-11 DIAGNOSIS — R55 Syncope and collapse: Secondary | ICD-10-CM

## 2016-04-11 LAB — GLUCOSE, CAPILLARY
GLUCOSE-CAPILLARY: 91 mg/dL (ref 65–99)
GLUCOSE-CAPILLARY: 104 mg/dL — AB (ref 65–99)

## 2016-04-11 LAB — MAGNESIUM: Magnesium: 2.2 mg/dL (ref 1.7–2.4)

## 2016-04-11 LAB — BASIC METABOLIC PANEL
Anion gap: 6 (ref 5–15)
BUN: 7 mg/dL (ref 6–20)
CALCIUM: 8.1 mg/dL — AB (ref 8.9–10.3)
CO2: 25 mmol/L (ref 22–32)
CREATININE: 0.75 mg/dL (ref 0.44–1.00)
Chloride: 109 mmol/L (ref 101–111)
GFR calc non Af Amer: >60 mL/min (ref >60)
Glucose, Bld: 89 mg/dL (ref 65–99)
Potassium: 3.7 mmol/L (ref 3.5–5.1)
Sodium: 140 mmol/L (ref 135–145)

## 2016-04-11 NOTE — Progress Notes (Addendum)
Patient was discharged home by MD order; discharged instructions  review and give to patient and her husband with care notes; IV DIC; patient will be escorted to the car by a nurse tech via wheelchair.

## 2016-04-11 NOTE — Discharge Summary (Signed)
Addendum:  Patient feeling better. No New updated. Refer to the discharge summary from 04/10/16.

## 2016-04-11 NOTE — Progress Notes (Signed)
PROGRESS NOTE    [name]  [id number] DOB: 1945-12-09 DOA: 04/09/2016 PCP: [name], MD   Brief Narrative:   Patient was admitted for diarrhea and syncope. Diarrhea has been chronic in nature, she is C diff negative and GI panel showed she is Norovirus positive.  Assessment & Plan:   Principal Problem:   Syncope Active Problems:   Hyperlipidemia   Essential hypertension   Diabetes mellitus type II, controlled ([facility])   Fecal incontinence   Diarrhea   Abdominal pain with vomiting  Syncope -  Likely due to dehydration from viral gastroenteritis  No previous history of CAD or arrhythmia Resolved    Abdominal pain, nausea , vomiting, diarrhea + Norovirus  C.diff is negative, abdominal CT without acute findings GI panel shows norovirus positive  Supportive care  Immodium for her chronic diarrhea    DM type II - most recent HgbA1C is 6.2% on 12/24/15 Hold Amaryl while hospitalized Continue diabetic diet when tolerates po, monitor FSBS QACHS, add sliding scale insulin  Hypertension - currently borderline hypotensive Hold Diovan/HCTZ, continue to monitor BP  Hyperlipidemia Hold statin now and restart when patient can tolerate oral intake  Dementia, mild Continue Aricept Provide supportive care  Hypokalemia Monitor lytes at this time   DVT prophylaxis: Lovenox Code Status: Full Family Communication: husband and daughter Disposition Plan: Discharge today  Consults called: cardiology Admission status: observation    Consultants:   Cardiology   Procedures:   None   Antimicrobials:   None    Subjective: Patient states she feels much better today. She states her last episode of diarrhea was yesterday evening. Today she feels up for going home.  Objective: Vitals:   04/10/16 0701 04/10/16 1337 04/10/16 2134 04/11/16 0515  BP: (!) 108/45 (!) 115/48 (!) 129/52 (!) 125/48  Pulse: 76 67 66 73  Resp: 18 16 18 18   Temp: 99.1 F  (37.3 C) 98 F (36.7 C) 98.5 F (36.9 C) 98.3 F (36.8 C)  TempSrc: Oral Oral Oral Oral  SpO2: 95% 100% 99% 99%  Weight:    52.3 kg (115 lb 4.8 oz)  Height:        Intake/Output Summary (Last 24 hours) at 04/11/16 1107 Last data filed at 04/11/16 0954  Gross per 24 hour  Intake           423.33 ml  Output                0 ml  Net           423.33 ml   Filed Weights   04/09/16 1602 04/11/16 0515  Weight: 53.5 kg (118 lb) 52.3 kg (115 lb 4.8 oz)    Examination:  General exam: Appears calm and comfortable  Respiratory system: Clear to auscultation. Respiratory effort normal. Cardiovascular system: S1 & S2 heard, RRR. No JVD, murmurs, rubs, gallops or clicks. No pedal edema. Gastrointestinal system: Abdomen is nondistended, soft and nontender. No organomegaly or masses felt. Normal bowel sounds heard. Central nervous system: Alert and oriented. No focal neurological deficits. Extremities: Symmetric 5 x 5 power. Skin: No rashes, lesions or ulcers Psychiatry: Judgement and insight appear normal. Mood & affect appropriate.     Data Reviewed:   CBC:  Recent Labs Lab 04/09/16 0252 04/09/16 1054 04/10/16 0621  WBC 17.3* 14.6* 6.6  HGB 13.8 11.2* 10.3*  HCT 41.6 33.3* 31.1*  MCV 88.7 88.8 89.1  PLT 312 252 [id number]   Basic Metabolic Panel:  Recent Labs Lab  04/09/16 0252 04/09/16 1054 04/10/16 0621 04/11/16 0841  NA 140  --  140 140  K 3.3*  --  3.4* 3.7  CL 102  --  111 109  CO2 26  --  25 25  GLUCOSE 195*  --  90 89  BUN 17  --  9 7  CREATININE 0.90 0.91 0.81 0.75  CALCIUM 9.7  --  7.5* 8.1*  MG  --  1.5*  --  2.2  PHOS  --  1.6*  --   --    GFR: Estimated Creatinine Clearance: 51 mL/min (by C-G formula based on SCr of 0.75 mg/dL). Liver Function Tests:  Recent Labs Lab 04/10/16 0621  AST 23  ALT 19  ALKPHOS 29*  BILITOT 0.5  PROT 4.8*  ALBUMIN 2.7*    Recent Labs Lab 04/09/16 0252  LIPASE 22   No results for input(s): AMMONIA in the last 168  hours. Coagulation Profile: No results for input(s): INR, PROTIME in the last 168 hours. Cardiac Enzymes: No results for input(s): CKTOTAL, CKMB, CKMBINDEX, TROPONINI in the last 168 hours. BNP (last 3 results) No results for input(s): PROBNP in the last 8760 hours. HbA1C: No results for input(s): HGBA1C in the last 72 hours. CBG:  Recent Labs Lab 04/10/16 0810 04/10/16 1216 04/10/16 1644 04/10/16 2133 04/11/16 0748  GLUCAP 84 90 103* 94 91   Lipid Profile: No results for input(s): CHOL, HDL, LDLCALC, TRIG, CHOLHDL, LDLDIRECT in the last 72 hours. Thyroid Function Tests:  Recent Labs  04/09/16 1054  TSH 1.030   Anemia Panel: No results for input(s): VITAMINB12, FOLATE, FERRITIN, TIBC, IRON, RETICCTPCT in the last 72 hours. Sepsis Labs:  Recent Labs Lab 04/09/16 0500  LATICACIDVEN 1.82    Recent Results (from the past 240 hour(s))  Gastrointestinal Panel by PCR , Stool     Status: Abnormal   Collection Time: 04/09/16  4:16 AM  Result Value Ref Range Status   Campylobacter species NOT DETECTED NOT DETECTED Final   Plesimonas shigelloides NOT DETECTED NOT DETECTED Final   Salmonella species NOT DETECTED NOT DETECTED Final   Yersinia enterocolitica NOT DETECTED NOT DETECTED Final   Vibrio species NOT DETECTED NOT DETECTED Final   Vibrio cholerae NOT DETECTED NOT DETECTED Final   Enteroaggregative E coli (EAEC) NOT DETECTED NOT DETECTED Final   Enteropathogenic E coli (EPEC) NOT DETECTED NOT DETECTED Final   Enterotoxigenic E coli (ETEC) NOT DETECTED NOT DETECTED Final   Shiga like toxin producing E coli (STEC) NOT DETECTED NOT DETECTED Final   Shigella/Enteroinvasive E coli (EIEC) NOT DETECTED NOT DETECTED Final   Cryptosporidium NOT DETECTED NOT DETECTED Final   Cyclospora cayetanensis NOT DETECTED NOT DETECTED Final   Entamoeba histolytica NOT DETECTED NOT DETECTED Final   Giardia lamblia NOT DETECTED NOT DETECTED Final   Adenovirus F40/41 NOT DETECTED NOT  DETECTED Final   Astrovirus NOT DETECTED NOT DETECTED Final   Norovirus GI/GII DETECTED (A) NOT DETECTED Final    Comment: CRITICAL RESULT CALLED TO, READ BACK BY AND VERIFIED WITH: KELLY WALDON 04/09/16 1451 SGD    Rotavirus A NOT DETECTED NOT DETECTED Final   Sapovirus (I, II, IV, and V) NOT DETECTED NOT DETECTED Final  C difficile quick scan w PCR reflex     Status: None   Collection Time: 04/09/16  4:16 AM  Result Value Ref Range Status   C Diff antigen NEGATIVE NEGATIVE Final   C Diff toxin NEGATIVE NEGATIVE Final   C Diff interpretation No C.  difficile detected.  Final  Respiratory Panel by PCR     Status: None   Collection Time: 04/09/16 11:30 AM  Result Value Ref Range Status   Adenovirus NOT DETECTED NOT DETECTED Final   Coronavirus 229E NOT DETECTED NOT DETECTED Final   Coronavirus HKU1 NOT DETECTED NOT DETECTED Final   Coronavirus NL63 NOT DETECTED NOT DETECTED Final   Coronavirus OC43 NOT DETECTED NOT DETECTED Final   Metapneumovirus NOT DETECTED NOT DETECTED Final   Rhinovirus / Enterovirus NOT DETECTED NOT DETECTED Final   Influenza A NOT DETECTED NOT DETECTED Final   Influenza B NOT DETECTED NOT DETECTED Final   Parainfluenza Virus 1 NOT DETECTED NOT DETECTED Final   Parainfluenza Virus 2 NOT DETECTED NOT DETECTED Final   Parainfluenza Virus 3 NOT DETECTED NOT DETECTED Final   Parainfluenza Virus 4 NOT DETECTED NOT DETECTED Final   Respiratory Syncytial Virus NOT DETECTED NOT DETECTED Final   Bordetella pertussis NOT DETECTED NOT DETECTED Final   Chlamydophila pneumoniae NOT DETECTED NOT DETECTED Final   Mycoplasma pneumoniae NOT DETECTED NOT DETECTED Final         Radiology Studies: Dg Chest Port 1 View  Result Date: 04/09/2016 CLINICAL DATA:  Nausea, vomiting EXAM: PORTABLE CHEST 1 VIEW COMPARISON:  None. FINDINGS: Heart is normal size. No confluent airspace opacities or effusions. No acute bony abnormality. IMPRESSION: No active disease. Electronically Signed    By: Rolm Baptise M.D.   On: 04/09/2016 14:13        Scheduled Meds: . acidophilus  1 capsule Oral Daily  . aspirin EC  81 mg Oral Daily  . brimonidine  1 drop Both Eyes TID  . donepezil  5 mg Oral QHS  . enoxaparin (LOVENOX) injection  40 mg Subcutaneous Q24H  . insulin aspart  0-9 Units Subcutaneous TID WC  . latanoprost  1 drop Both Eyes QHS  . polyvinyl alcohol  1 drop Both Eyes QHS  . sodium chloride flush  3 mL Intravenous Q12H   Continuous Infusions:   LOS: 0 days    Time spent:40 mins     Alice Vitelli Arsenio Loader, MD [facility] Pager [phone]   If 7PM-7AM, please contact night-coverage [email or web address] Password [email or web address] 04/11/2016, 11:07 AM

## 2016-04-14 ENCOUNTER — Telehealth: Payer: Self-pay

## 2016-04-14 DIAGNOSIS — H401131 Primary open-angle glaucoma, bilateral, mild stage: Secondary | ICD-10-CM | POA: Diagnosis not present

## 2016-04-14 DIAGNOSIS — Z961 Presence of intraocular lens: Secondary | ICD-10-CM | POA: Diagnosis not present

## 2016-04-14 NOTE — Telephone Encounter (Signed)
D/C: 04/09/16 To: home  Spoke with pt and she states that she is doing very well. She reports she does have appt with cardiology already scheduled.   Appt 04/21/16 with Dr Yong Channel. Pt aware.    Transition Care Management Follow-up Telephone Call  How have you been since you were released from the hospital? Very well   Do you understand why you were in the hospital? yes   Do you understand the discharge instrcutions? yes  Items Reviewed:  Medications reviewed: yes  Allergies reviewed: yes  Dietary changes reviewed: yes  Referrals reviewed: yes   Functional Questionnaire:   Activities of Daily Living (ADLs):   She states they are independent in the following: ambulation, bathing and hygiene, feeding, continence, grooming, toileting and dressing States they require assistance with the following: none   Any transportation issues/concerns?: no   Any patient concerns? no   Confirmed importance and date/time of follow-up visits scheduled: yes   Confirmed with patient if condition begins to worsen call PCP or go to the ER.  Patient was given the Call-a-Nurse line [phone]: yes

## 2016-04-21 ENCOUNTER — Ambulatory Visit (INDEPENDENT_AMBULATORY_CARE_PROVIDER_SITE_OTHER): Payer: Medicare Other | Admitting: Family Medicine

## 2016-04-21 ENCOUNTER — Encounter: Payer: Self-pay | Admitting: Family Medicine

## 2016-04-21 VITALS — BP 124/68 | HR 69 | Temp 98.2°F | Ht 62.0 in | Wt 111.4 lb

## 2016-04-21 DIAGNOSIS — A0811 Acute gastroenteropathy due to Norwalk agent: Secondary | ICD-10-CM

## 2016-04-21 DIAGNOSIS — I10 Essential (primary) hypertension: Secondary | ICD-10-CM

## 2016-04-21 DIAGNOSIS — E785 Hyperlipidemia, unspecified: Secondary | ICD-10-CM

## 2016-04-21 DIAGNOSIS — I441 Atrioventricular block, second degree: Secondary | ICD-10-CM | POA: Insufficient documentation

## 2016-04-21 DIAGNOSIS — E860 Dehydration: Secondary | ICD-10-CM

## 2016-04-21 DIAGNOSIS — E119 Type 2 diabetes mellitus without complications: Secondary | ICD-10-CM | POA: Diagnosis not present

## 2016-04-21 DIAGNOSIS — R55 Syncope and collapse: Secondary | ICD-10-CM

## 2016-04-21 LAB — CBC WITH DIFFERENTIAL/PLATELET
BASOS PCT: 0.5 % (ref 0.0–3.0)
Basophils Absolute: 0 10*3/uL (ref 0.0–0.1)
EOS PCT: 0.3 % (ref 0.0–5.0)
Eosinophils Absolute: 0 10*3/uL (ref 0.0–0.7)
HEMATOCRIT: 38.7 % (ref 36.0–46.0)
HEMOGLOBIN: 13.1 g/dL (ref 12.0–15.0)
LYMPHS PCT: 20.2 % (ref 12.0–46.0)
Lymphs Abs: 1.6 10*3/uL (ref 0.7–4.0)
MCHC: 33.9 g/dL (ref 30.0–36.0)
MCV: 88 fl (ref 78.0–100.0)
MONO ABS: 0.5 10*3/uL (ref 0.1–1.0)
Monocytes Relative: 6.4 % (ref 3.0–12.0)
Neutro Abs: 5.9 10*3/uL (ref 1.4–7.7)
Neutrophils Relative %: 72.6 % (ref 43.0–77.0)
Platelets: 428 10*3/uL — ABNORMAL HIGH (ref 150.0–400.0)
RBC: 4.4 Mil/uL (ref 3.87–5.11)
RDW: 13.3 % (ref 11.5–15.5)
WBC: 8.1 10*3/uL (ref 4.0–10.5)

## 2016-04-21 LAB — BASIC METABOLIC PANEL
BUN: 13 mg/dL (ref 6–23)
CALCIUM: 9.9 mg/dL (ref 8.4–10.5)
CO2: 33 mEq/L — ABNORMAL HIGH (ref 19–32)
Chloride: 101 mEq/L (ref 96–112)
Creatinine, Ser: 0.74 mg/dL (ref 0.40–1.20)
GFR: 82.14 mL/min (ref >60.00)
GLUCOSE: 77 mg/dL (ref 70–99)
Potassium: 4.4 mEq/L (ref 3.5–5.1)
Sodium: 141 mEq/L (ref 135–145)

## 2016-04-21 NOTE — Progress Notes (Signed)
Pre visit review using our clinic review tool, if applicable. No additional management support is needed unless otherwise documented below in the visit note. 

## 2016-04-21 NOTE — Progress Notes (Signed)
Subjective:  [name] is a 71 y.o. year old very pleasant female patient who presents for transitional care management and hospital follow up for syncopal event due to dehydration/norovirus. Patient was hospitalized from 04/09/16 to 04/10/16 (discharged after 6 pm). A TCM phone call was completed on 04/14/16. Medical complexity moderate  Patient was dealing with diarrhea before time of admission. One evening she suddenly had a syncopal episode. She was admitted and found to be norovirus positive. C. Diff was negative. It was thought that the cause of her syncope was likely dehydration related. She had a CT of her head which showed no acute cause. She did have some atrophy but has known MCI/possible dementia on aricept. She had a chest x-ray without acute cardiopulmonary disease and with diarrhea had CT abd/pelvis which also showed no acute findings. I independently reviewed her x-ray and CT head.  Her EKG showed mobitz type II and cardiology consulted- stated to consider outpatient monitoring. We called Dr. Einar Gip today and he states since no recurrence of syncope and known norovirus he would not pursue further workup. Carotid bruit was heard prompting carotid dopplers which showed no significant blockage at 1-39% bilaterally.   Patients amaryl was held in hospital for diabetes and sugars trended up some. Her atorvastatin was held due to poor oral intake. Her valsartan- hctz was held due to hypotension. Since discharge she has been able to resume these without issues.   ROS- no fever, chills, abdominal pain, chest pain, syncope, presyncope   Past Medical History-  Patient Active Problem List   Diagnosis Date Noted  . Mild cognitive impairment 08/09/2015    Priority: High  . Fecal incontinence 03/23/2014    Priority: High  . Diabetes mellitus type II, controlled ([place]) 10/19/2006    Priority: High  . History of adenomatous polyp of colon 08/09/2015    Priority: Medium  . H/O partial  thyroidectomy 03/23/2014    Priority: Medium  . Hyperlipidemia 10/19/2006    Priority: Medium  . Essential hypertension 10/19/2006    Priority: Medium  . Glaucoma 03/23/2014    Priority: Low  . Family history of malignant neoplasm of gastrointestinal tract 08/26/2010    Priority: Low  . Anemia 08/26/2010    Priority: Low  . Esophageal stricture 07/16/2010    Priority: Low  . Osteopenia 04/22/2007    Priority: Low  . Mobitz type II atrioventricular block 04/21/2016  . Syncope and collapse 04/09/2016    Medications- reviewed and updated Current Outpatient Prescriptions  Medication Sig Dispense Refill  . aspirin 81 MG tablet Take 81 mg by mouth daily.      Marland Kitchen atorvastatin (LIPITOR) 10 MG tablet TAKE ONE TABLET BY MOUTH ONCE DAILY 90 tablet 3  . brimonidine (ALPHAGAN) 0.2 % ophthalmic solution PLACE ONE DROP IN [facility] EYE THREE TIMES DAILY  2  . Calcium Carbonate-Vit D-Min (GNP CALCIUM 1200) 1200-1000 MG-UNIT CHEW Chew 1 tablet by mouth daily.      . Carboxymethylcellulose Sodium (LUBRICANT EYE DROPS OP) Place 1 drop into both eyes at bedtime.    . donepezil (ARICEPT) 5 MG tablet Take 1 tablet (5 mg total) by mouth at bedtime. 30 tablet 5  . glimepiride (AMARYL) 1 MG tablet TAKE ONE TABLET BY MOUTH ONCE DAILY BEFORE BREAKFAST. 90 tablet 2  . glucose blood (ONETOUCH VERIO) test strip Use to test blood sugars daily. Dx: E11.9 100 each 11  . latanoprost (XALATAN) 0.005 % ophthalmic solution Place 1 drop into both eyes at bedtime.     Marland Kitchen  loperamide (IMODIUM) 2 MG capsule Take 1 capsule (2 mg total) by mouth as needed for diarrhea or loose stools. 30 capsule 0  . Multiple Vitamins-Iron (MULTIVITAMIN/IRON) TABS Take 1 tablet by mouth daily.      [name] DELICA LANCETS 99991111 MISC Use to check blood sugars daily. Dx: E11.9 100 each 11  . Probiotic Product (ALIGN) 4 MG CAPS Take 1 capsule by mouth daily.      . promethazine (PHENERGAN) 12.5 MG tablet Take 1 tablet (12.5 mg total) by mouth every 6  (six) hours as needed for nausea. 30 tablet 0  . valsartan-hydrochlorothiazide (DIOVAN-HCT) 80-12.5 MG tablet Take 0.5 tablets by mouth daily. 90 tablet 2   No current facility-administered medications for this visit.     Objective: BP 124/68 (BP Location: Left Arm, Patient Position: Sitting, Cuff Size: Normal)   Pulse 69   Temp 98.2 F (36.8 C) (Oral)   Ht 5\' 2"  (1.575 m)   Wt 111 lb 6.4 oz (50.5 kg)   SpO2 98%   BMI 20.38 kg/m  Gen: NAD, resting comfortably, appears stated age, looks to husband for help with some questions Mucous membranes are moist. CV: RRR no murmurs rubs or gallops Lungs: CTAB no crackles, wheeze, rhonchi Abdomen: soft/nontender/nondistended/normal bowel sounds. No rebound or guarding.  Ext: no edema Skin: warm, dry, no rash Neuro: grossly normal, moves all extremities  Assessment/Plan:  Syncope and collapse Norovirus- now resolved, very high probability dehydration from this caused syncopal episode. Despite mobitz II, no further workup suggested by Dr. Einar Gip at this time. Would pursue this if recurrence.   Essential hypertension - Plan: CBC with Differential/Platelet, Basic metabolic panel - update labs due to mild abnormalities in hospital (slightly worse anemia but no clear source of bleeding) but continue on home meds as above  Mobitz type II atrioventricular block- present on EKG but not likely cause of syncope  Hyperlipidemia, unspecified hyperlipidemia type - tolerating resumption of statin, continue  Controlled type 2 diabetes mellitus without complication, without long-term current use of insulin ([place]) - well controlled on amaryl alone  Orders Placed This Encounter  Procedures  . CBC with Differential/Platelet  . Basic metabolic panel    [place]   Return precautions advised. PRN follow up for this issue. Definitely to return if syncope [name], MD

## 2016-04-21 NOTE — Patient Instructions (Signed)
Labs before you leave  We talked to Dr. Einar Gip (cardiology) and he did not suggest that you have further heart monitoring. This is good news!   If you have recurrent symptoms of passing out without the diarrhea we need to see you immediately though

## 2016-04-21 NOTE — Assessment & Plan Note (Signed)
Norovirus- now resolved, very high probability dehydration from this caused syncopal episode. Despite mobitz II, no further workup suggested by Dr. Einar Gip at this time. Would pursue this if recurrence.

## 2016-06-19 ENCOUNTER — Other Ambulatory Visit: Payer: Self-pay | Admitting: Family Medicine

## 2016-06-23 ENCOUNTER — Encounter: Payer: Self-pay | Admitting: Family Medicine

## 2016-06-23 ENCOUNTER — Ambulatory Visit (INDEPENDENT_AMBULATORY_CARE_PROVIDER_SITE_OTHER): Payer: Medicare Other | Admitting: Family Medicine

## 2016-06-23 DIAGNOSIS — I10 Essential (primary) hypertension: Secondary | ICD-10-CM | POA: Diagnosis not present

## 2016-06-23 DIAGNOSIS — E119 Type 2 diabetes mellitus without complications: Secondary | ICD-10-CM

## 2016-06-23 DIAGNOSIS — E785 Hyperlipidemia, unspecified: Secondary | ICD-10-CM | POA: Diagnosis not present

## 2016-06-23 LAB — COMPREHENSIVE METABOLIC PANEL
ALBUMIN: 4.5 g/dL (ref 3.5–5.2)
ALT: 14 U/L (ref 0–35)
AST: 15 U/L (ref 0–37)
Alkaline Phosphatase: 45 U/L (ref 39–117)
BUN: 17 mg/dL (ref 6–23)
CALCIUM: 9.8 mg/dL (ref 8.4–10.5)
CO2: 32 meq/L (ref 19–32)
CREATININE: 0.74 mg/dL (ref 0.40–1.20)
Chloride: 101 mEq/L (ref 96–112)
GFR: 82.1 mL/min (ref >60.00)
Glucose, Bld: 100 mg/dL — ABNORMAL HIGH (ref 70–99)
POTASSIUM: 3.7 meq/L (ref 3.5–5.1)
Sodium: 140 mEq/L (ref 135–145)
Total Bilirubin: 0.6 mg/dL (ref 0.2–1.2)
Total Protein: 6.9 g/dL (ref 6.0–8.3)

## 2016-06-23 LAB — HEMOGLOBIN A1C: HEMOGLOBIN A1C: 6.2 % (ref 4.6–6.5)

## 2016-06-23 LAB — CBC
HEMATOCRIT: 38.3 % (ref 36.0–46.0)
Hemoglobin: 12.9 g/dL (ref 12.0–15.0)
MCHC: 33.8 g/dL (ref 30.0–36.0)
MCV: 87.8 fl (ref 78.0–100.0)
Platelets: 330 10*3/uL (ref 150.0–400.0)
RBC: 4.36 Mil/uL (ref 3.87–5.11)
RDW: 13.4 % (ref 11.5–15.5)
WBC: 6.9 10*3/uL (ref 4.0–10.5)

## 2016-06-23 LAB — LDL CHOLESTEROL, DIRECT: LDL DIRECT: 41 mg/dL

## 2016-06-23 NOTE — Assessment & Plan Note (Signed)
S: well controlled. On amaryl 1mg . Diarrhea on metformin CBGs- usually low 100s and no lows Exercise and diet- hAS lost weight due to norovirus Lab Results  Component Value Date   HGBA1C 6.2 12/24/2015   HGBA1C 6.4 08/09/2015   HGBA1C 6.9 (H) 06/18/2014   A/P: no changes today, update a1c

## 2016-06-23 NOTE — Assessment & Plan Note (Signed)
S: suspect controlled on atorvastatin 60m. No myalgias.   A/P: update direct LDL

## 2016-06-23 NOTE — Patient Instructions (Addendum)
Please stop by lab before you go  Have [facility] do a diabetic eye exam at next visit on April 12th and send [place] a copy  Try twice a week protein shake to try to help get weight back closer to 120

## 2016-06-23 NOTE — Assessment & Plan Note (Signed)
S: controlled on half tablet of valsartan-hctz 80-12.5mg  .  BP Readings from Last 3 Encounters:  06/23/16 (!) 102/56  04/21/16 124/68  04/11/16 (!) 125/48  A/P:Continue current meds:  Discussed if SBP under 110 at follow up may reduce medicine such as by stopping hctz portion

## 2016-06-23 NOTE — Progress Notes (Signed)
Subjective:  [name] is a 71 y.o. year old very pleasant female patient who presents for/with See problem oriented charting ROS- no falls. No chest pain or shortness of breath. No headache or blurry vision. Stable memory changes   Past Medical History-  Patient Active Problem List   Diagnosis Date Noted  . Mild cognitive impairment 08/09/2015    Priority: High  . Fecal incontinence 03/23/2014    Priority: High  . Diabetes mellitus type II, controlled ([place]) 10/19/2006    Priority: High  . Mobitz type II atrioventricular block 04/21/2016    Priority: Medium  . History of adenomatous polyp of colon 08/09/2015    Priority: Medium  . H/O partial thyroidectomy 03/23/2014    Priority: Medium  . Hyperlipidemia 10/19/2006    Priority: Medium  . Essential hypertension 10/19/2006    Priority: Medium  . Glaucoma 03/23/2014    Priority: Low  . Family history of malignant neoplasm of gastrointestinal tract 08/26/2010    Priority: Low  . Anemia 08/26/2010    Priority: Low  . Esophageal stricture 07/16/2010    Priority: Low  . Osteopenia 04/22/2007    Priority: Low  . Syncope and collapse 04/09/2016    Medications- reviewed and updated Current Outpatient Prescriptions  Medication Sig Dispense Refill  . aspirin 81 MG tablet Take 81 mg by mouth daily.      Marland Kitchen atorvastatin (LIPITOR) 10 MG tablet TAKE ONE TABLET BY MOUTH ONCE DAILY 90 tablet 3  . brimonidine (ALPHAGAN) 0.2 % ophthalmic solution PLACE ONE DROP IN [facility] EYE THREE TIMES DAILY  2  . Calcium Carbonate-Vit D-Min (GNP CALCIUM 1200) 1200-1000 MG-UNIT CHEW Chew 1 tablet by mouth daily.      . Carboxymethylcellulose Sodium (LUBRICANT EYE DROPS OP) Place 1 drop into both eyes at bedtime.    . donepezil (ARICEPT) 5 MG tablet TAKE ONE TABLET BY MOUTH AT BEDTIME 30 tablet 5  . glimepiride (AMARYL) 1 MG tablet TAKE ONE TABLET BY MOUTH ONCE DAILY BEFORE BREAKFAST. 90 tablet 2  . glucose blood (ONETOUCH VERIO) test strip Use to  test blood sugars daily. Dx: E11.9 100 each 11  . latanoprost (XALATAN) 0.005 % ophthalmic solution Place 1 drop into both eyes at bedtime.     Marland Kitchen loperamide (IMODIUM) 2 MG capsule Take 1 capsule (2 mg total) by mouth as needed for diarrhea or loose stools. 30 capsule 0  . Multiple Vitamins-Iron (MULTIVITAMIN/IRON) TABS Take 1 tablet by mouth daily.      [name] DELICA LANCETS 31S MISC Use to check blood sugars daily. Dx: E11.9 100 each 11  . Probiotic Product (ALIGN) 4 MG CAPS Take 1 capsule by mouth daily.      . promethazine (PHENERGAN) 12.5 MG tablet Take 1 tablet (12.5 mg total) by mouth every 6 (six) hours as needed for nausea. 30 tablet 0  . valsartan-hydrochlorothiazide (DIOVAN-HCT) 80-12.5 MG tablet Take 0.5 tablets by mouth daily. 90 tablet 2   No current facility-administered medications for this visit.     Objective: BP (!) 102/56 (BP Location: Left Arm, Patient Position: Sitting, Cuff Size: Normal)   Pulse 66   Temp 97.5 F (36.4 C) (Oral)   Ht 5\' 2"  (1.575 m)   Wt 110 lb 3.2 oz (50 kg)   SpO2 98%   BMI 20.16 kg/m  Gen: NAD, resting comfortably, thin CV: RRR no murmurs rubs or gallops Lungs: CTAB no crackles, wheeze, rhonchi Abdomen: soft/nontender/nondistended/normal bowel sounds. No rebound or guarding.  Ext: no  edema Skin: warm, dry  Diabetic Foot Exam - Simple   Simple Foot Form Visual Inspection No deformities, no ulcerations, no other skin breakdown bilaterally:  Yes Sensation Testing Intact to touch and monofilament testing bilaterally:  Yes Pulse Check Posterior Tibialis and Dorsalis pulse intact bilaterally:  Yes Comments    Assessment/Plan:  Diabetes mellitus type II, controlled ([place]) S: well controlled. On amaryl 1mg . Diarrhea on metformin CBGs- usually low 100s and no lows Exercise and diet- hAS lost weight due to norovirus Lab Results  Component Value Date   HGBA1C 6.2 12/24/2015   HGBA1C 6.4 08/09/2015   HGBA1C 6.9 (H) 06/18/2014   A/P:  no changes today, update a1c  Hyperlipidemia S: suspect controlled on atorvastatin 54m. No myalgias.   A/P: update direct LDL  Essential hypertension S: controlled on half tablet of valsartan-hctz 80-12.5mg  .  BP Readings from Last 3 Encounters:  06/23/16 (!) 102/56  04/21/16 124/68  04/11/16 (!) 125/48  A/P:Continue current meds:  Discussed if SBP under 110 at follow up may reduce medicine such as by stopping hctz portion  From avs "Have [facility] do a diabetic eye exam at next visit on April 12th and send [place] a copy"  Lost some weight from norovirus- she is going to work on doing a protein shake twice a week. We discussed may increase a1c some but she has been really well controlled so hopefully no issues Return in about 6 months (around 12/24/2016) for follow up- or sooner if needed.  Orders Placed This Encounter  Procedures  . CBC    [place]  . Comprehensive metabolic panel    [place]  . LDL cholesterol, direct    [place]  . Hemoglobin A1c    [place]   Return precautions advised.  [name], MD

## 2016-06-23 NOTE — Progress Notes (Signed)
Pre visit review using our clinic review tool, if applicable. No additional management support is needed unless otherwise documented below in the visit note. 

## 2016-07-09 ENCOUNTER — Other Ambulatory Visit: Payer: Self-pay | Admitting: Family Medicine

## 2016-09-17 ENCOUNTER — Encounter: Payer: Self-pay | Admitting: Neurology

## 2016-09-17 ENCOUNTER — Ambulatory Visit (INDEPENDENT_AMBULATORY_CARE_PROVIDER_SITE_OTHER): Payer: Medicare Other | Admitting: Neurology

## 2016-09-17 VITALS — BP 98/52 | HR 61 | Ht 62.0 in | Wt 105.0 lb

## 2016-09-17 DIAGNOSIS — F03A0 Unspecified dementia, mild, without behavioral disturbance, psychotic disturbance, mood disturbance, and anxiety: Secondary | ICD-10-CM

## 2016-09-17 DIAGNOSIS — F0390 Unspecified dementia without behavioral disturbance: Secondary | ICD-10-CM

## 2016-09-17 MED ORDER — DONEPEZIL HCL 10 MG PO TABS: ORAL_TABLET | ORAL | 3 refills | Status: DC

## 2016-09-17 NOTE — Patient Instructions (Signed)
1. Increase Aricept to 10mg  daily. With your current bottle of Aricept 5mg , take 2 tablets daily. Once done, your new bottle will be for Aricept 10mg , take 1 tablet daily. 2. Control of blood pressure, cholesterol, as well as physical exercise and brain stimulation exercises are important for brain health 3. Follow-up in 6 months, call for any changes

## 2016-09-17 NOTE — Progress Notes (Signed)
NEUROLOGY FOLLOW UP OFFICE NOTE  [name] [id number] [birth year]  HISTORY OF PRESENT ILLNESS: I had the pleasure of seeing [name] in follow-up in the neurology clinic on 09/17/2016.  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 She is again accompanied by her husband and daughter who help supplement the history today. Since her last visit, she feels her memory is not as good as it was. She continues to drive without getting lost and denies any missed bills or medications. Her husband and daughter feel her memory has digressed some, she locked herself out of the house the other day, and although she could have gotten in by disarming their alarm system, she did not remember to do this. Instead, she waited outside until family came home because she forgot her cell phone inside as well. Her daughter continues to note repeated conversations. Family has no current driving concerns. No personality changes or hallucinations. She denies any falls, walking is unchanged. No urinary incontinence. She has chronic diarrhea that has not worsened with Aricept, which she is tolerating overall. She is reporting cold intolerance. No headaches, dizziness, diplopia, focal numbness/tingling/weakness. She had a syncopal episode in October 2017, and another last January 2018, in the setting of having the norovirus.   HPI 03/19/2016: This is a pleasant 71 yo RH woman with a history of hypertension, hyperlipidemia, diabetes, fecal incontinence, who presented for evaluation of report of abnormal brain MRI ordered for memory loss. She feels her memory is not as good as it was, she has noticed a gradual decline with memory for names, dates, and conversations. 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 Her motor functions are not where they used to be.  She now questions her ability to know how to get places she has been to multiple times in the past. She left groceries in the back of her car. One time she double paid their church in one month. She denies any missed medications. Her daughter has noticed she would repeat the same conversation. She has more difficulty organizing a meal and does not cook very much anymore. They deny any gait changes, but report she has fallen a couple of times due to missteps. She denies any urinary incontinence. She has a chronic history of fecal incontinence since she delivered her children, and had a [employer] device stimulating her sphincter muscle. This has helped, but she continues to have diarrhea at least once a week. Her husband reports a weight loss of 25 lbs in the past year. Her appetite is not as good as it was. Sleep is good. She denies any headaches, vision changes, dysarthria/dysphagia, neck/back pain, focal numbness/tingling/weakness. No anosmia. She has mild tremors when anxious. They report an incident of loss of consciousness a week after her MRI, she walked into the bathroom then next thing she knew her head was on the cabinet. She was not gone for a long time per family, probably 5 minutes, but she was unable to tell them what had happened on how she hit her forehead. She may have been dizzy, denied any confusion or headache. No other episodes of loss of consciousness in the past. There is no family history of dementia. She denies any alcohol intake, no other head injuries. She was started on Aricept which she is tolerating without side effects.  I personally reviewed MRI brain with and without contrast done 10/1=5/17 which did not show any acute changes. There was mild chronic microvascular disease and mild volume loss. There was note of enlargement of the lateral and third ventricles which is borderline disproportional to the degree of parenchymal volume loss and increased from 2007. No transependymal flow  seen, patent cerebral aqueduct  PAST MEDICAL HISTORY: Past Medical History:  Diagnosis Date  . Allergy   . Arthritis    right arm  . Cataract    bilateral and removal  . Environmental allergies   . Fecal incontinence    sphincter stim placed 07-2014  . GERD (gastroesophageal reflux disease)   . Glaucoma, both eyes   . History of adenomatous polyp of colon   . History of diverticulitis    duodenal 2012-- resolved  . History of hiatal hernia   . Hyperlipidemia   . Hypertension   . Osteopenia   . Sigmoid diverticulosis   . Status post dilation of esophageal narrowing   . Thyroid disease   . Type 2 diabetes mellitus ([place])   . Wears glasses     MEDICATIONS: Current Outpatient Prescriptions on File Prior to Visit  Medication Sig Dispense Refill  . aspirin 81 MG tablet Take 81 mg by mouth daily.      Marland Kitchen atorvastatin (LIPITOR) 10 MG tablet TAKE ONE TABLET BY MOUTH ONCE DAILY 90 tablet 3  . brimonidine (ALPHAGAN) 0.2 % ophthalmic solution PLACE ONE DROP IN [facility] EYE THREE TIMES DAILY  2  . Calcium Carbonate-Vit D-Min (GNP CALCIUM 1200) 1200-1000 MG-UNIT CHEW Chew 1 tablet by mouth daily.      . Carboxymethylcellulose Sodium (LUBRICANT EYE DROPS OP) Place 1 drop into both eyes at bedtime.    . donepezil (ARICEPT) 5 MG tablet TAKE ONE TABLET BY MOUTH AT BEDTIME 30 tablet 5  . glimepiride (AMARYL) 1 MG tablet TAKE ONE TABLET BY MOUTH ONCE DAILY BEFORE BREAKFAST. 90 tablet 2  . glucose blood (ONETOUCH VERIO) test strip Use to test blood sugars daily. Dx: E11.9 100 each 11  . latanoprost (XALATAN) 0.005 % ophthalmic solution Place 1 drop into both eyes at bedtime.     Marland Kitchen loperamide (IMODIUM) 2 MG capsule Take 1 capsule (2 mg total) by mouth as needed for diarrhea or loose stools. 30 capsule 0  . Multiple Vitamins-Iron (MULTIVITAMIN/IRON) TABS Take 1 tablet by mouth daily.      [name] DELICA LANCETS 02R MISC Use to check blood sugars daily. Dx: E11.9 100 each 11  . Probiotic Product  (ALIGN) 4 MG CAPS Take 1 capsule by mouth daily.      . promethazine (PHENERGAN) 12.5 MG tablet Take 1 tablet (12.5 mg total) by mouth every 6 (six) hours as needed for nausea. 30 tablet 0  . valsartan-hydrochlorothiazide (DIOVAN-HCT) 80-12.5 MG tablet TAKE ONE-HALF TABLET BY MOUTH ONCE DAILY 45 tablet 5   No current facility-administered medications on file prior to visit.     ALLERGIES: Allergies  Allergen Reactions  . Lisinopril Diarrhea and Other (See Comments)    REACTION: hallucinations/diarrhea  . Metformin And Related Diarrhea    FAMILY HISTORY: Family History  Problem Relation Age of Onset  . Diabetes Mother   . Heart disease Mother        CABG 48 in 51  . Heart attack Mother   . Stroke Mother   . Cancer Father 61       liver  . Colon cancer Father   .  Colon cancer Maternal Grandmother   . Pancreatic cancer Maternal Grandmother   . Stomach cancer Maternal Grandmother   . Colon polyps Neg Hx   . Esophageal cancer Neg Hx   . Rectal cancer Neg Hx     SOCIAL HISTORY: Social History   Social History  . Marital status: Married    Spouse name: N/A  . Number of children: 2  . Years of education: N/A   Occupational History  . retired Retired   Social History Main Topics  . Smoking status: Never Smoker  . Smokeless tobacco: Never Used  . Alcohol use No  . Drug use: No  . Sexual activity: Not on file   Other Topics Concern  . Not on file   Social History Narrative   Married 1967. 2 children (daughters). 7 grandkids.       Retired-[employer] in [place]: reading      Daily caffeine    REVIEW OF SYSTEMS: Constitutional: No fevers, chills, or sweats, no generalized fatigue, change in appetite Eyes: No visual changes, double vision, eye pain Ear, nose and throat: No hearing loss, ear pain, nasal congestion, sore throat Cardiovascular: No chest pain, palpitations Respiratory:  No shortness of breath at rest or with exertion,  wheezes GastrointestinaI: No nausea, vomiting, diarrhea, abdominal pain, fecal incontinence Genitourinary:  No dysuria, urinary retention or frequency Musculoskeletal:  No neck pain, back pain Integumentary: No rash, pruritus, skin lesions Neurological: as above Psychiatric: No depression, insomnia, anxiety Endocrine: No palpitations, fatigue, diaphoresis, mood swings, change in appetite, change in weight, increased thirst Hematologic/Lymphatic:  No anemia, purpura, petechiae. Allergic/Immunologic: no itchy/runny eyes, nasal congestion, recent allergic reactions, rashes  PHYSICAL EXAM: Vitals:   09/17/16 1000  BP: (!) 98/52  Pulse: 61   General: No acute distress Head:  Normocephalic/atraumatic Neck: supple, no paraspinal tenderness, full range of motion Heart:  Regular rate and rhythm Lungs:  Clear to auscultation bilaterally Back: No paraspinal tenderness Skin/Extremities: No rash, no edema Neurological Exam: alert and oriented to person, place, and time. No aphasia or dysarthria. Fund of knowledge is appropriate.  Recent and remote memory are impaired  Attention and concentration are normal.    Able to name objects and repeat phrases.  Montreal Cognitive Assessment  09/17/2016 03/19/2016  Visuospatial/ Executive (0/5) 4 3  Naming (0/3) 3 3  Attention: Read list of digits (0/2) 1 1  Attention: Read list of letters (0/1) 0 1  Attention: Serial 7 subtraction starting at 100 (0/3) 2 3  Language: Repeat phrase (0/2) 2 2  Language : Fluency (0/1) 1 0  Abstraction (0/2) 2 2  Delayed Recall (0/5) 0 0  Orientation (0/6) 4 5  Total 19 20   Cranial nerves: Pupils equal, round, reactive to light.  Extraocular movements intact with no nystagmus. Visual fields full. Facial sensation intact. No facial asymmetry. Tongue, uvula, palate midline.  Motor: Bulk and tone normal, no cogwheeling, muscle strength 5/5 throughout with no pronator drift.  Sensation to light touch intact.  No extinction  to double simultaneous stimulation.  Deep tendon reflexes +1 throughout, toes downgoing.  Finger to nose testing intact.  Gait narrow-based and steady, able to tandem walk adequately. Good arm swing, no magnetic gait noted. Romberg negative. No postural instability.  IMPRESSION: This is a pleasant 71 yo RH woman with a history of hypertension, hyperlipidemia, diabetes, fecal incontinence, who presented for worsening memory with MRI brain showing diffuse atrophy and borderline disproportional enlargement of the  ventricles to the degree of parenchymal volume loss, raising the question of normal pressure hydrocephalus. She does not have any other features of NPH except for the memory changes. No magnetic gait noted, no urinary incontinence. Neurological exam is unchanged. MOCA score today 19/30 (20/30 in December 2017), history suggestive of mild dementia. She will increase Aricept to 10mg  daily. We again discussed the importance of control of vascular risk factors, physical exercise, and brain stimulation exercises for brain health. She had a syncopal episode in October 2017, then another last January 2018 in the setting of being sick with the Norovirus. Per DC summary, follow-up with cardiology for event monitor, the patient and her family tell me they were told this was not needed anymore. She will follow-up in 6 months and knows to call for any changes.   Thank you for allowing me to participate in her care.  Please do not hesitate to call for any questions or concerns.  The duration of this appointment visit was 25 minutes of face-to-face time with the patient.  Greater than 50% of this time was spent in counseling, explanation of diagnosis, planning of further management, and coordination of care.   Ellouise Newer, M.D.   CC: Dr. Yong Channel

## 2016-09-21 DIAGNOSIS — F0390 Unspecified dementia without behavioral disturbance: Secondary | ICD-10-CM | POA: Insufficient documentation

## 2016-09-21 DIAGNOSIS — F03A0 Unspecified dementia, mild, without behavioral disturbance, psychotic disturbance, mood disturbance, and anxiety: Secondary | ICD-10-CM | POA: Insufficient documentation

## 2016-10-16 ENCOUNTER — Ambulatory Visit (INDEPENDENT_AMBULATORY_CARE_PROVIDER_SITE_OTHER): Payer: Medicare Other | Admitting: Family Medicine

## 2016-10-16 ENCOUNTER — Encounter: Payer: Self-pay | Admitting: Family Medicine

## 2016-10-16 VITALS — BP 124/60 | HR 79 | Temp 97.9°F | Resp 98 | Ht 62.0 in | Wt 105.8 lb

## 2016-10-16 DIAGNOSIS — G3184 Mild cognitive impairment, so stated: Secondary | ICD-10-CM

## 2016-10-16 DIAGNOSIS — R197 Diarrhea, unspecified: Secondary | ICD-10-CM

## 2016-10-16 MED ORDER — DONEPEZIL HCL 5 MG PO TABS: 5.0000 mg | ORAL_TABLET | Freq: Every day | ORAL | 5 refills | Status: DC

## 2016-10-16 NOTE — Assessment & Plan Note (Signed)
Diarrhea S: Went back to see Dr. Delice Lesch 4-5 weeks ago and aricept was increased to 10mg  from 5mg . Since then every day except maybe 3 has had diarrhea. Can have up to 2-3 bowel movements a day but sometimes just 1- states it is "slimy" with no consistency. Imodium makes her sleepy but she wont have another bowel movement the rest of the day.   Before this has always in recent memory had loose stools/diarrhea but was only 1-2x a week. Takes align everyday already.   Wont go to church because doesn't know when she will need to go.   05/03/15 last colonoscopy with 5 year repeat. No blood in stool. No mucus. No melena.  A/P: will trial off of aricept 10mg  for 3 days then resume at 5mg  on Monday. This could be dose related diarrhea related to the aricept. She has baseline difficulties and has sacral nerve stimulator.

## 2016-10-16 NOTE — Progress Notes (Signed)
Subjective:  [name] is a 71 y.o. year old very pleasant female patient who presents for/with See problem oriented charting ROS- stomach rumbling but no significant pain, no nausea or vomiting. No fever.    Past Medical History-  Patient Active Problem List   Diagnosis Date Noted  . Mild cognitive impairment 08/09/2015    Priority: High  . Fecal incontinence 03/23/2014    Priority: High  . Diabetes mellitus type II, controlled ([place]) 10/19/2006    Priority: High  . Mobitz type II atrioventricular block 04/21/2016    Priority: Medium  . History of adenomatous polyp of colon 08/09/2015    Priority: Medium  . H/O partial thyroidectomy 03/23/2014    Priority: Medium  . Hyperlipidemia 10/19/2006    Priority: Medium  . Essential hypertension 10/19/2006    Priority: Medium  . Glaucoma 03/23/2014    Priority: Low  . Family history of malignant neoplasm of gastrointestinal tract 08/26/2010    Priority: Low  . Anemia 08/26/2010    Priority: Low  . Esophageal stricture 07/16/2010    Priority: Low  . Osteopenia 04/22/2007    Priority: Low  . Mild dementia 09/21/2016  . Syncope and collapse 04/09/2016    Medications- reviewed and updated Current Outpatient Prescriptions  Medication Sig Dispense Refill  . aspirin 81 MG tablet Take 81 mg by mouth daily.      Marland Kitchen atorvastatin (LIPITOR) 10 MG tablet TAKE ONE TABLET BY MOUTH ONCE DAILY 90 tablet 3  . brimonidine (ALPHAGAN) 0.2 % ophthalmic solution PLACE ONE DROP IN [facility] EYE THREE TIMES DAILY  2  . Calcium Carbonate-Vit D-Min (GNP CALCIUM 1200) 1200-1000 MG-UNIT CHEW Chew 1 tablet by mouth daily.      . Carboxymethylcellulose Sodium (LUBRICANT EYE DROPS OP) Place 1 drop into both eyes at bedtime.    Marland Kitchen glimepiride (AMARYL) 1 MG tablet TAKE ONE TABLET BY MOUTH ONCE DAILY BEFORE BREAKFAST. 90 tablet 2  . glucose blood (ONETOUCH VERIO) test strip Use to test blood sugars daily. Dx: E11.9 100 each 11  . latanoprost (XALATAN) 0.005 %  ophthalmic solution Place 1 drop into both eyes at bedtime.     Marland Kitchen loperamide (IMODIUM) 2 MG capsule Take 1 capsule (2 mg total) by mouth as needed for diarrhea or loose stools. 30 capsule 0  . Multiple Vitamins-Iron (MULTIVITAMIN/IRON) TABS Take 1 tablet by mouth daily.      [name] DELICA LANCETS 53Z MISC Use to check blood sugars daily. Dx: E11.9 100 each 11  . Probiotic Product (ALIGN) 4 MG CAPS Take 1 capsule by mouth daily.      . promethazine (PHENERGAN) 12.5 MG tablet Take 1 tablet (12.5 mg total) by mouth every 6 (six) hours as needed for nausea. 30 tablet 0  . valsartan-hydrochlorothiazide (DIOVAN-HCT) 80-12.5 MG tablet TAKE ONE-HALF TABLET BY MOUTH ONCE DAILY 45 tablet 5  . donepezil (ARICEPT) 5 MG tablet Take 1 tablet (5 mg total) by mouth at bedtime. 30 tablet 5   No current facility-administered medications for this visit.     Objective: BP 124/60 (BP Location: Left Arm, Patient Position: Sitting, Cuff Size: Normal)   Pulse 79   Temp 97.9 F (36.6 C) (Oral)   Resp (!) 98   Ht 5\' 2"  (1.575 m)   Wt 105 lb 12.8 oz (48 kg)   BMI 19.35 kg/m  Gen: NAD, resting comfortably Mucous membranes are moist. CV: RRR no murmurs rubs or gallops Lungs: CTAB no crackles, wheeze, rhonchi Abdomen: soft/nontender/nondistended/normal bowel  sounds. No rebound or guarding.  Ext: no edema Skin: warm, dry  Assessment/Plan:  Mild cognitive impairment Diarrhea S: Went back to see Dr. Delice Lesch 4-5 weeks ago and aricept was increased to 10mg  from 5mg . Since then every day except maybe 3 has had diarrhea. Can have up to 2-3 bowel movements a day but sometimes just 1- states it is "slimy" with no consistency. Imodium makes her sleepy but she wont have another bowel movement the rest of the day.   Before this has always in recent memory had loose stools/diarrhea but was only 1-2x a week. Takes align everyday already.   Wont go to church because doesn't know when she will need to go.   05/03/15  last colonoscopy with 5 year repeat. No blood in stool. No mucus. No melena.  A/P: will trial off of aricept 10mg  for 3 days then resume at 5mg  on Monday. This could be dose related diarrhea related to the aricept. She has baseline difficulties and has sacral nerve stimulator.   Meds ordered this encounter  Medications  . donepezil (ARICEPT) 5 MG tablet    Sig: Take 1 tablet (5 mg total) by mouth at bedtime.    Dispense:  30 tablet    Refill:  5   Return precautions advised.  [name], MD

## 2016-10-16 NOTE — Patient Instructions (Signed)
Hold aricept until Monday  Then take 5mg  dose each evening  I will update Dr. Delice Lesch to make  Sure she is onboard with this plan  Hope this helps!   Have you had this? Health Maintenance Due  Topic Date Due  . OPHTHALMOLOGY EXAM  01/02/2016

## 2016-12-10 ENCOUNTER — Other Ambulatory Visit: Payer: Self-pay | Admitting: Family Medicine

## 2016-12-14 DIAGNOSIS — H401131 Primary open-angle glaucoma, bilateral, mild stage: Secondary | ICD-10-CM | POA: Diagnosis not present

## 2016-12-14 DIAGNOSIS — E119 Type 2 diabetes mellitus without complications: Secondary | ICD-10-CM | POA: Diagnosis not present

## 2016-12-14 DIAGNOSIS — Z961 Presence of intraocular lens: Secondary | ICD-10-CM | POA: Diagnosis not present

## 2016-12-14 LAB — HM DIABETES EYE EXAM

## 2016-12-24 ENCOUNTER — Encounter: Payer: Self-pay | Admitting: Family Medicine

## 2016-12-24 ENCOUNTER — Ambulatory Visit (INDEPENDENT_AMBULATORY_CARE_PROVIDER_SITE_OTHER): Payer: Medicare Other | Admitting: Family Medicine

## 2016-12-24 VITALS — BP 128/68 | HR 65 | Temp 97.7°F | Ht 62.0 in | Wt 109.6 lb

## 2016-12-24 DIAGNOSIS — Z23 Encounter for immunization: Secondary | ICD-10-CM

## 2016-12-24 DIAGNOSIS — E119 Type 2 diabetes mellitus without complications: Secondary | ICD-10-CM | POA: Diagnosis not present

## 2016-12-24 DIAGNOSIS — F0390 Unspecified dementia without behavioral disturbance: Secondary | ICD-10-CM

## 2016-12-24 DIAGNOSIS — E785 Hyperlipidemia, unspecified: Secondary | ICD-10-CM | POA: Diagnosis not present

## 2016-12-24 DIAGNOSIS — F03A0 Unspecified dementia, mild, without behavioral disturbance, psychotic disturbance, mood disturbance, and anxiety: Secondary | ICD-10-CM

## 2016-12-24 DIAGNOSIS — I10 Essential (primary) hypertension: Secondary | ICD-10-CM | POA: Diagnosis not present

## 2016-12-24 DIAGNOSIS — R6889 Other general symptoms and signs: Secondary | ICD-10-CM

## 2016-12-24 LAB — CBC
HCT: 38 % (ref 36.0–46.0)
Hemoglobin: 12.9 g/dL (ref 12.0–15.0)
MCHC: 34 g/dL (ref 30.0–36.0)
MCV: 89.4 fl (ref 78.0–100.0)
Platelets: 304 10*3/uL (ref 150.0–400.0)
RBC: 4.25 Mil/uL (ref 3.87–5.11)
RDW: 13 % (ref 11.5–15.5)
WBC: 8.1 10*3/uL (ref 4.0–10.5)

## 2016-12-24 LAB — BASIC METABOLIC PANEL
BUN: 15 mg/dL (ref 6–23)
CALCIUM: 9.8 mg/dL (ref 8.4–10.5)
CHLORIDE: 101 meq/L (ref 96–112)
CO2: 33 mEq/L — ABNORMAL HIGH (ref 19–32)
CREATININE: 0.8 mg/dL (ref 0.40–1.20)
GFR: 74.93 mL/min (ref >60.00)
Glucose, Bld: 74 mg/dL (ref 70–99)
Potassium: 3.8 mEq/L (ref 3.5–5.1)
Sodium: 142 mEq/L (ref 135–145)

## 2016-12-24 LAB — HEMOGLOBIN A1C: HEMOGLOBIN A1C: 6 % (ref 4.6–6.5)

## 2016-12-24 LAB — TSH: TSH: 2.36 u[IU]/mL (ref 0.35–4.50)

## 2016-12-24 MED ORDER — LOSARTAN POTASSIUM-HCTZ 50-12.5 MG PO TABS: 0.5000 | ORAL_TABLET | Freq: Every day | ORAL | 3 refills | Status: DC

## 2016-12-24 NOTE — Assessment & Plan Note (Signed)
Atorvastatin 10mg  daily controlling LDL well. Wants to trial off statin and see where lipids are. Husband had severe reaction in the past to an old statin- he is firmly against them. Willing to restart if numbers go up great deal- we will check that next visit

## 2016-12-24 NOTE — Assessment & Plan Note (Signed)
S:  controlled. On amaryl 1mg . Metformin = diarrhea.  CBGs-  typically low 100s. Denies hypoglycemia.  Lab Results  Component Value Date   HGBA1C 6.2 06/23/2016   HGBA1C 6.2 12/24/2015   HGBA1C 6.4 08/09/2015   A/P:  update a1c and bmet today. Can see eye exam from 12/14/16 from [place] where they reported DM without ophthalmic manifestations- therefore no retinopathy

## 2016-12-24 NOTE — Assessment & Plan Note (Signed)
S: controlled on  half tablet of valsartan HCT 80-12.5mg . We discussed recall- will change med BP Readings from Last 3 Encounters:  12/24/16 128/68  10/16/16 124/60  09/17/16 (!) 98/52  A/P: We discussed blood pressure goal of <140/90. Stop valsartan HCTZ. Start half tablet of losartan HCTZ. If any lightheadedness or blood pressure below 100 or above 140 on top number on home readings- they will let me know

## 2016-12-24 NOTE — Assessment & Plan Note (Signed)
S:  last visit we decreased Aricept to 5mg  from 10mg . On 10mg  was having 2-3 BMs a day, slimy and just nto tolerable for her- though imodium would help. Before increase was only 1-2x a week with change in bowel habits. Was already on align daily .Was avoiding going to church because of bowel issues.  A/P: continue current rx Has follow up in December with Dr. Delice Lesch

## 2016-12-24 NOTE — Progress Notes (Signed)
Subjective:  [name] is a 71 y.o. year old very pleasant female patient who presents for/with See problem oriented charting ROS- No chest pain or shortness of breath. No headache or blurry vision. No hypoglycemia   Past Medical History-  Patient Active Problem List   Diagnosis Date Noted  . Mild dementia 08/09/2015    Priority: High  . Fecal incontinence 03/23/2014    Priority: High  . Diabetes mellitus type II, controlled ([place]) 10/19/2006    Priority: High  . Mobitz type II atrioventricular block 04/21/2016    Priority: Medium  . History of adenomatous polyp of colon 08/09/2015    Priority: Medium  . H/O partial thyroidectomy 03/23/2014    Priority: Medium  . Hyperlipidemia 10/19/2006    Priority: Medium  . Essential hypertension 10/19/2006    Priority: Medium  . Glaucoma 03/23/2014    Priority: Low  . Family history of malignant neoplasm of gastrointestinal tract 08/26/2010    Priority: Low  . Anemia 08/26/2010    Priority: Low  . Esophageal stricture 07/16/2010    Priority: Low  . Osteopenia 04/22/2007    Priority: Low  . Syncope and collapse 04/09/2016    Medications- reviewed and updated Current Outpatient Prescriptions  Medication Sig Dispense Refill  . aspirin 81 MG tablet Take 81 mg by mouth daily.      Marland Kitchen atorvastatin (LIPITOR) 10 MG tablet TAKE ONE TABLET BY MOUTH ONCE DAILY 90 tablet 3  . brimonidine (ALPHAGAN) 0.2 % ophthalmic solution PLACE ONE DROP IN [facility] EYE THREE TIMES DAILY  2  . Calcium Carbonate-Vit D-Min (GNP CALCIUM 1200) 1200-1000 MG-UNIT CHEW Chew 1 tablet by mouth daily.      . Carboxymethylcellulose Sodium (LUBRICANT EYE DROPS OP) Place 1 drop into both eyes at bedtime.    . donepezil (ARICEPT) 5 MG tablet Take 1 tablet (5 mg total) by mouth at bedtime. 30 tablet 5  . glimepiride (AMARYL) 1 MG tablet TAKE ONE TABLET BY MOUTH ONCE DAILY BEFORE BREAKFAST. 90 tablet 2  . glucose blood (ONETOUCH VERIO) test strip Use to test blood sugars  daily. Dx: E11.9 100 each 11  . latanoprost (XALATAN) 0.005 % ophthalmic solution Place 1 drop into both eyes at bedtime.     Marland Kitchen loperamide (IMODIUM) 2 MG capsule Take 1 capsule (2 mg total) by mouth as needed for diarrhea or loose stools. 30 capsule 0  . Multiple Vitamins-Iron (MULTIVITAMIN/IRON) TABS Take 1 tablet by mouth daily.      [name] DELICA LANCETS 82N MISC Use to check blood sugars daily. Dx: E11.9 100 each 11  . Probiotic Product (ALIGN) 4 MG CAPS Take 1 capsule by mouth daily.      . promethazine (PHENERGAN) 12.5 MG tablet Take 1 tablet (12.5 mg total) by mouth every 6 (six) hours as needed for nausea. 30 tablet 0  . valsartan-hydrochlorothiazide (DIOVAN-HCT) 80-12.5 MG tablet TAKE ONE-HALF TABLET BY MOUTH ONCE DAILY 45 tablet 5   Objective: BP 128/68 (BP Location: Left Arm, Patient Position: Sitting, Cuff Size: Normal)   Pulse 65   Temp 97.7 F (36.5 C) (Oral)   Ht 5\' 2"  (1.575 m)   Wt 109 lb 9.6 oz (49.7 kg)   SpO2 99%   BMI 20.05 kg/m  Gen: NAD, resting comfortably CV: RRR no murmurs rubs or gallops Lungs: CTAB no crackles, wheeze, rhonchi Ext: no edema Skin: warm, dry Neuro: normal mentation in visit today- alert and oriented x 4  Assessment/Plan: Cold intolerance- will check cbc  and tsh  Trial flonase for sinus congestion  Diabetes mellitus type II, controlled ([place]) S:  controlled. On amaryl 1mg . Metformin = diarrhea.  CBGs-  typically low 100s. Denies hypoglycemia.  Lab Results  Component Value Date   HGBA1C 6.2 06/23/2016   HGBA1C 6.2 12/24/2015   HGBA1C 6.4 08/09/2015   A/P:  update a1c and bmet today. Can see eye exam from 12/14/16 from [facility] where they reported DM without ophthalmic manifestations- therefore no retinopathy   Mild dementia S:  last visit we decreased Aricept to 5mg  from 10mg . On 10mg  was having 2-3 BMs a day, slimy and just nto tolerable for her- though imodium would help. Before increase was only 1-2x a week with change in bowel  habits. Was already on align daily .Was avoiding going to church because of bowel issues.  A/P: continue current rx Has follow up in December with Dr. Delice Lesch  Hyperlipidemia Atorvastatin 10mg  daily controlling LDL well. Wants to trial off statin and see where lipids are. Husband had severe reaction in the past to an old statin- he is firmly against them. Willing to restart if numbers go up great deal- we will check that next visit    Essential hypertension S: controlled on  half tablet of valsartan HCT 80-12.5mg . We discussed recall- will change med BP Readings from Last 3 Encounters:  12/24/16 128/68  10/16/16 124/60  09/17/16 (!) 98/52  A/P: We discussed blood pressure goal of <140/90. Stop valsartan HCTZ. Start half tablet of losartan HCTZ. If any lightheadedness or blood pressure below 100 or above 140 on top number on home readings- they will let me know   Future Appointments Date Time Provider [place]  03/24/2017 10:00 AM Cameron Sprang, MD [facility] None   Return in about 14 weeks (around 04/01/2017).  Orders Placed This Encounter  Procedures  . Flu vaccine HIGH DOSE PF  . Basic metabolic panel    [place]  . Hemoglobin A1c    [place]  . TSH    [place]  . CBC    [place]  . HM DIABETES EYE EXAM    This external order was created through the Results Console.    Meds ordered this encounter  Medications  . losartan-hydrochlorothiazide (HYZAAR) 50-12.5 MG tablet    Sig: Take 0.5 tablets by mouth daily.    Dispense:  45 tablet    Refill:  3   Return precautions advised.  [name], MD

## 2016-12-24 NOTE — Patient Instructions (Addendum)
Stop valsartan HCTZ. Start half tablet of losartan HCTZ. If any lightheadedness or blood pressure below 100 or above 140 on top number on home readings- they will let me know   You have requested to stop atorvastatin. We will see where numbers are off this medicine next time.   Please stop by lab before you go  Flu shot before you leave this room

## 2017-01-21 ENCOUNTER — Other Ambulatory Visit: Payer: Self-pay | Admitting: Family Medicine

## 2017-03-24 ENCOUNTER — Ambulatory Visit (INDEPENDENT_AMBULATORY_CARE_PROVIDER_SITE_OTHER): Payer: Medicare Other | Admitting: Neurology

## 2017-03-24 ENCOUNTER — Encounter: Payer: Self-pay | Admitting: Neurology

## 2017-03-24 VITALS — BP 110/56 | HR 64 | Ht 61.0 in | Wt 107.0 lb

## 2017-03-24 DIAGNOSIS — F0390 Unspecified dementia without behavioral disturbance: Secondary | ICD-10-CM | POA: Diagnosis not present

## 2017-03-24 DIAGNOSIS — F320 Major depressive disorder, single episode, mild: Secondary | ICD-10-CM | POA: Insufficient documentation

## 2017-03-24 DIAGNOSIS — F03A0 Unspecified dementia, mild, without behavioral disturbance, psychotic disturbance, mood disturbance, and anxiety: Secondary | ICD-10-CM

## 2017-03-24 MED ORDER — ESCITALOPRAM OXALATE 5 MG PO TABS: 5.0000 mg | ORAL_TABLET | Freq: Every day | ORAL | 6 refills | Status: DC

## 2017-03-24 MED ORDER — RIVASTIGMINE 4.6 MG/24HR TD PT24: 4.6000 mg | MEDICATED_PATCH | Freq: Every day | TRANSDERMAL | 12 refills | Status: DC

## 2017-03-24 NOTE — Patient Instructions (Addendum)
1. Start Lexapro 5mg  daily. 2. Continue Aricept 5mg  daily for another 2 weeks, then stop medication. Wait another 2 weeks, then start the Exelon (rivastigmine) patch and apply every 24hours 3. Follow-up in 6 months, call for any changes  FALL PRECAUTIONS: Be cautious when walking. Scan the area for obstacles that may increase the risk of trips and falls. When getting up in the mornings, sit up at the edge of the bed for a few minutes before getting out of bed. Consider elevating the bed at the head end to avoid drop of blood pressure when getting up. Walk always in a well-lit room (use night lights in the walls). Avoid area rugs or power cords from appliances in the middle of the walkways. Use a walker or a cane if necessary and consider physical therapy for balance exercise. Get your eyesight checked regularly.  FINANCIAL OVERSIGHT: Supervision, especially oversight when making financial decisions or transactions is also recommended.  HOME SAFETY: Consider the safety of the kitchen when operating appliances like stoves, microwave oven, and blender. Consider having supervision and share cooking responsibilities until no longer able to participate in those. Accidents with firearms and other hazards in the house should be identified and addressed as well.  DRIVING: Regarding driving, in patients with progressive memory problems, driving will be impaired. We advise to have someone else do the driving if trouble finding directions or if minor accidents are reported. Independent driving assessment is available to determine safety of driving.  ABILITY TO BE LEFT ALONE: If patient is unable to contact 911 operator, consider using LifeLine, or when the need is there, arrange for someone to stay with patients. Smoking is a fire hazard, consider supervision or cessation. Risk of wandering should be assessed by caregiver and if detected at any point, supervision and safe proof recommendations should be  instituted.  MEDICATION SUPERVISION: Inability to self-administer medication needs to be constantly addressed. Implement a mechanism to ensure safe administration of the medications.  RECOMMENDATIONS FOR ALL PATIENTS WITH MEMORY PROBLEMS: 1. Continue to exercise (Recommend 30 minutes of walking everyday, or 3 hours every week) 2. Increase social interactions - continue going to [place] and enjoy social gatherings with friends and family 3. Eat healthy, avoid fried foods and eat more fruits and vegetables 4. Maintain adequate blood pressure, blood sugar, and blood cholesterol level. Reducing the risk of stroke and cardiovascular disease also helps promoting better memory. 5. Avoid stressful situations. Live a simple life and avoid aggravations. Organize your time and prepare for the next day in anticipation. 6. Sleep well, avoid any interruptions of sleep and avoid any distractions in the bedroom that may interfere with adequate sleep quality 7. Avoid sugar, avoid sweets as there is a strong link between excessive sugar intake, diabetes, and cognitive impairment We discussed the Mediterranean diet, which has been shown to help patients reduce the risk of progressive memory disorders and reduces cardiovascular risk. This includes eating fish, eat fruits and green leafy vegetables, nuts like almonds and hazelnuts, walnuts, and also use olive oil. Avoid fast foods and fried foods as much as possible. Avoid sweets and sugar as sugar use has been linked to worsening of memory function.  There is always a concern of gradual progression of memory problems. If this is the case, then we may need to adjust level of care according to patient needs. Support, both to the patient and caregiver, should then be put into place.

## 2017-03-24 NOTE — Progress Notes (Signed)
NEUROLOGY FOLLOW UP OFFICE NOTE  [name] [id number] Aug 10, 1945  HISTORY OF PRESENT ILLNESS: I had the pleasure of seeing [name] in follow-up in the neurology clinic on 03/24/2017.  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 MOCA score in June 2018 was 19/30. She is again accompanied by her husband and daughter who help supplement the history today. Since her last visit, she reports her memory is not as good. She denies misplacing things, but her husband reminds her she misplaces things quite a bit, she lost her billfold the other day, they found it in a box that was knocked on the floor. She gets emotional when things like this happen. Yesterday he asked her to get batteries and she went to the wrong closet. She has stopped driving, denies getting lost but states she "just does not want to drive." She denies any missed bills or medications. She has a checklist that she goes over religiously for her medications, but her daughter is concerned that all her pills are mixed together in the pillbox and she cannot identify which pill is which. Her daughter's main concern is that she is more emotional and easily frustrated. On her last visit, Aricept dose was increased to 10mg , but she had significant diarrhea 17 out of 19 days. With dose reduction back to 5mg , she is back to her baseline GI consistency (she has a history of chronic fecal incontinence). She reports diarrhea is everyday, but her husband reports it is 2-3 times a week. She denies any headaches, dizziness, diplopia, focal numbness/tingling/weakness, no falls.   HPI 03/19/2016: This is a pleasant 71 yo RH woman with a history of hypertension, hyperlipidemia, diabetes, fecal incontinence, who presented for evaluation of report of abnormal brain MRI ordered for memory loss. She feels her memory is not as good as it was, she has noticed a gradual decline with memory for names, dates, and conversations. 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 Her motor functions are not where they used to be. She now questions her ability to know how to get places she has been to multiple times in the past. She left groceries in the back of her car. One time she double paid their church in one month. She denies any missed medications. Her daughter has noticed she would repeat the same conversation. She has more difficulty organizing a meal and does not cook very much anymore. They deny any gait changes, but report she has fallen a couple of times due to missteps. She denies any urinary incontinence. She has a chronic history of fecal incontinence since she delivered her children, and had a [employer]tr[employer] device stimulating her sphincter muscle. This has helped, but she continues to have diarrhea at least once a week. Her husband reports a weight loss of 25 lbs in the past year. Her appetite is not as good as it was. Sleep is good. She denies any headaches, vision changes, dysarthria/dysphagia, neck/back pain, focal numbness/tingling/weakness. No anosmia. She has mild tremors when anxious. They report an incident of loss of consciousness a week after her MRI, she walked into the bathroom then next thing she knew her head was on the cabinet. She was not gone for a long time per family, probably 5 minutes, but she was unable to tell them what had happened on how she hit her forehead. She may have  been dizzy, denied any confusion or headache. No other episodes of loss of consciousness in the past. There is no family history of dementia. She denies any alcohol intake, no other head injuries. She was started on Aricept which she is tolerating without side effects.  I personally reviewed MRI brain with and without contrast done 10/1=5/17 which did not show any acute changes. There was mild chronic microvascular disease and mild volume loss. There was note  of enlargement of the lateral and third ventricles which is borderline disproportional to the degree of parenchymal volume loss and increased from 2007. No transependymal flow seen, patent cerebral aqueduct  PAST MEDICAL HISTORY: Past Medical History:  Diagnosis Date  . Allergy   . Arthritis    right arm  . Cataract    bilateral and removal  . Environmental allergies   . Fecal incontinence    sphincter stim placed 07-2014  . GERD (gastroesophageal reflux disease)   . Glaucoma, both eyes   . History of adenomatous polyp of colon   . History of diverticulitis    duodenal 2012-- resolved  . History of hiatal hernia   . Hyperlipidemia   . Hypertension   . Osteopenia   . Sigmoid diverticulosis   . Status post dilation of esophageal narrowing   . Thyroid disease   . Type 2 diabetes mellitus ([place])   . Wears glasses     MEDICATIONS: Current Outpatient Medications on File Prior to Visit  Medication Sig Dispense Refill  . aspirin 81 MG tablet Take 81 mg by mouth daily.      Marland Kitchen atorvastatin (LIPITOR) 10 MG tablet TAKE ONE TABLET BY MOUTH ONCE DAILY 90 tablet 3  . brimonidine (ALPHAGAN) 0.2 % ophthalmic solution PLACE ONE DROP IN [facility] EYE THREE TIMES DAILY  2  . Calcium Carbonate-Vit D-Min (GNP CALCIUM 1200) 1200-1000 MG-UNIT CHEW Chew 1 tablet by mouth daily.      . Carboxymethylcellulose Sodium (LUBRICANT EYE DROPS OP) Place 1 drop into both eyes at bedtime.    . donepezil (ARICEPT) 5 MG tablet Take 1 tablet (5 mg total) by mouth at bedtime. 30 tablet 5  . glimepiride (AMARYL) 1 MG tablet TAKE ONE TABLET BY MOUTH ONCE DAILY BEFORE BREAKFAST 90 tablet 2  . glucose blood (ONETOUCH VERIO) test strip Use to test blood sugars daily. Dx: E11.9 100 each 11  . latanoprost (XALATAN) 0.005 % ophthalmic solution Place 1 drop into both eyes at bedtime.     Marland Kitchen loperamide (IMODIUM) 2 MG capsule Take 1 capsule (2 mg total) by mouth as needed for diarrhea or loose stools. 30 capsule 0  .  losartan-hydrochlorothiazide (HYZAAR) 50-12.5 MG tablet Take 0.5 tablets by mouth daily. 45 tablet 3  . Multiple Vitamins-Iron (MULTIVITAMIN/IRON) TABS Take 1 tablet by mouth daily.      [name] DELICA LANCETS 42H MISC Use to check blood sugars daily. Dx: E11.9 100 each 11  . Probiotic Product (ALIGN) 4 MG CAPS Take 1 capsule by mouth daily.      . promethazine (PHENERGAN) 12.5 MG tablet Take 1 tablet (12.5 mg total) by mouth every 6 (six) hours as needed for nausea. 30 tablet 0  . valsartan-hydrochlorothiazide (DIOVAN-HCT) 80-12.5 MG tablet TAKE ONE-HALF TABLET BY MOUTH ONCE DAILY 45 tablet 5   No current facility-administered medications on file prior to visit.     ALLERGIES: Allergies  Allergen Reactions  . Lisinopril Diarrhea and Other (See Comments)    REACTION: hallucinations/diarrhea  . Metformin And Related Diarrhea  FAMILY HISTORY: Family History  Problem Relation Age of Onset  . Diabetes Mother   . Heart disease Mother        CABG 15 in 15  . Heart attack Mother   . Stroke Mother   . Cancer Father 87       liver  . Colon cancer Father   . Colon cancer Maternal Grandmother   . Pancreatic cancer Maternal Grandmother   . Stomach cancer Maternal Grandmother   . Colon polyps Neg Hx   . Esophageal cancer Neg Hx   . Rectal cancer Neg Hx     SOCIAL HISTORY: Social History   Socioeconomic History  . Marital status: Married    Spouse name: Not on file  . Number of children: 2  . Years of education: Not on file  . Highest education level: Not on file  Social Needs  . Financial resource strain: Not on file  . Food insecurity - worry: Not on file  . Food insecurity - inability: Not on file  . Transportation needs - medical: Not on file  . Transportation needs - non-medical: Not on file  Occupational History  . Occupation: retired    Fish farm manager: RETIRED  Tobacco Use  . Smoking status: Never Smoker  . Smokeless tobacco: Never Used  Substance and Sexual  Activity  . Alcohol use: No    Alcohol/week: 0.0 oz  . Drug use: No  . Sexual activity: Not on file  Other Topics Concern  . Not on file  Social History Narrative   Married 1967. 2 children (daughters). 7 grandkids.       Retired-[employer] in [place]: reading      Daily caffeine    REVIEW OF SYSTEMS: Constitutional: No fevers, chills, or sweats, no generalized fatigue, change in appetite Eyes: No visual changes, double vision, eye pain Ear, nose and throat: No hearing loss, ear pain, nasal congestion, sore throat Cardiovascular: No chest pain, palpitations Respiratory:  No shortness of breath at rest or with exertion, wheezes GastrointestinaI: No nausea, vomiting, diarrhea, abdominal pain,+ fecal incontinence Genitourinary:  No dysuria, urinary retention or frequency Musculoskeletal:  No neck pain, back pain Integumentary: No rash, pruritus, skin lesions Neurological: as above Psychiatric: No depression, insomnia, anxiety Endocrine: No palpitations, fatigue, diaphoresis, mood swings, change in appetite, change in weight, increased thirst Hematologic/Lymphatic:  No anemia, purpura, petechiae. Allergic/Immunologic: no itchy/runny eyes, nasal congestion, recent allergic reactions, rashes  PHYSICAL EXAM: Vitals:   03/24/17 1013  BP: (!) 110/56  Pulse: 64  SpO2: 97%   General: No acute distress Head:  Normocephalic/atraumatic Neck: supple, no paraspinal tenderness, full range of motion Heart:  Regular rate and rhythm Lungs:  Clear to auscultation bilaterally Back: No paraspinal tenderness Skin/Extremities: No rash, no edema Neurological Exam: alert and oriented to person, place, and time. No aphasia or dysarthria. Fund of knowledge is appropriate.  Recent and remote memory are impaired  Attention and concentration are normal.    Able to name objects and repeat phrases. CDT 4/5 MMSE - Mini Mental State Exam 03/24/2017  Orientation to time 4    Orientation to Place 4  Registration 3  Attention/ Calculation 0  Recall 0  Language- name 2 objects 2  Language- repeat 1  Language- follow 3 step command 3  Language- read & follow direction 1  Write a sentence 1  Copy design 0  Total score 19    Montreal Cognitive Assessment  09/17/2016 03/19/2016  Visuospatial/  Executive (0/5) 4 3  Naming (0/3) 3 3  Attention: Read list of digits (0/2) 1 1  Attention: Read list of letters (0/1) 0 1  Attention: Serial 7 subtraction starting at 100 (0/3) 2 3  Language: Repeat phrase (0/2) 2 2  Language : Fluency (0/1) 1 0  Abstraction (0/2) 2 2  Delayed Recall (0/5) 0 0  Orientation (0/6) 4 5  Total 19 20   Cranial nerves: Pupils equal, round, reactive to light.  Extraocular movements intact with no nystagmus. Visual fields full. Facial sensation intact. No facial asymmetry. Tongue, uvula, palate midline.  Motor: Bulk and tone normal, no cogwheeling, muscle strength 5/5 throughout with no pronator drift.  Sensation to light touch intact.  No extinction to double simultaneous stimulation.  Deep tendon reflexes +1 throughout, toes downgoing.  Finger to nose testing intact.  Gait narrow-based and steady, able to tandem walk adequately. Fair arm swing, no magnetic gait noted. Romberg negative. No postural instability.  IMPRESSION: This is a pleasant 71 yo RH woman with a history of hypertension, hyperlipidemia, diabetes, fecal incontinence, who presented for worsening memory with MRI brain showing diffuse atrophy and borderline disproportional enlargement of the ventricles to the degree of parenchymal volume loss. There was question about normal pressure hydrocephalus, however she does not have any other features of NPH except for the memory changes. No magnetic gait noted, no urinary incontinence. Neurological exam is unchanged. MMSE today 19/30, indicating mild dementia. She had side effects on Aricept 10mg  daily, and still reports daily diarrhea on 5mg   dose (although does have a history of fecal incontinence). Family is also concerned about worsening emotionality. We discussed mood changes that can occur with dementia, they are agreeable to starting low dose Lexapro 5mg  daily, side effects were discussed. We will plan to switch over to Rivastigmine patch for the dementia, this may not cause similar GI issues due to different delivery system. She will start the Lexapro first, then after 2 weeks stop Aricept. Monitor if any improvement in GI symptoms. After 2 weeks off Aricept, she will start Exelon patch. Side effects were discussed. She will follow-up in 6 months and knows to call for any changes.   Thank you for allowing me to participate in her care.  Please do not hesitate to call for any questions or concerns.  The duration of this appointment visit was 25 minutes of face-to-face time with the patient.  Greater than 50% of this time was spent in counseling, explanation of diagnosis, planning of further management, and coordination of care.   Ellouise Newer, M.D.   CC: Dr. Yong Channel

## 2017-04-19 DIAGNOSIS — E119 Type 2 diabetes mellitus without complications: Secondary | ICD-10-CM | POA: Diagnosis not present

## 2017-04-19 DIAGNOSIS — H401131 Primary open-angle glaucoma, bilateral, mild stage: Secondary | ICD-10-CM | POA: Diagnosis not present

## 2017-04-19 DIAGNOSIS — Z961 Presence of intraocular lens: Secondary | ICD-10-CM | POA: Diagnosis not present

## 2017-04-19 LAB — HM DIABETES EYE EXAM

## 2017-04-21 ENCOUNTER — Encounter: Payer: Self-pay | Admitting: Family Medicine

## 2017-05-16 ENCOUNTER — Encounter: Payer: Self-pay | Admitting: Neurology

## 2017-05-17 ENCOUNTER — Encounter: Payer: Self-pay | Admitting: Neurology

## 2017-05-19 ENCOUNTER — Other Ambulatory Visit: Payer: Self-pay

## 2017-05-19 MED ORDER — MEMANTINE HCL 10 MG PO TABS: 10.0000 mg | ORAL_TABLET | Freq: Every day | ORAL | 11 refills | Status: DC

## 2017-07-13 ENCOUNTER — Ambulatory Visit (INDEPENDENT_AMBULATORY_CARE_PROVIDER_SITE_OTHER): Payer: Medicare Other | Admitting: Physician Assistant

## 2017-07-13 ENCOUNTER — Encounter: Payer: Self-pay | Admitting: Physician Assistant

## 2017-07-13 VITALS — BP 110/60 | HR 74 | Temp 98.3°F | Ht 61.0 in | Wt 108.0 lb

## 2017-07-13 DIAGNOSIS — J069 Acute upper respiratory infection, unspecified: Secondary | ICD-10-CM

## 2017-07-13 MED ORDER — AMOXICILLIN-POT CLAVULANATE 875-125 MG PO TABS: 1.0000 | ORAL_TABLET | Freq: Two times a day (BID) | ORAL | 0 refills | Status: DC

## 2017-07-13 NOTE — Patient Instructions (Signed)
It was great to see you!  Use medication as prescribed: Augmentin antibiotic  Push fluids and get plenty of rest. Please return if you are not improving as expected, or if you have high fevers (>101.5) or difficulty swallowing or worsening productive cough.  Call clinic with questions.  I hope you start feeling better soon!   

## 2017-07-13 NOTE — Progress Notes (Signed)
[name] is a 72 y.o. female here for a new problem.  I acted as a Education administrator for [employer], PA-C Anselmo Pickler, [name]  History of Present Illness:   Chief Complaint  Patient presents with  . Sinus Problem     Sinus Problem  This is a new problem. The current episode started in the past 7 days. The problem has been gradually worsening since onset. There has been no fever. Her pain is at a severity of 2/10. The pain is mild. Associated symptoms include congestion, coughing, a hoarse voice, sinus pressure, sneezing and a sore throat. Pertinent negatives include no chills, ear pain, headaches, neck pain, shortness of breath or swollen glands. Treatments tried: Mucinex x 2 days. The treatment provided mild relief.   Her husband has been sick, he required a z-pack last week. No recent travel. Has tried Mucinex without relief. Appetite is good. Feels well hydrated. Trying to push fluids.  Past Medical History:  Diagnosis Date  . Allergy   . Arthritis    right arm  . Cataract    bilateral and removal  . Environmental allergies   . Fecal incontinence    sphincter stim placed 07-2014  . GERD (gastroesophageal reflux disease)   . Glaucoma, both eyes   . History of adenomatous polyp of colon   . History of diverticulitis    duodenal 2012-- resolved  . History of hiatal hernia   . Hyperlipidemia   . Hypertension   . Osteopenia   . Sigmoid diverticulosis   . Status post dilation of esophageal narrowing   . Thyroid disease   . Type 2 diabetes mellitus ([place])   . Wears glasses      Social History   Socioeconomic History  . Marital status: Married    Spouse name: Not on file  . Number of children: 2  . Years of education: Not on file  . Highest education level: Not on file  Occupational History  . Occupation: retired    Fish farm manager: RETIRED  Social Needs  . Financial resource strain: Not on file  . Food insecurity:    Worry: Not on file    Inability: Not on file  .  Transportation needs:    Medical: Not on file    Non-medical: Not on file  Tobacco Use  . Smoking status: Never Smoker  . Smokeless tobacco: Never Used  Substance and Sexual Activity  . Alcohol use: No    Alcohol/week: 0.0 oz  . Drug use: No  . Sexual activity: Not on file  Lifestyle  . Physical activity:    Days per week: Not on file    Minutes per session: Not on file  . Stress: Not on file  Relationships  . Social connections:    Talks on phone: Not on file    Gets together: Not on file    Attends religious service: Not on file    Active member of club or organization: Not on file    Attends meetings of clubs or organizations: Not on file    Relationship status: Not on file  . Intimate partner violence:    Fear of current or ex partner: Not on file    Emotionally abused: Not on file    Physically abused: Not on file    Forced sexual activity: Not on file  Other Topics Concern  . Not on file  Social History Narrative   Married 1967. 2 children (daughters). 7 grandkids.  Retired-[employer] in [place]: reading      Daily caffeine    Past Surgical History:  Procedure Laterality Date  . ANAL RECTAL MANOMETRY N/A 03/20/2013  . BUNIONECTOMY Bilateral 1985  . CARDIOVASCULAR STRESS TEST  02-13-2005   normal perfusion study/  no ischemia/  normal LV function and wall motion , ef 86%  . CARPAL TUNNEL RELEASE Left 08-05-2006  . CATARACT EXTRACTION    . COLONOSCOPY    . COLONOSCOPY W/ POLYPECTOMY  last one 02-13-2010  . ESOPHAGOGASTRODUODENOSCOPY (EGD) WITH ESOPHAGEAL DILATION  last one 09-11-2010  . KNEE ARTHROSCOPY Right 2013  . LAPAROSCOPIC NISSEN FUNDOPLICATION  1607  . peripheral neuro stimulator  07-2014   sphincter stimulator  . POLYPECTOMY    . RECTAL ULTRASOUND N/A 03/20/2013   nerve damage  . RETINAL DETACHMENT SURGERY  2008  . THYROIDECTOMY  1979   partial  . TONSILLECTOMY AND ADENOIDECTOMY  as child  . TRIGGER FINGER RELEASE   1999  . TUBAL LIGATION  1976  . VAGINAL HYSTERECTOMY  1983    Family History  Problem Relation Age of Onset  . Diabetes Mother   . Heart disease Mother        CABG 52 in 69  . Heart attack Mother   . Stroke Mother   . Cancer Father 48       liver  . Colon cancer Father   . Colon cancer Maternal Grandmother   . Pancreatic cancer Maternal Grandmother   . Stomach cancer Maternal Grandmother   . Colon polyps Neg Hx   . Esophageal cancer Neg Hx   . Rectal cancer Neg Hx     Allergies  Allergen Reactions  . Exelon [Rivastigmine Tartrate] Rash  . Lisinopril Diarrhea and Other (See Comments)    REACTION: hallucinations/diarrhea  . Metformin And Related Diarrhea    Current Medications:   Current Outpatient Medications:  .  aspirin 81 MG tablet, Take 81 mg by mouth daily.  , Disp: , Rfl:  .  atorvastatin (LIPITOR) 10 MG tablet, TAKE ONE TABLET BY MOUTH ONCE DAILY, Disp: 90 tablet, Rfl: 3 .  brimonidine (ALPHAGAN) 0.2 % ophthalmic solution, PLACE ONE DROP IN [facility] EYE THREE TIMES DAILY, Disp: , Rfl: 2 .  Calcium Carbonate-Vit D-Min (GNP CALCIUM 1200) 1200-1000 MG-UNIT CHEW, Chew 1 tablet by mouth daily.  , Disp: , Rfl:  .  Carboxymethylcellulose Sodium (LUBRICANT EYE DROPS OP), Place 1 drop into both eyes at bedtime., Disp: , Rfl:  .  escitalopram (LEXAPRO) 5 MG tablet, Take 1 tablet (5 mg total) by mouth at bedtime., Disp: 30 tablet, Rfl: 6 .  glimepiride (AMARYL) 1 MG tablet, TAKE ONE TABLET BY MOUTH ONCE DAILY BEFORE BREAKFAST, Disp: 90 tablet, Rfl: 2 .  glucose blood (ONETOUCH VERIO) test strip, Use to test blood sugars daily. Dx: E11.9, Disp: 100 each, Rfl: 11 .  latanoprost (XALATAN) 0.005 % ophthalmic solution, Place 1 drop into both eyes at bedtime. , Disp: , Rfl:  .  loperamide (IMODIUM) 2 MG capsule, Take 1 capsule (2 mg total) by mouth as needed for diarrhea or loose stools., Disp: 30 capsule, Rfl: 0 .  loratadine (CLARITIN) 10 MG tablet, Take 10 mg by mouth daily., Disp:  , Rfl:  .  losartan-hydrochlorothiazide (HYZAAR) 50-12.5 MG tablet, Take 0.5 tablets by mouth daily., Disp: 45 tablet, Rfl: 3 .  memantine (NAMENDA) 10 MG tablet, Take 1 tablet (10 mg total) by mouth daily., Disp: 30 tablet, Rfl:  11 .  Multiple Vitamins-Iron (MULTIVITAMIN/IRON) TABS, Take 1 tablet by mouth daily.  , Disp: , Rfl:  .  ONETOUCH DELICA LANCETS 30Z MISC, Use to check blood sugars daily. Dx: E11.9, Disp: 100 each, Rfl: 11 .  Probiotic Product (ALIGN) 4 MG CAPS, Take 1 capsule by mouth daily.  , Disp: , Rfl:  .  valsartan-hydrochlorothiazide (DIOVAN-HCT) 80-12.5 MG tablet, TAKE ONE-HALF TABLET BY MOUTH ONCE DAILY, Disp: 45 tablet, Rfl: 5 .  amoxicillin-clavulanate (AUGMENTIN) 875-125 MG tablet, Take 1 tablet by mouth 2 (two) times daily., Disp: 20 tablet, Rfl: 0   Review of Systems:   Review of Systems  Constitutional: Negative for chills.  HENT: Positive for congestion, hoarse voice, sinus pressure, sneezing and sore throat. Negative for ear pain.   Respiratory: Positive for cough. Negative for shortness of breath.   Musculoskeletal: Negative for neck pain.  Neurological: Negative for headaches.    Vitals:   Vitals:   07/13/17 0912  BP: 110/60  Pulse: 74  Temp: 98.3 F (36.8 C)  TempSrc: Oral  SpO2: 97%  Weight: 108 lb (49 kg)  Height: 5\' 1"  (1.549 m)     Body mass index is 20.41 kg/m.  Physical Exam:   Physical Exam  Constitutional: She appears well-developed. She is cooperative.  Non-toxic appearance. She does not have a sickly appearance. She does not appear ill. No distress.  HENT:  Head: Normocephalic and atraumatic.  Right Ear: Tympanic membrane, external ear and ear canal normal. Tympanic membrane is not erythematous, not retracted and not bulging.  Left Ear: Tympanic membrane, external ear and ear canal normal. Tympanic membrane is not erythematous, not retracted and not bulging.  Nose: Mucosal edema and rhinorrhea present. Right sinus exhibits no  maxillary sinus tenderness and no frontal sinus tenderness. Left sinus exhibits no maxillary sinus tenderness and no frontal sinus tenderness.  Mouth/Throat: Uvula is midline and mucous membranes are normal. Posterior oropharyngeal erythema present. No posterior oropharyngeal edema. Tonsils are 0 on the right. Tonsils are 0 on the left.  Eyes: Conjunctivae and lids are normal.  Neck: Trachea normal.  Cardiovascular: Normal rate, regular rhythm, S1 normal, S2 normal and normal heart sounds.  Pulmonary/Chest: Effort normal and breath sounds normal. She has no decreased breath sounds. She has no wheezes. She has no rhonchi. She has no rales.  Lungs clear to auscultation  Lymphadenopathy:    She has no cervical adenopathy.  Neurological: She is alert.  Skin: Skin is warm, dry and intact.  Psychiatric: She has a normal mood and affect. Her speech is normal and behavior is normal.  Nursing note and vitals reviewed.   Assessment and Plan:    Neylan was seen today for sinus problem.  Diagnoses and all orders for this visit:  Upper respiratory tract infection, unspecified type  Other orders -     amoxicillin-clavulanate (AUGMENTIN) 875-125 MG tablet; Take 1 tablet by mouth 2 (two) times daily.   No red flags on exam.  Will initiate Augmentin per orders. Discussed taking medications as prescribed. Reviewed return precautions including worsening fever, SOB, worsening cough or other concerns. Push fluids and rest. I recommend that patient follow-up if symptoms worsen or persist despite treatment x 7-10 days, sooner if needed.   . Reviewed expectations re: course of current medical issues. . Discussed self-management of symptoms. . Outlined signs and symptoms indicating need for more acute intervention. . Patient verbalized understanding and all questions were answered. . See orders for this visit as documented in the  electronic medical record. . Patient received an After-Visit  Summary.  CMA or [name] served as scribe during this visit. History, Physical, and Plan performed by medical provider. Documentation and orders reviewed and attested to.  Inda Coke, PA-C

## 2017-07-19 ENCOUNTER — Encounter: Payer: Self-pay | Admitting: Family Medicine

## 2017-07-21 ENCOUNTER — Other Ambulatory Visit: Payer: Self-pay

## 2017-07-21 MED ORDER — TELMISARTAN-HCTZ 40-12.5 MG PO TABS: 1.0000 | ORAL_TABLET | Freq: Every day | ORAL | 3 refills | Status: DC

## 2017-09-24 ENCOUNTER — Ambulatory Visit (INDEPENDENT_AMBULATORY_CARE_PROVIDER_SITE_OTHER): Payer: Medicare Other | Admitting: Neurology

## 2017-09-24 ENCOUNTER — Encounter: Payer: Self-pay | Admitting: Neurology

## 2017-09-24 ENCOUNTER — Other Ambulatory Visit: Payer: Self-pay

## 2017-09-24 VITALS — BP 122/60 | HR 67 | Wt 113.0 lb

## 2017-09-24 DIAGNOSIS — F320 Major depressive disorder, single episode, mild: Secondary | ICD-10-CM | POA: Diagnosis not present

## 2017-09-24 DIAGNOSIS — F0390 Unspecified dementia without behavioral disturbance: Secondary | ICD-10-CM | POA: Diagnosis not present

## 2017-09-24 DIAGNOSIS — F03A0 Unspecified dementia, mild, without behavioral disturbance, psychotic disturbance, mood disturbance, and anxiety: Secondary | ICD-10-CM

## 2017-09-24 MED ORDER — ESCITALOPRAM OXALATE 5 MG PO TABS: 5.0000 mg | ORAL_TABLET | Freq: Every day | ORAL | 6 refills | Status: DC

## 2017-09-24 MED ORDER — MEMANTINE HCL 10 MG PO TABS: 10.0000 mg | ORAL_TABLET | Freq: Every day | ORAL | 11 refills | Status: DC

## 2017-09-24 NOTE — Patient Instructions (Addendum)
1. Continue Namenda 10mg  daily 2. Follow-up in 6 months, call for any changes  FALL PRECAUTIONS: Be cautious when walking. Scan the area for obstacles that may increase the risk of trips and falls. When getting up in the mornings, sit up at the edge of the bed for a few minutes before getting out of bed. Consider elevating the bed at the head end to avoid drop of blood pressure when getting up. Walk always in a well-lit room (use night lights in the walls). Avoid area rugs or power cords from appliances in the middle of the walkways. Use a walker or a cane if necessary and consider physical therapy for balance exercise. Get your eyesight checked regularly.  FINANCIAL OVERSIGHT: Supervision, especially oversight when making financial decisions or transactions is also recommended.  HOME SAFETY: Consider the safety of the kitchen when operating appliances like stoves, microwave oven, and blender. Consider having supervision and share cooking responsibilities until no longer able to participate in those. Accidents with firearms and other hazards in the house should be identified and addressed as well.  ABILITY TO BE LEFT ALONE: If patient is unable to contact 911 operator, consider using LifeLine, or when the need is there, arrange for someone to stay with patients. Smoking is a fire hazard, consider supervision or cessation. Risk of wandering should be assessed by caregiver and if detected at any point, supervision and safe proof recommendations should be instituted.  MEDICATION SUPERVISION: Inability to self-administer medication needs to be constantly addressed. Implement a mechanism to ensure safe administration of the medications.  RECOMMENDATIONS FOR ALL PATIENTS WITH MEMORY PROBLEMS: 1. Continue to exercise (Recommend 30 minutes of walking everyday, or 3 hours every week) 2. Increase social interactions - continue going to [place] and enjoy social gatherings with friends and family 3. Eat healthy,  avoid fried foods and eat more fruits and vegetables 4. Maintain adequate blood pressure, blood sugar, and blood cholesterol level. Reducing the risk of stroke and cardiovascular disease also helps promoting better memory. 5. Avoid stressful situations. Live a simple life and avoid aggravations. Organize your time and prepare for the next day in anticipation. 6. Sleep well, avoid any interruptions of sleep and avoid any distractions in the bedroom that may interfere with adequate sleep quality 7. Avoid sugar, avoid sweets as there is a strong link between excessive sugar intake, diabetes, and cognitive impairment The Mediterranean diet has been shown to help patients reduce the risk of progressive memory disorders and reduces cardiovascular risk. This includes eating fish, eat fruits and green leafy vegetables, nuts like almonds and hazelnuts, walnuts, and also use olive oil. Avoid fast foods and fried foods as much as possible. Avoid sweets and sugar as sugar use has been linked to worsening of memory function.  There is always a concern of gradual progression of memory problems. If this is the case, then we may need to adjust level of care according to patient needs. Support, both to the patient and caregiver, should then be put into place.

## 2017-09-24 NOTE — Progress Notes (Signed)
NEUROLOGY FOLLOW UP OFFICE NOTE  [name] [id number] January 19, 1946  HISTORY OF PRESENT ILLNESS: I had the pleasure of seeing [name] in follow-up in the neurology clinic on 09/24/2017.  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 MMSE in December 2018 was 19/30. She was reporting more diarrhea and fecal incontinence with Aricept, this was stopped with improvement in GI symptoms. The Exelon patch caused skin reactions. She is now on Namenda 10mg  daily without side effects. She reports her memory is not any better. Family feels she is stable. She was also started on Lexapro on her last visit for mood changes of emotionality and increased frustration. Her daughter reports that this has helped significantly, they saw an immediate improvement within a week of the medication. She denies any side effects. She continues to manage her own medications and bills. She frequently misplaces things. She does not drive. Family has been encouraging her to get out of the house more, but she does not feel like going out because she feels she does not have anything in common with friends any more. She denies any headaches, dizziness, diplopia, focal numbness/tingling/weakness, no falls.   HPI 03/19/2016: This is a pleasant 72 yo RH woman with a history of hypertension, hyperlipidemia, diabetes, fecal incontinence, who presented for evaluation of report of abnormal brain MRI ordered for memory loss. She feels her memory is not as good as it was, she has noticed a gradual decline with memory for names, dates, and conversations. 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 Her motor functions are not where they used to be. She now questions her ability to know how to get places she has been to multiple times in the past. She left groceries in the back of her car. One time she double paid their church in  one month. She denies any missed medications. Her daughter has noticed she would repeat the same conversation. She has more difficulty organizing a meal and does not cook very much anymore. They deny any gait changes, but report she has fallen a couple of times due to missteps. She denies any urinary incontinence. She has a chronic history of fecal incontinence since she delivered her children, and had a [employer] device stimulating her sphincter muscle. This has helped, but she continues to have diarrhea at least once a week. Her husband reports a weight loss of 25 lbs in the past year. Her appetite is not as good as it was. Sleep is good. She denies any headaches, vision changes, dysarthria/dysphagia, neck/back pain, focal numbness/tingling/weakness. No anosmia. She has mild tremors when anxious. They report an incident of loss of consciousness a week after her MRI, she walked into the bathroom then next thing she knew her head was on the cabinet. She was not gone for a long time per family, probably 5 minutes, but she was unable to tell them what had happened on how she hit her forehead. She may have been dizzy, denied any confusion or headache. No other episodes of loss of consciousness in the past. There is no family history of dementia. She denies any alcohol intake, no other head injuries. She was started on Aricept which she is tolerating without side effects.  I personally reviewed MRI brain with and without contrast done 10/1=5/17 which did not show any acute changes. There was mild chronic microvascular disease and mild  volume loss. There was note of enlargement of the lateral and third ventricles which is borderline disproportional to the degree of parenchymal volume loss and increased from 2007. No transependymal flow seen, patent cerebral aqueduct  PAST MEDICAL HISTORY: Past Medical History:  Diagnosis Date  . Allergy   . Arthritis    right arm  . Cataract    bilateral and removal  .  Environmental allergies   . Fecal incontinence    sphincter stim placed 07-2014  . GERD (gastroesophageal reflux disease)   . Glaucoma, both eyes   . History of adenomatous polyp of colon   . History of diverticulitis    duodenal 2012-- resolved  . History of hiatal hernia   . Hyperlipidemia   . Hypertension   . Osteopenia   . Sigmoid diverticulosis   . Status post dilation of esophageal narrowing   . Thyroid disease   . Type 2 diabetes mellitus ([place])   . Wears glasses     MEDICATIONS: Current Outpatient Medications on File Prior to Visit  Medication Sig Dispense Refill  . amoxicillin-clavulanate (AUGMENTIN) 875-125 MG tablet Take 1 tablet by mouth 2 (two) times daily. 20 tablet 0  . aspirin 81 MG tablet Take 81 mg by mouth daily.      Marland Kitchen atorvastatin (LIPITOR) 10 MG tablet TAKE ONE TABLET BY MOUTH ONCE DAILY 90 tablet 3  . brimonidine (ALPHAGAN) 0.2 % ophthalmic solution PLACE ONE DROP IN [facility] EYE THREE TIMES DAILY  2  . Calcium Carbonate-Vit D-Min (GNP CALCIUM 1200) 1200-1000 MG-UNIT CHEW Chew 1 tablet by mouth daily.      . Carboxymethylcellulose Sodium (LUBRICANT EYE DROPS OP) Place 1 drop into both eyes at bedtime.    Marland Kitchen escitalopram (LEXAPRO) 5 MG tablet Take 1 tablet (5 mg total) by mouth at bedtime. 30 tablet 6  . glimepiride (AMARYL) 1 MG tablet TAKE ONE TABLET BY MOUTH ONCE DAILY BEFORE BREAKFAST 90 tablet 2  . glucose blood (ONETOUCH VERIO) test strip Use to test blood sugars daily. Dx: E11.9 100 each 11  . latanoprost (XALATAN) 0.005 % ophthalmic solution Place 1 drop into both eyes at bedtime.     Marland Kitchen loperamide (IMODIUM) 2 MG capsule Take 1 capsule (2 mg total) by mouth as needed for diarrhea or loose stools. 30 capsule 0  . loratadine (CLARITIN) 10 MG tablet Take 10 mg by mouth daily.    . memantine (NAMENDA) 10 MG tablet Take 1 tablet (10 mg total) by mouth daily. 30 tablet 11  . Multiple Vitamins-Iron (MULTIVITAMIN/IRON) TABS Take 1 tablet by mouth daily.      [name] DELICA LANCETS 29U MISC Use to check blood sugars daily. Dx: E11.9 100 each 11  . Probiotic Product (ALIGN) 4 MG CAPS Take 1 capsule by mouth daily.      Marland Kitchen telmisartan-hydrochlorothiazide (MICARDIS HCT) 40-12.5 MG tablet Take 1 tablet by mouth daily. 90 tablet 3   No current facility-administered medications on file prior to visit.     ALLERGIES: Allergies  Allergen Reactions  . Exelon [Rivastigmine Tartrate] Rash  . Lisinopril Diarrhea and Other (See Comments)    REACTION: hallucinations/diarrhea  . Metformin And Related Diarrhea    FAMILY HISTORY: Family History  Problem Relation Age of Onset  . Diabetes Mother   . Heart disease Mother        CABG 55 in 57  . Heart attack Mother   . Stroke Mother   . Cancer Father 66  liver  . Colon cancer Father   . Colon cancer Maternal Grandmother   . Pancreatic cancer Maternal Grandmother   . Stomach cancer Maternal Grandmother   . Colon polyps Neg Hx   . Esophageal cancer Neg Hx   . Rectal cancer Neg Hx     SOCIAL HISTORY: Social History   Socioeconomic History  . Marital status: Married    Spouse name: Not on file  . Number of children: 2  . Years of education: Not on file  . Highest education level: Not on file  Occupational History  . Occupation: retired    Fish farm manager: RETIRED  Social Needs  . Financial resource strain: Not on file  . Food insecurity:    Worry: Not on file    Inability: Not on file  . Transportation needs:    Medical: Not on file    Non-medical: Not on file  Tobacco Use  . Smoking status: Never Smoker  . Smokeless tobacco: Never Used  Substance and Sexual Activity  . Alcohol use: No    Alcohol/week: 0.0 oz  . Drug use: No  . Sexual activity: Not on file  Lifestyle  . Physical activity:    Days per week: Not on file    Minutes per session: Not on file  . Stress: Not on file  Relationships  . Social connections:    Talks on phone: Not on file    Gets together: Not on file     Attends religious service: Not on file    Active member of club or organization: Not on file    Attends meetings of clubs or organizations: Not on file    Relationship status: Not on file  . Intimate partner violence:    Fear of current or ex partner: Not on file    Emotionally abused: Not on file    Physically abused: Not on file    Forced sexual activity: Not on file  Other Topics Concern  . Not on file  Social History Narrative   Married 1967. 2 children (daughters). 7 grandkids.       Retired-[employer] in [place]: reading      Daily caffeine    REVIEW OF SYSTEMS: Constitutional: No fevers, chills, or sweats, no generalized fatigue, change in appetite Eyes: No visual changes, double vision, eye pain Ear, nose and throat: No hearing loss, ear pain, nasal congestion, sore throat Cardiovascular: No chest pain, palpitations Respiratory:  No shortness of breath at rest or with exertion, wheezes GastrointestinaI: No nausea, vomiting, diarrhea, abdominal pain,+ fecal incontinence Genitourinary:  No dysuria, urinary retention or frequency Musculoskeletal:  No neck pain, back pain Integumentary: No rash, pruritus, skin lesions Neurological: as above Psychiatric: No depression, insomnia, anxiety Endocrine: No palpitations, fatigue, diaphoresis, mood swings, change in appetite, change in weight, increased thirst Hematologic/Lymphatic:  No anemia, purpura, petechiae. Allergic/Immunologic: no itchy/runny eyes, nasal congestion, recent allergic reactions, rashes  PHYSICAL EXAM: Vitals:   09/24/17 1013  BP: 122/60  Pulse: 67  SpO2: 99%   General: No acute distress Head:  Normocephalic/atraumatic Neck: supple, no paraspinal tenderness, full range of motion Heart:  Regular rate and rhythm Lungs:  Clear to auscultation bilaterally Back: No paraspinal tenderness Skin/Extremities: No rash, no edema Neurological Exam: alert and oriented to person, place, and  day/month. No aphasia or dysarthria. Fund of knowledge is appropriate.  Recent and remote memory are impaired  Attention and concentration are normal.    Able to name  objects and repeat phrases. CDT 4/5 MMSE - Mini Mental State Exam 09/24/2017 03/24/2017  Orientation to time 2 4  Orientation to Place 4 4  Registration 3 3  Attention/ Calculation 0 0  Recall 0 0  Language- name 2 objects 2 2  Language- repeat 1 1  Language- follow 3 step command 3 3  Language- read & follow direction 1 1  Write a sentence 1 1  Copy design 0 0  Total score 17 19    Montreal Cognitive Assessment  09/17/2016 03/19/2016  Visuospatial/ Executive (0/5) 4 3  Naming (0/3) 3 3  Attention: Read list of digits (0/2) 1 1  Attention: Read list of letters (0/1) 0 1  Attention: Serial 7 subtraction starting at 100 (0/3) 2 3  Language: Repeat phrase (0/2) 2 2  Language : Fluency (0/1) 1 0  Abstraction (0/2) 2 2  Delayed Recall (0/5) 0 0  Orientation (0/6) 4 5  Total 19 20   Cranial nerves: Pupils equal, round, reactive to light.  Extraocular movements intact with no nystagmus. Visual fields full. Facial sensation intact. No facial asymmetry. Tongue, uvula, palate midline.  Motor: Bulk and tone normal, no cogwheeling, muscle strength 5/5 throughout with no pronator drift.  Sensation to light touch intact.  No extinction to double simultaneous stimulation.  Deep tendon reflexes +1 throughout, toes downgoing.  Finger to nose testing intact.  Gait narrow-based and steady, able to tandem walk adequately. Fair arm swing, no magnetic gait noted. Romberg negative.   IMPRESSION: This is a pleasant 72 yo RH woman with a history of hypertension, hyperlipidemia, diabetes, fecal incontinence, with mild dementia. MRI brain showed diffuse atrophy and borderline disproportional enlargement of the ventricles to the degree of parenchymal volume loss. There was question about normal pressure hydrocephalus, however she does not have any  other features of NPH except for the memory changes. No magnetic gait noted, no urinary incontinence. MMSE today 17/30 (19/30 in December 2018). She had side effects on cholinesterase inhibitors and is tolerating Namenda 10mg  daily without side effects. Mood has significantly improved with low dose Lexapro 5mg  daily. We discussed increasing family supervision to check behind her to ensure medications are taken as instructed. She does not drive. We again discussed the importance of control of vascular risk factors, physical exercise, and brain stimulation exercises for brain health. She will follow-up in 6 months and knows to call for any changes.   Thank you for allowing me to participate in her care.  Please do not hesitate to call for any questions or concerns.  The duration of this appointment visit was 30 minutes of face-to-face time with the patient.  Greater than 50% of this time was spent in counseling, explanation of diagnosis, planning of further management, and coordination of care.   Ellouise Newer, M.D.   CC: Dr. Yong Channel

## 2017-09-27 ENCOUNTER — Encounter: Payer: Self-pay | Admitting: Family Medicine

## 2017-09-27 ENCOUNTER — Ambulatory Visit (INDEPENDENT_AMBULATORY_CARE_PROVIDER_SITE_OTHER): Payer: Medicare Other | Admitting: Family Medicine

## 2017-09-27 VITALS — BP 122/68 | HR 71 | Temp 98.8°F | Ht 61.0 in | Wt 112.0 lb

## 2017-09-27 DIAGNOSIS — I10 Essential (primary) hypertension: Secondary | ICD-10-CM | POA: Diagnosis not present

## 2017-09-27 DIAGNOSIS — Z9889 Other specified postprocedural states: Secondary | ICD-10-CM

## 2017-09-27 DIAGNOSIS — E119 Type 2 diabetes mellitus without complications: Secondary | ICD-10-CM

## 2017-09-27 DIAGNOSIS — Z9009 Acquired absence of other part of head and neck: Secondary | ICD-10-CM

## 2017-09-27 DIAGNOSIS — E785 Hyperlipidemia, unspecified: Secondary | ICD-10-CM

## 2017-09-27 DIAGNOSIS — E890 Postprocedural hypothyroidism: Secondary | ICD-10-CM

## 2017-09-27 LAB — POCT GLYCOSYLATED HEMOGLOBIN (HGB A1C): HEMOGLOBIN A1C: 5.8 % — AB (ref 4.0–5.6)

## 2017-09-27 NOTE — Progress Notes (Signed)
Subjective:  [name] is a 72 y.o. year old very pleasant female patient who presents for/with See problem oriented charting ROS- cough, sinus pressure. No hypoglycemia. No chest pain or shortness of breath or fever.    Past Medical History-  Patient Active Problem List   Diagnosis Date Noted  . Mild dementia 08/09/2015    Priority: High  . Fecal incontinence 03/23/2014    Priority: High  . Diabetes mellitus type II, controlled ([place]) 10/19/2006    Priority: High  . Mobitz type II atrioventricular block 04/21/2016    Priority: Medium  . History of adenomatous polyp of colon 08/09/2015    Priority: Medium  . H/O partial thyroidectomy 03/23/2014    Priority: Medium  . Hyperlipidemia 10/19/2006    Priority: Medium  . Essential hypertension 10/19/2006    Priority: Medium  . Glaucoma 03/23/2014    Priority: Low  . Family history of malignant neoplasm of gastrointestinal tract 08/26/2010    Priority: Low  . Anemia 08/26/2010    Priority: Low  . Esophageal stricture 07/16/2010    Priority: Low  . Osteopenia 04/22/2007    Priority: Low  . Current mild episode of major depressive disorder without prior episode ([place]) 03/24/2017  . Syncope and collapse 04/09/2016    Medications- reviewed and updated Current Outpatient Medications  Medication Sig Dispense Refill  . atorvastatin (LIPITOR) 10 MG tablet TAKE ONE TABLET BY MOUTH ONCE DAILY 90 tablet 3  . brimonidine (ALPHAGAN) 0.2 % ophthalmic solution PLACE ONE DROP IN [facility] EYE THREE TIMES DAILY  2  . Calcium Carbonate-Vit D-Min (GNP CALCIUM 1200) 1200-1000 MG-UNIT CHEW Chew 1 tablet by mouth daily.      . Carboxymethylcellulose Sodium (LUBRICANT EYE DROPS OP) Place 1 drop into both eyes at bedtime.    Marland Kitchen escitalopram (LEXAPRO) 5 MG tablet Take 1 tablet (5 mg total) by mouth at bedtime. 30 tablet 6  . latanoprost (XALATAN) 0.005 % ophthalmic solution Place 1 drop into both eyes at bedtime.     Marland Kitchen loperamide (IMODIUM) 2 MG  capsule Take 1 capsule (2 mg total) by mouth as needed for diarrhea or loose stools. 30 capsule 0  . loratadine (CLARITIN) 10 MG tablet Take 10 mg by mouth daily.    . memantine (NAMENDA) 10 MG tablet Take 1 tablet (10 mg total) by mouth daily. 30 tablet 11  . Multiple Vitamins-Iron (MULTIVITAMIN/IRON) TABS Take 1 tablet by mouth daily.      . Probiotic Product (ALIGN) 4 MG CAPS Take 1 capsule by mouth daily.      Marland Kitchen telmisartan-hydrochlorothiazide (MICARDIS HCT) 40-12.5 MG tablet Take 1 tablet by mouth daily. 90 tablet 3   No current facility-administered medications for this visit.     Objective: BP 122/68 (BP Location: Left Arm, Patient Position: Sitting, Cuff Size: Normal)   Pulse 71   Temp 98.8 F (37.1 C) (Oral)   Ht 5\' 1"  (1.549 m)   Wt 112 lb (50.8 kg)   SpO2 98%   BMI 21.16 kg/m  Gen: NAD, resting comfortably TM normal, nasal turbinates with edema and erythema as well as some yellow discharge.  No significant sinus pressure.  Pharynx with some signs of drainage. CV: RRR no murmurs rubs or gallops Lungs: CTAB no crackles, wheeze, rhonchi Abdomen: soft/nontender/nondistended/normal bowel sounds. Ext: no edema Skin: warm, dry Neuro: Looks to her husband for answers at times  Assessment/Plan:  Viral sinusitis  s: Cough 4 days along with Sinus pressure. PND. Little discharge from nose-  yellow when does. No sob. No fever. Has done 3 days of claritin- minimal help  Husband has been sick for 3 weeks.  A/P: Viral sinusitis-discussed conservative care including use of Mucinex or Mucinex DM.  She can continue prn claritin when having issues. Opted against prednisone with glaucoma history. Discussed if not improved within a week I would call in augmentin  Diabetes mellitus type II, controlled ([place]) S: well controlled on amaryl 1mg  Lab Results  Component Value Date   HGBA1C 5.8 (A) 09/27/2017   HGBA1C 6.0 12/24/2016   HGBA1C 6.2 06/23/2016   A/P: a1c trending down and I worry  about potential hypoglycemia- will go ahead and stop amaryl and ask for 4 month follow up  Essential hypertension S: controlled on telmisartan hctz 40-25mg  BP Readings from Last 3 Encounters:  09/27/17 122/68  09/24/17 122/60  07/13/17 110/60  A/P: We discussed blood pressure goal of <140/90. Continue current meds   Future Appointments  Date Time Provider [place]  09/30/2017  1:00 PM Williemae Area, RN [facility]  05/09/2018  3:00 PM Cameron Sprang, MD [facility] None  Needs updated foot exam at wellness visit  I encouraged her to schedule fasting labs-she can do that on day of wellness visit if she prefers  Lab/Order associations: Controlled type 2 diabetes mellitus without complication, without long-term current use of insulin ([place]) - Plan: POCT glycosylated hemoglobin (Hb A1C)  Hyperlipidemia, unspecified hyperlipidemia type - Plan: CBC, Comprehensive metabolic panel, Lipid panel, TSH  Essential hypertension - Plan: CBC, Comprehensive metabolic panel, Lipid panel  H/O partial thyroidectomy - Plan: TSH  Return precautions advised.  [name], MD

## 2017-09-27 NOTE — Assessment & Plan Note (Signed)
S: well controlled on amaryl 1mg  Lab Results  Component Value Date   HGBA1C 5.8 (A) 09/27/2017   HGBA1C 6.0 12/24/2016   HGBA1C 6.2 06/23/2016   A/P: a1c trending down and I worry about potential hypoglycemia- will go ahead and stop amaryl and ask for 4 month follow up

## 2017-09-27 NOTE — Assessment & Plan Note (Signed)
S: controlled on telmisartan hctz 40-25mg  BP Readings from Last 3 Encounters:  09/27/17 122/68  09/24/17 122/60  07/13/17 110/60  A/P: We discussed blood pressure goal of <140/90. Continue current meds

## 2017-09-27 NOTE — Patient Instructions (Addendum)
Med changes 1. Stop glimepiride 2. Stop aspirin 3. Can try mucinex or extra strength mucinex- drink a full bottle of water with this. Do NOT use mucinex - D. You can use mucinex- DM which also has a slight cough suppressant - if you are still having sinus pressure and discharge in a week I will call in antibiotic augmentin for you   Health maintainence 1. I would also like for you to sign up for an annual wellness visit with one of our nurses, Cassie or Manuela Schwartz, who both specialize in the annual wellness visit. This is a free benefit under medicare that may help [place] find additional ways to help you. Some highlights are reviewing medications, lifestyle, and doing a dementia screen. 2. On the same day as your wellness visit- come fasting and we can update your labs. Go ahead and schedule a lab visit that day for the labs I ordered today 3. Please check with your pharmacy to see if they have the shingrix vaccine. If they do- please get this immunization and update [place] by phone call or mychart with dates you receive the vaccine   Health Maintenance Due  Topic Date Due  . FOOT EXAM - planned at wellness visit 06/23/2017  . HEMOGLOBIN A1C - Today at office visit 06/23/2017  . MAMMOGRAM - our team will give you a handout for this- need to do these every other year through age 34 07/09/2017

## 2017-09-30 ENCOUNTER — Encounter: Payer: Self-pay | Admitting: *Deleted

## 2017-09-30 ENCOUNTER — Other Ambulatory Visit (INDEPENDENT_AMBULATORY_CARE_PROVIDER_SITE_OTHER): Payer: Medicare Other

## 2017-09-30 ENCOUNTER — Telehealth: Payer: Self-pay

## 2017-09-30 ENCOUNTER — Ambulatory Visit (INDEPENDENT_AMBULATORY_CARE_PROVIDER_SITE_OTHER): Payer: Medicare Other | Admitting: *Deleted

## 2017-09-30 VITALS — BP 122/60 | HR 82 | Ht 62.0 in | Wt 112.0 lb

## 2017-09-30 DIAGNOSIS — Z0000 Encounter for general adult medical examination without abnormal findings: Secondary | ICD-10-CM | POA: Diagnosis not present

## 2017-09-30 DIAGNOSIS — Z9889 Other specified postprocedural states: Secondary | ICD-10-CM

## 2017-09-30 DIAGNOSIS — Z9009 Acquired absence of other part of head and neck: Secondary | ICD-10-CM

## 2017-09-30 DIAGNOSIS — E785 Hyperlipidemia, unspecified: Secondary | ICD-10-CM

## 2017-09-30 DIAGNOSIS — I10 Essential (primary) hypertension: Secondary | ICD-10-CM | POA: Diagnosis not present

## 2017-09-30 DIAGNOSIS — E890 Postprocedural hypothyroidism: Secondary | ICD-10-CM

## 2017-09-30 LAB — CBC
HCT: 33.5 % — ABNORMAL LOW (ref 36.0–46.0)
HEMOGLOBIN: 11.3 g/dL — AB (ref 12.0–15.0)
MCHC: 33.7 g/dL (ref 30.0–36.0)
MCV: 88.8 fl (ref 78.0–100.0)
Platelets: 286 10*3/uL (ref 150.0–400.0)
RBC: 3.77 Mil/uL — AB (ref 3.87–5.11)
RDW: 13 % (ref 11.5–15.5)
WBC: 11.2 10*3/uL — ABNORMAL HIGH (ref 4.0–10.5)

## 2017-09-30 LAB — LIPID PANEL
CHOL/HDL RATIO: 2
Cholesterol: 136 mg/dL (ref 0–200)
HDL: 65.7 mg/dL (ref >39.00)
LDL Cholesterol: 55 mg/dL (ref 0–99)
NONHDL: 70.77
Triglycerides: 80 mg/dL (ref 0.0–149.0)
VLDL: 16 mg/dL (ref 0.0–40.0)

## 2017-09-30 LAB — COMPREHENSIVE METABOLIC PANEL
ALK PHOS: 66 U/L (ref 39–117)
ALT: 10 U/L (ref 0–35)
AST: 11 U/L (ref 0–37)
Albumin: 4.2 g/dL (ref 3.5–5.2)
BILIRUBIN TOTAL: 0.7 mg/dL (ref 0.2–1.2)
BUN: 15 mg/dL (ref 6–23)
CO2: 34 meq/L — AB (ref 19–32)
Calcium: 9.6 mg/dL (ref 8.4–10.5)
Chloride: 102 mEq/L (ref 96–112)
Creatinine, Ser: 0.72 mg/dL (ref 0.40–1.20)
GFR: 84.43 mL/min (ref >60.00)
GLUCOSE: 81 mg/dL (ref 70–99)
Potassium: 3.6 mEq/L (ref 3.5–5.1)
SODIUM: 142 meq/L (ref 135–145)
TOTAL PROTEIN: 6.7 g/dL (ref 6.0–8.3)

## 2017-09-30 LAB — TSH: TSH: 1.25 u[IU]/mL (ref 0.35–4.50)

## 2017-09-30 MED ORDER — AMOXICILLIN-POT CLAVULANATE 875-125 MG PO TABS
1.0000 | ORAL_TABLET | Freq: Two times a day (BID) | ORAL | 0 refills | Status: AC
Start: 2017-09-30 — End: 2017-10-07

## 2017-09-30 NOTE — Progress Notes (Signed)
Your CBC was normal (blood counts, infection fighting cells, platelets) other than mild anemia and slightly high infection fighting cells- team please repeat CBC with differential within 2 weeks under leukocytosis (slightly high infection fighting cells) Your CMET was normal (kidney, liver, and electrolytes, blood sugar).  Your cholesterol looks great Your thyroid was normal.

## 2017-09-30 NOTE — Telephone Encounter (Signed)
Called patient and let her know medication had been sent in. She verbalized understanding

## 2017-09-30 NOTE — Telephone Encounter (Signed)
Patient in for AWV  Still c/o of viral sinusitis-and cough  Failure of conservative care including use of Mucinex or Mucinex DM q 12 h. Per your note 06/24:  "She can continue prn claritin when having issues. Opted against prednisone with glaucoma history. Discussed if not improved within a week I would call in augmentin"   They are requesting augmentin. They will be leaving town on Tuesday   Please advise

## 2017-09-30 NOTE — Patient Instructions (Addendum)
[name] , Thank you for taking time to come for your Medicare Wellness Visit. I appreciate your ongoing commitment to your health goals. Please review the following plan we discussed and let me know if I can assist you in the future.   Will schedule your mammogram this year.   A Tetanus is recommended every 10 years. Medicare covers a tetanus if you have a cut or wound; otherwise, there may be a charge. If you had not had a tetanus with pertusses, known as the Tdap, you can take this anytime.   Shingrix is a vaccine for the prevention of Shingles in Adults 50 and older.  If you are on Medicare, the shingrix is covered under your Part D plan, so you will take both of the vaccines in the series at your pharmacy. Please check with your benefits regarding applicable copays or out of pocket expenses.  The Shingrix is given in 2 vaccines approx 8 weeks apart. You must receive the 2nd dose prior to 6 months from receipt of the first. Please have the pharmacist print out you Immunization  dates for our office records    These are the goals we discussed: Goals    . Patient Stated     Patient wants to maintain your health        This is a list of the screening recommended for you and due dates:  Health Maintenance  Topic Date Due  . Complete foot exam   06/23/2017  . Mammogram  07/09/2017  . Tetanus Vaccine  11/03/2017  . Flu Shot  11/04/2017  . Hemoglobin A1C  03/29/2018  . Eye exam for diabetics  04/19/2018  . Colon Cancer Screening  05/02/2020  . DEXA scan (bone density measurement)  Completed  .  Hepatitis C: One time screening is recommended by Center for Disease Control  (CDC) for  adults born from [birth year] through 1966.   Completed  . Pneumonia vaccines  Completed    Health Maintenance for Postmenopausal Women Menopause is a normal process in which your reproductive ability comes to an end. This process happens gradually over a span of months to years, usually between the ages  of 77 and 73. Menopause is complete when you have missed 12 consecutive menstrual periods. It is important to talk with your health care provider about some of the most common conditions that affect postmenopausal women, such as heart disease, cancer, and bone loss (osteoporosis). Adopting a healthy lifestyle and getting preventive care can help to promote your health and wellness. Those actions can also lower your chances of developing some of these common conditions. What should I know about menopause? During menopause, you may experience a number of symptoms, such as:  Moderate-to-severe hot flashes.  Night sweats.  Decrease in sex drive.  Mood swings.  Headaches.  Tiredness.  Irritability.  Memory problems.  Insomnia.  Choosing to treat or not to treat menopausal changes is an individual decision that you make with your health care provider. What should I know about hormone replacement therapy and supplements? Hormone therapy products are effective for treating symptoms that are associated with menopause, such as hot flashes and night sweats. Hormone replacement carries certain risks, especially as you become older. If you are thinking about using estrogen or estrogen with progestin treatments, discuss the benefits and risks with your health care provider. What should I know about heart disease and stroke? Heart disease, heart attack, and stroke become more likely as you age. This may be  due, in part, to the hormonal changes that your body experiences during menopause. These can affect how your body processes dietary fats, triglycerides, and cholesterol. Heart attack and stroke are both medical emergencies. There are many things that you can do to help prevent heart disease and stroke:  Have your blood pressure checked at least every 1-2 years. High blood pressure causes heart disease and increases the risk of stroke.  If you are 74-31 years old, ask your health care provider if  you should take aspirin to prevent a heart attack or a stroke.  Do not use any tobacco products, including cigarettes, chewing tobacco, or electronic cigarettes. If you need help quitting, ask your health care provider.  It is important to eat a healthy diet and maintain a healthy weight. ? Be sure to include plenty of vegetables, fruits, low-fat dairy products, and lean protein. ? Avoid eating foods that are high in solid fats, added sugars, or salt (sodium).  Get regular exercise. This is one of the most important things that you can do for your health. ? Try to exercise for at least 150 minutes each week. The type of exercise that you do should increase your heart rate and make you sweat. This is known as moderate-intensity exercise. ? Try to do strengthening exercises at least twice each week. Do these in addition to the moderate-intensity exercise.  Know your numbers.Ask your health care provider to check your cholesterol and your blood glucose. Continue to have your blood tested as directed by your health care provider.  What should I know about cancer screening? There are several types of cancer. Take the following steps to reduce your risk and to catch any cancer development as early as possible. Breast Cancer  Practice breast self-awareness. ? This means understanding how your breasts normally appear and feel. ? It also means doing regular breast self-exams. Let your health care provider know about any changes, no matter how small.  If you are 41 or older, have a clinician do a breast exam (clinical breast exam or CBE) every year. Depending on your age, family history, and medical history, it may be recommended that you also have a yearly breast X-ray (mammogram).  If you have a family history of breast cancer, talk with your health care provider about genetic screening.  If you are at high risk for breast cancer, talk with your health care provider about having an MRI and a  mammogram every year.  Breast cancer (BRCA) gene test is recommended for women who have family members with BRCA-related cancers. Results of the assessment will determine the need for genetic counseling and BRCA1 and for BRCA2 testing. BRCA-related cancers include these types: ? Breast. This occurs in males or females. ? Ovarian. ? Tubal. This may also be called fallopian tube cancer. ? Cancer of the abdominal or pelvic lining (peritoneal cancer). ? Prostate. ? Pancreatic.  Cervical, Uterine, and Ovarian Cancer Your health care provider may recommend that you be screened regularly for cancer of the pelvic organs. These include your ovaries, uterus, and vagina. This screening involves a pelvic exam, which includes checking for microscopic changes to the surface of your cervix (Pap test).  For women ages 21-65, health care providers may recommend a pelvic exam and a Pap test every three years. For women ages 34-65, they may recommend the Pap test and pelvic exam, combined with testing for human papilloma virus (HPV), every five years. Some types of HPV increase your risk of cervical cancer.  Testing for HPV may also be done on women of any age who have unclear Pap test results.  Other health care providers may not recommend any screening for nonpregnant women who are considered low risk for pelvic cancer and have no symptoms. Ask your health care provider if a screening pelvic exam is right for you.  If you have had past treatment for cervical cancer or a condition that could lead to cancer, you need Pap tests and screening for cancer for at least 20 years after your treatment. If Pap tests have been discontinued for you, your risk factors (such as having a new sexual partner) need to be reassessed to determine if you should start having screenings again. Some women have medical problems that increase the chance of getting cervical cancer. In these cases, your health care provider may recommend that  you have screening and Pap tests more often.  If you have a family history of uterine cancer or ovarian cancer, talk with your health care provider about genetic screening.  If you have vaginal bleeding after reaching menopause, tell your health care provider.  There are currently no reliable tests available to screen for ovarian cancer.  Lung Cancer Lung cancer screening is recommended for adults 62-52 years old who are at high risk for lung cancer because of a history of smoking. A yearly low-dose CT scan of the lungs is recommended if you:  Currently smoke.  Have a history of at least 30 pack-years of smoking and you currently smoke or have quit within the past 15 years. A pack-year is smoking an average of one pack of cigarettes per day for one year.  Yearly screening should:  Continue until it has been 15 years since you quit.  Stop if you develop a health problem that would prevent you from having lung cancer treatment.  Colorectal Cancer  This type of cancer can be detected and can often be prevented.  Routine colorectal cancer screening usually begins at age 80 and continues through age 30.  If you have risk factors for colon cancer, your health care provider may recommend that you be screened at an earlier age.  If you have a family history of colorectal cancer, talk with your health care provider about genetic screening.  Your health care provider may also recommend using home test kits to check for hidden blood in your stool.  A small camera at the end of a tube can be used to examine your colon directly (sigmoidoscopy or colonoscopy). This is done to check for the earliest forms of colorectal cancer.  Direct examination of the colon should be repeated every 5-10 years until age 47. However, if early forms of precancerous polyps or small growths are found or if you have a family history or genetic risk for colorectal cancer, you may need to be screened more  often.  Skin Cancer  Check your skin from head to toe regularly.  Monitor any moles. Be sure to tell your health care provider: ? About any new moles or changes in moles, especially if there is a change in a mole's shape or color. ? If you have a mole that is larger than the size of a pencil eraser.  If any of your family members has a history of skin cancer, especially at a young age, talk with your health care provider about genetic screening.  Always use sunscreen. Apply sunscreen liberally and repeatedly throughout the day.  Whenever you are outside, protect yourself by wearing  long sleeves, pants, a wide-brimmed hat, and sunglasses.  What should I know about osteoporosis? Osteoporosis is a condition in which bone destruction happens more quickly than new bone creation. After menopause, you may be at an increased risk for osteoporosis. To help prevent osteoporosis or the bone fractures that can happen because of osteoporosis, the following is recommended:  If you are 6-69 years old, get at least 1,000 mg of calcium and at least 600 mg of vitamin D per day.  If you are older than age 62 but younger than age 11, get at least 1,200 mg of calcium and at least 600 mg of vitamin D per day.  If you are older than age 75, get at least 1,200 mg of calcium and at least 800 mg of vitamin D per day.  Smoking and excessive alcohol intake increase the risk of osteoporosis. Eat foods that are rich in calcium and vitamin D, and do weight-bearing exercises several times each week as directed by your health care provider. What should I know about how menopause affects my mental health? Depression may occur at any age, but it is more common as you become older. Common symptoms of depression include:  Low or sad mood.  Changes in sleep patterns.  Changes in appetite or eating patterns.  Feeling an overall lack of motivation or enjoyment of activities that you previously enjoyed.  Frequent  crying spells.  Talk with your health care provider if you think that you are experiencing depression. What should I know about immunizations? It is important that you get and maintain your immunizations. These include:  Tetanus, diphtheria, and pertussis (Tdap) booster vaccine.  Influenza every year before the flu season begins.  Pneumonia vaccine.  Shingles vaccine.  Your health care provider may also recommend other immunizations. This information is not intended to replace advice given to you by your health care provider. Make sure you discuss any questions you have with your health care provider. Document Released: 05/15/2005 Document Revised: 10/11/2015 Document Reviewed: 12/25/2014 Elsevier Interactive Patient Education  2018 [place] in the Home Falls can cause injuries. They can happen to people of all ages. There are many things you can do to make your home safe and to help prevent falls. What can I do on the outside of my home?  Regularly fix the edges of walkways and driveways and fix any cracks.  Remove anything that might make you trip as you walk through a door, such as a raised step or threshold.  Trim any bushes or trees on the path to your home.  Use bright outdoor lighting.  Clear any walking paths of anything that might make someone trip, such as rocks or tools.  Regularly check to see if handrails are loose or broken. Make sure that both sides of any steps have handrails.  Any raised decks and porches should have guardrails on the edges.  Have any leaves, snow, or ice cleared regularly.  Use sand or salt on walking paths during winter.  Clean up any spills in your garage right away. This includes oil or grease spills. What can I do in the bathroom?  Use night lights.  Install grab bars by the toilet and in the tub and shower. Do not use towel bars as grab bars.  Use non-skid mats or decals in the tub or shower.  If you need  to sit down in the shower, use a plastic, non-slip stool.  Keep the floor dry. Clean  up any water that spills on the floor as soon as it happens.  Remove soap buildup in the tub or shower regularly.  Attach bath mats securely with double-sided non-slip rug tape.  Do not have throw rugs and other things on the floor that can make you trip. What can I do in the bedroom?  Use night lights.  Make sure that you have a light by your bed that is easy to reach.  Do not use any sheets or blankets that are too big for your bed. They should not hang down onto the floor.  Have a firm chair that has side arms. You can use this for support while you get dressed.  Do not have throw rugs and other things on the floor that can make you trip. What can I do in the kitchen?  Clean up any spills right away.  Avoid walking on wet floors.  Keep items that you use a lot in easy-to-reach places.  If you need to reach something above you, use a strong step stool that has a grab bar.  Keep electrical cords out of the way.  Do not use floor polish or wax that makes floors slippery. If you must use wax, use non-skid floor wax.  Do not have throw rugs and other things on the floor that can make you trip. What can I do with my stairs?  Do not leave any items on the stairs.  Make sure that there are handrails on both sides of the stairs and use them. Fix handrails that are broken or loose. Make sure that handrails are as long as the stairways.  Check any carpeting to make sure that it is firmly attached to the stairs. Fix any carpet that is loose or worn.  Avoid having throw rugs at the top or bottom of the stairs. If you do have throw rugs, attach them to the floor with carpet tape.  Make sure that you have a light switch at the top of the stairs and the bottom of the stairs. If you do not have them, ask someone to add them for you. What else can I do to help prevent falls?  Wear shoes that: ? Do  not have high heels. ? Have rubber bottoms. ? Are comfortable and fit you well. ? Are closed at the toe. Do not wear sandals.  If you use a stepladder: ? Make sure that it is fully opened. Do not climb a closed stepladder. ? Make sure that both sides of the stepladder are locked into place. ? Ask someone to hold it for you, if possible.  Clearly mark and make sure that you can see: ? Any grab bars or handrails. ? First and last steps. ? Where the edge of each step is.  Use tools that help you move around (mobility aids) if they are needed. These include: ? Canes. ? Walkers. ? Scooters. ? Crutches.  Turn on the lights when you go into a dark area. Replace any light bulbs as soon as they burn out.  Set up your furniture so you have a clear path. Avoid moving your furniture around.  If any of your floors are uneven, fix them.  If there are any pets around you, be aware of where they are.  Review your medicines with your doctor. Some medicines can make you feel dizzy. This can increase your chance of falling. Ask your doctor what other things that you can do to help prevent falls. This  information is not intended to replace advice given to you by your health care provider. Make sure you discuss any questions you have with your health care provider. Document Released: 01/17/2009 Document Revised: 08/29/2015 Document Reviewed: 04/27/2014 Elsevier Interactive Patient Education  [employer].

## 2017-09-30 NOTE — Telephone Encounter (Signed)
I sent in augmentin- please inform patient.   [name]

## 2017-09-30 NOTE — Progress Notes (Signed)
Subjective:   [name] is a 72 y.o. female who presents for Medicare Annual (Subsequent) preventive examination.  Reports health as good Dr. Yong Channel seen 09/27/2017 Dr. 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 States she does not sit around   Diabetic eye exam 04/2017  Treating glaucoma  Health Maintenance Due  Topic Date Due  . FOOT EXAM  06/23/2017  . MAMMOGRAM  07/09/2017   Colonoscopy 04/2015 - repeat in 10 years  Mammogram due since 2017 -   needs a tetanus with pertussis 11/04/2017   Educated regarding the shingrix vaccines     Objective:     Vitals: BP 122/60   Pulse 82   Ht 5\' 2"  (1.575 m)   Wt 112 lb (50.8 kg)   SpO2 96%   BMI 20.49 kg/m   Body mass index is 20.49 kg/m.  Advanced Directives 04/09/2016 05/03/2015 03/26/2015 08/03/2014 03/20/2013  Does Patient Have a Medical Advance Directive? Yes Yes Yes Yes Patient has advance directive, copy not in chart  Type of Advance Directive Living will - [place];Living will Healthcare Power of [place]  Does patient want to make changes to medical advance directive? No - Patient declined - - No - Patient declined -  Copy of [place] in Chart? - No - copy requested - No - copy requested -    Tobacco Social History   Tobacco Use  Smoking Status Never Smoker  Smokeless Tobacco Never Used     Counseling given: Yes   Clinical Intake:    Past Medical History:  Diagnosis Date  . Allergy   . Arthritis    right arm  . Cataract    bilateral and removal  . Environmental allergies   . Fecal incontinence    sphincter stim placed  07-2014  . GERD (gastroesophageal reflux disease)   . Glaucoma, both eyes   . History of adenomatous polyp of colon   . History of diverticulitis    duodenal 2012-- resolved  . History of hiatal hernia   . Hyperlipidemia   . Hypertension   . Osteopenia   . Sigmoid diverticulosis   . Status post dilation of esophageal narrowing   . Thyroid disease   . Type 2 diabetes mellitus ([place])   . Wears glasses    Past Surgical History:  Procedure Laterality Date  . ANAL RECTAL MANOMETRY N/A 03/20/2013  . BUNIONECTOMY Bilateral 1985  . CARDIOVASCULAR STRESS TEST  02-13-2005   normal perfusion study/  no ischemia/  normal LV function and wall motion , ef 86%  . CARPAL TUNNEL RELEASE Left 08-05-2006  . CATARACT EXTRACTION    . COLONOSCOPY    . COLONOSCOPY W/ POLYPECTOMY  last one 02-13-2010  . ESOPHAGOGASTRODUODENOSCOPY (EGD) WITH ESOPHAGEAL DILATION  last one 09-11-2010  . KNEE ARTHROSCOPY Right 2013  . LAPAROSCOPIC NISSEN FUNDOPLICATION  3536  . peripheral neuro stimulator  07-2014   sphincter stimulator  . POLYPECTOMY    . RECTAL ULTRASOUND N/A 03/20/2013   nerve damage  . RETINAL DETACHMENT SURGERY  2008  . THYROIDECTOMY  1979  partial  . TONSILLECTOMY AND ADENOIDECTOMY  as child  . TRIGGER FINGER RELEASE  1999  . TUBAL LIGATION  1976  . VAGINAL HYSTERECTOMY  1983   Family History  Problem Relation Age of Onset  . Diabetes Mother   . Heart disease Mother        CABG 25 in 79  . Heart attack Mother   . Stroke Mother   . Cancer Father 69       liver  . Colon cancer Father   . Colon cancer Maternal Grandmother   . Pancreatic cancer Maternal Grandmother   . Stomach cancer Maternal Grandmother   . Colon polyps Neg Hx   . Esophageal cancer Neg Hx   . Rectal cancer Neg Hx    Social History   Socioeconomic History  . Marital status: Married    Spouse name: Not on file  . Number of children: 2  . Years of education: Not on file  . Highest education level: Not on file    Occupational History  . Occupation: retired    Fish farm manager: RETIRED  Social Needs  . Financial resource strain: Not on file  . Food insecurity:    Worry: Not on file    Inability: Not on file  . Transportation needs:    Medical: Not on file    Non-medical: Not on file  Tobacco Use  . Smoking status: Never Smoker  . Smokeless tobacco: Never Used  Substance and Sexual Activity  . Alcohol use: No    Alcohol/week: 0.0 oz  . Drug use: No  . Sexual activity: Not on file  Lifestyle  . Physical activity:    Days per week: Not on file    Minutes per session: Not on file  . Stress: Not on file  Relationships  . Social connections:    Talks on phone: Not on file    Gets together: Not on file    Attends religious service: Not on file    Active member of club or organization: Not on file    Attends meetings of clubs or organizations: Not on file    Relationship status: Not on file  Other Topics Concern  . Not on file  Social History Narrative   Married 1967. 2 children (daughters). 7 grandkids.       Retired-[employer] in [place]: reading      Daily caffeine    Outpatient Encounter Medications as of 09/30/2017  Medication Sig  . atorvastatin (LIPITOR) 10 MG tablet TAKE ONE TABLET BY MOUTH ONCE DAILY  . brimonidine (ALPHAGAN) 0.2 % ophthalmic solution PLACE ONE DROP IN [facility] EYE THREE TIMES DAILY  . Calcium Carbonate-Vit D-Min (GNP CALCIUM 1200) 1200-1000 MG-UNIT CHEW Chew 1 tablet by mouth daily.    . Carboxymethylcellulose Sodium (LUBRICANT EYE DROPS OP) Place 1 drop into both eyes at bedtime.  Marland Kitchen escitalopram (LEXAPRO) 5 MG tablet Take 1 tablet (5 mg total) by mouth at bedtime.  Marland Kitchen guaiFENesin (MUCINEX) 600 MG 12 hr tablet Take by mouth 2 (two) times daily.  Marland Kitchen latanoprost (XALATAN) 0.005 % ophthalmic solution Place 1 drop into both eyes at bedtime.   Marland Kitchen loperamide (IMODIUM) 2 MG capsule Take 1 capsule (2 mg total) by mouth as needed for diarrhea or loose  stools.  Marland Kitchen loratadine (CLARITIN) 10 MG tablet Take 10 mg by mouth daily.  . memantine (NAMENDA) 10 MG tablet Take 1 tablet (10 mg total) by mouth daily.  . Multiple  Vitamins-Iron (MULTIVITAMIN/IRON) TABS Take 1 tablet by mouth daily.    . Probiotic Product (ALIGN) 4 MG CAPS Take 1 capsule by mouth daily.    Marland Kitchen telmisartan-hydrochlorothiazide (MICARDIS HCT) 40-12.5 MG tablet Take 1 tablet by mouth daily.   No facility-administered encounter medications on file as of 09/30/2017.     Activities of Daily Living No flowsheet data found.  Patient Care Team: Marin Olp, MD as PCP - General (Family Medicine)    Assessment:   This is a routine wellness examination for [place].  Exercise Activities and Dietary recommendations    Goals    . Patient Stated     Patient wants to maintain your health        Fall Risk Fall Risk  09/24/2017 03/24/2017 10/16/2016 09/17/2016 03/19/2016  Falls in the past year? No No Yes No Yes  Number falls in past yr: - - 1 - 1  Injury with Fall? - - Yes - No  Risk for fall due to : - - Other (Comment) - -  Risk for fall due to: Comment - - Tripped over something laying in the floor - -     Depression Screen PHQ 2/9 Scores 10/16/2016 08/09/2015 04/18/2015 03/23/2014  PHQ - 2 Score 0 0 0 0     Cognitive Function MMSE - Mini Mental State Exam 09/24/2017 03/24/2017  Orientation to time 2 4  Orientation to Place 4 4  Registration 3 3  Attention/ Calculation 0 0  Recall 0 0  Language- name 2 objects 2 2  Language- repeat 1 1  Language- follow 3 step command 3 3  Language- read & follow direction 1 1  Write a sentence 1 1  Copy design 0 0  Total score 17 19   Montreal Cognitive Assessment  09/17/2016 03/19/2016  Visuospatial/ Executive (0/5) 4 3  Naming (0/3) 3 3  Attention: Read list of digits (0/2) 1 1  Attention: Read list of letters (0/1) 0 1  Attention: Serial 7 subtraction starting at 100 (0/3) 2 3  Language: Repeat phrase (0/2) 2 2    Language : Fluency (0/1) 1 0  Abstraction (0/2) 2 2  Delayed Recall (0/5) 0 0  Orientation (0/6) 4 5  Total 19 20    will waive memory testing today as noted above  She does stay active; laundry    Immunization History  Administered Date(s) Administered  . Influenza, High Dose Seasonal PF 12/24/2016  . Influenza,inj,Quad PF,6+ Mos 01/30/2013, 01/08/2015  . Influenza-Unspecified 02/18/2014, 12/15/2015  . Pneumococcal Conjugate-13 08/09/2015  . Pneumococcal Polysaccharide-23 07/21/2011  . Td 11/04/2007  . Zoster 08/17/2007      Screening Tests Health Maintenance  Topic Date Due  . FOOT EXAM  06/23/2017  . MAMMOGRAM  07/09/2017  . TETANUS/TDAP  11/03/2017  . INFLUENZA VACCINE  11/04/2017  . HEMOGLOBIN A1C  03/29/2018  . OPHTHALMOLOGY EXAM  04/19/2018  . COLONOSCOPY  05/02/2020  . DEXA SCAN  Completed  . Hepatitis C Screening  Completed  . PNA vac Low Risk Adult  Completed       Plan:      PCP Notes To note They currently live in the basement of their dtr's home. 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 Dtr is still waiting to see where she will be working. Home has been purchased in [place], so they both may be moving soon   Health Maintenance Colonoscopy  04/2015 - repeat in 10 years  Mammogram due since 2017 -will schedule this    Needs a tetanus with pertussis 11/04/2017   Educated regarding the shingrix vaccines  Also had labs drawn today    Abnormal Screens  MMSE deferred as Dr. Delice Lesch just tested 09/24/2017  Referrals  None  Patient concerns; If she was still coughing, Dr. Yong Channel was going to write a rx; cough has not resolved and they are going out of town Tuesday.  Dr. Ansel Bong note from 6/24/ state  Viral sinusitis-discussed conservative care including use of Mucinex or Mucinex DM.  She can continue prn claritin when having issues. Opted against prednisone with glaucoma history. Discussed  if not improved within a week I would call in augmentin (basket note sent)   Nurse Concerns; As noted  Next PCP apt 01/28/2018     I have personally reviewed and noted the following in the patient's chart:   . Medical and social history . Use of alcohol, tobacco or illicit drugs  . Current medications and supplements . Functional ability and status . Nutritional status . Physical activity . Advanced directives . List of other physicians . Hospitalizations, surgeries, and ER visits in previous 12 months . Vitals . Screenings to include cognitive, depression, and falls . Referrals and appointments  In addition, I have reviewed and discussed with patient certain preventive protocols, quality metrics, and best practice recommendations. A written personalized care plan for preventive services as well as general preventive health recommendations were provided to patient.     Wynetta Fines, RN  09/30/2017

## 2017-10-01 ENCOUNTER — Encounter: Payer: Self-pay | Admitting: *Deleted

## 2017-10-01 ENCOUNTER — Other Ambulatory Visit: Payer: Self-pay

## 2017-10-01 DIAGNOSIS — D72829 Elevated white blood cell count, unspecified: Secondary | ICD-10-CM

## 2017-10-01 NOTE — Progress Notes (Signed)
I have personally reviewed the Medicare Annual Wellness Visit and agree with the assessment and plan.  Algis Greenhouse. Jerline Pain, MD 10/01/2017 9:22 AM

## 2017-10-15 ENCOUNTER — Other Ambulatory Visit (INDEPENDENT_AMBULATORY_CARE_PROVIDER_SITE_OTHER): Payer: Medicare Other

## 2017-10-15 DIAGNOSIS — D72829 Elevated white blood cell count, unspecified: Secondary | ICD-10-CM

## 2017-10-15 LAB — CBC WITH DIFFERENTIAL/PLATELET
Basophils Absolute: 0 10*3/uL (ref 0.0–0.1)
Basophils Relative: 0.4 % (ref 0.0–3.0)
EOS ABS: 0 10*3/uL (ref 0.0–0.7)
Eosinophils Relative: 0.4 % (ref 0.0–5.0)
HCT: 32.4 % — ABNORMAL LOW (ref 36.0–46.0)
HEMOGLOBIN: 11.1 g/dL — AB (ref 12.0–15.0)
LYMPHS ABS: 1.4 10*3/uL (ref 0.7–4.0)
Lymphocytes Relative: 16 % (ref 12.0–46.0)
MCHC: 34.3 g/dL (ref 30.0–36.0)
MCV: 88.8 fl (ref 78.0–100.0)
Monocytes Absolute: 0.6 10*3/uL (ref 0.1–1.0)
Monocytes Relative: 6.6 % (ref 3.0–12.0)
NEUTROS PCT: 76.6 % (ref 43.0–77.0)
Neutro Abs: 6.9 10*3/uL (ref 1.4–7.7)
Platelets: 325 10*3/uL (ref 150.0–400.0)
RBC: 3.65 Mil/uL — AB (ref 3.87–5.11)
RDW: 13.2 % (ref 11.5–15.5)
WBC: 9 10*3/uL (ref 4.0–10.5)

## 2017-10-19 ENCOUNTER — Telehealth: Payer: Self-pay | Admitting: Family Medicine

## 2017-10-19 ENCOUNTER — Other Ambulatory Visit: Payer: Self-pay | Admitting: Family Medicine

## 2017-10-19 NOTE — Telephone Encounter (Signed)
Reviewed in result notes 

## 2017-10-19 NOTE — Telephone Encounter (Signed)
Copied from [place] [id number]. Topic: Quick Communication - Lab Results >> Oct 18, 2017  4:15 PM Harriett Rush, [place] wrote: Called patient to inform them of 10/15/2017 lab results. When patient returns call, triage nurse may disclose results. >> Oct 19, 2017  1:07 PM Marin Olp L wrote: No PEC RN free for results.

## 2017-10-19 NOTE — Telephone Encounter (Deleted)
Copied from [place] [phone]. Topic: Quick Communication - Lab Results >> Oct 18, 2017  4:15 PM Harriett Rush, [place] wrote: Called patient to inform them of 10/15/2017 lab results. When patient returns call, triage nurse may disclose results. >> Oct 19, 2017  1:07 PM Marin Olp L wrote: No PEC RN free for results.

## 2017-10-20 ENCOUNTER — Encounter: Payer: Self-pay | Admitting: Family Medicine

## 2017-10-21 ENCOUNTER — Other Ambulatory Visit: Payer: Self-pay

## 2017-10-21 DIAGNOSIS — E119 Type 2 diabetes mellitus without complications: Secondary | ICD-10-CM

## 2017-10-21 MED ORDER — GLUCOSE BLOOD VI STRP: ORAL_STRIP | 1 refills | Status: DC

## 2017-10-21 MED ORDER — ONETOUCH DELICA LANCETS FINE MISC: 1 refills | Status: DC

## 2017-11-08 DIAGNOSIS — H401131 Primary open-angle glaucoma, bilateral, mild stage: Secondary | ICD-10-CM | POA: Diagnosis not present

## 2017-11-15 ENCOUNTER — Other Ambulatory Visit: Payer: Self-pay | Admitting: Family Medicine

## 2017-11-15 DIAGNOSIS — Z1231 Encounter for screening mammogram for malignant neoplasm of breast: Secondary | ICD-10-CM

## 2017-12-09 ENCOUNTER — Ambulatory Visit
Admission: RE | Admit: 2017-12-09 | Discharge: 2017-12-09 | Disposition: A | Discharge status: Home / Self Care | Payer: Medicare Other | Source: Ambulatory Visit | Attending: Family Medicine | Admitting: Family Medicine

## 2017-12-09 DIAGNOSIS — Z1231 Encounter for screening mammogram for malignant neoplasm of breast: Secondary | ICD-10-CM | POA: Diagnosis not present

## 2017-12-17 ENCOUNTER — Other Ambulatory Visit: Payer: Self-pay | Admitting: Family Medicine

## 2017-12-17 ENCOUNTER — Telehealth: Payer: Self-pay

## 2017-12-17 DIAGNOSIS — D649 Anemia, unspecified: Secondary | ICD-10-CM

## 2017-12-17 NOTE — Telephone Encounter (Signed)
Pt coming for labs 12/20/17. Please place future orders. Thank you.

## 2017-12-17 NOTE — Telephone Encounter (Signed)
Orders put in Epic for repeat CBC-Diff.

## 2017-12-20 ENCOUNTER — Other Ambulatory Visit (INDEPENDENT_AMBULATORY_CARE_PROVIDER_SITE_OTHER): Payer: Medicare Other

## 2017-12-20 DIAGNOSIS — D649 Anemia, unspecified: Secondary | ICD-10-CM

## 2017-12-20 LAB — CBC WITH DIFFERENTIAL/PLATELET
BASOS PCT: 0.7 % (ref 0.0–3.0)
Basophils Absolute: 0 10*3/uL (ref 0.0–0.1)
EOS PCT: 0.9 % (ref 0.0–5.0)
Eosinophils Absolute: 0 10*3/uL (ref 0.0–0.7)
HEMATOCRIT: 36.3 % (ref 36.0–46.0)
HEMOGLOBIN: 12.5 g/dL (ref 12.0–15.0)
LYMPHS PCT: 29.2 % (ref 12.0–46.0)
Lymphs Abs: 1.7 10*3/uL (ref 0.7–4.0)
MCHC: 34.5 g/dL (ref 30.0–36.0)
MCV: 86.6 fl (ref 78.0–100.0)
MONOS PCT: 8.6 % (ref 3.0–12.0)
Monocytes Absolute: 0.5 10*3/uL (ref 0.1–1.0)
Neutro Abs: 3.5 10*3/uL (ref 1.4–7.7)
Neutrophils Relative %: 60.6 % (ref 43.0–77.0)
Platelets: 297 10*3/uL (ref 150.0–400.0)
RBC: 4.19 Mil/uL (ref 3.87–5.11)
RDW: 13.2 % (ref 11.5–15.5)
WBC: 5.8 10*3/uL (ref 4.0–10.5)

## 2017-12-28 DIAGNOSIS — Z23 Encounter for immunization: Secondary | ICD-10-CM | POA: Diagnosis not present

## 2018-01-28 ENCOUNTER — Ambulatory Visit: Payer: Medicare Other | Admitting: Family Medicine

## 2018-02-22 ENCOUNTER — Encounter: Payer: Self-pay | Admitting: Family Medicine

## 2018-02-22 ENCOUNTER — Ambulatory Visit (INDEPENDENT_AMBULATORY_CARE_PROVIDER_SITE_OTHER): Payer: Medicare Other | Admitting: Family Medicine

## 2018-02-22 VITALS — BP 132/68 | HR 65 | Temp 98.1°F | Ht 62.0 in | Wt 113.4 lb

## 2018-02-22 DIAGNOSIS — E785 Hyperlipidemia, unspecified: Secondary | ICD-10-CM

## 2018-02-22 DIAGNOSIS — F320 Major depressive disorder, single episode, mild: Secondary | ICD-10-CM | POA: Diagnosis not present

## 2018-02-22 DIAGNOSIS — I10 Essential (primary) hypertension: Secondary | ICD-10-CM | POA: Diagnosis not present

## 2018-02-22 DIAGNOSIS — M85851 Other specified disorders of bone density and structure, right thigh: Secondary | ICD-10-CM | POA: Diagnosis not present

## 2018-02-22 DIAGNOSIS — E119 Type 2 diabetes mellitus without complications: Secondary | ICD-10-CM | POA: Diagnosis not present

## 2018-02-22 LAB — POCT GLYCOSYLATED HEMOGLOBIN (HGB A1C): HEMOGLOBIN A1C: 6.1 % — AB (ref 4.0–5.6)

## 2018-02-22 MED ORDER — ATORVASTATIN CALCIUM 10 MG PO TABS: 10.0000 mg | ORAL_TABLET | ORAL | 3 refills | Status: AC

## 2018-02-22 NOTE — Assessment & Plan Note (Signed)
S: Well controlled on Amaryl 1 mg in the past-we stopped Amaryl 1 mg last visit due to concern for potential hypoglycemia Lab Results  Component Value Date   HGBA1C 5.8 (A) 09/27/2017   HGBA1C 6.0 12/24/2016   HGBA1C 6.2 06/23/2016   A/P: stable with a1c 6.1 today- continue without amaryl

## 2018-02-22 NOTE — Assessment & Plan Note (Signed)
S: controlled on telmisartan hydrochlorothiazide 40-25 mg BP Readings from Last 3 Encounters:  02/22/18 132/68  09/30/17 122/60  09/27/17 122/68  A/P: We discussed blood pressure goal of <140/90. Continue current meds- they ask about stopping this medication in light of memory loss- we discussed potential for nonfatal MI or CVA and ultimately opted to continue current medication

## 2018-02-22 NOTE — Progress Notes (Signed)
Subjective:  [name] is a 72 y.o. year old very pleasant female patient who presents for/with See problem oriented charting ROS- No chest pain or shortness of breath. No headache or blurry vision.  Some memory issues  Past Medical History-  Patient Active Problem List   Diagnosis Date Noted  . Current mild episode of major depressive disorder without prior episode ([place]) 03/24/2017    Priority: High  . Mild dementia ([place]) 08/09/2015    Priority: High  . Fecal incontinence 03/23/2014    Priority: High  . Diabetes mellitus type II, controlled ([place]) 10/19/2006    Priority: High  . [place] type II atrioventricular block 04/21/2016    Priority: Medium  . History of adenomatous polyp of colon 08/09/2015    Priority: Medium  . H/O partial thyroidectomy 03/23/2014    Priority: Medium  . Hyperlipidemia 10/19/2006    Priority: Medium  . Essential hypertension 10/19/2006    Priority: Medium  . Glaucoma 03/23/2014    Priority: Low  . Family history of malignant neoplasm of gastrointestinal tract 08/26/2010    Priority: Low  . Anemia 08/26/2010    Priority: Low  . Esophageal stricture 07/16/2010    Priority: Low  . Osteopenia 04/22/2007    Priority: Low  . Syncope and collapse 04/09/2016    Medications- reviewed and updated Current Outpatient Medications  Medication Sig Dispense Refill  . atorvastatin (LIPITOR) 10 MG tablet Take 1 tablet (10 mg total) by mouth every other day. 46 tablet 3  . brimonidine (ALPHAGAN) 0.2 % ophthalmic solution PLACE ONE DROP IN [facility] EYE THREE TIMES DAILY  2  . Calcium Carb-Cholecalciferol (CALCIUM-VITAMIN D3) 600-400 MG-UNIT CAPS Take 2 capsules by mouth daily.    . Carboxymethylcellulose Sodium (LUBRICANT EYE DROPS OP) Place 1 drop into both eyes at bedtime.    Marland Kitchen escitalopram (LEXAPRO) 5 MG tablet Take 1 tablet (5 mg total) by mouth at bedtime. 30 tablet 6  . latanoprost (XALATAN) 0.005 % ophthalmic solution Place 1 drop into both eyes at  bedtime.     Marland Kitchen loperamide (IMODIUM) 2 MG capsule Take 1 capsule (2 mg total) by mouth as needed for diarrhea or loose stools. 30 capsule 0  . loratadine (CLARITIN) 10 MG tablet Take 10 mg by mouth daily.    . memantine (NAMENDA) 10 MG tablet Take 1 tablet (10 mg total) by mouth daily. 30 tablet 11  . Multiple Vitamins-Iron (MULTIVITAMIN/IRON) TABS Take 1 tablet by mouth daily.      . Probiotic Product (ALIGN) 4 MG CAPS Take 1 capsule by mouth daily.      Marland Kitchen telmisartan-hydrochlorothiazide (MICARDIS HCT) 40-12.5 MG tablet Take 1 tablet by mouth daily. 90 tablet 3   No current facility-administered medications for this visit.    Objective: BP 132/68   Pulse 65   Temp 98.1 F (36.7 C) (Oral)   Ht 5\' 2"  (1.575 m)   Wt 113 lb 6.4 oz (51.4 kg)   SpO2 98%   BMI 20.74 kg/m  Gen: NAD, resting comfortably Some cerumen in left ear canal-able to curette out some.  Did not do full removal as some cerumen close to tympanic membrane CV: RRR no murmurs rubs or gallops Lungs: CTAB no crackles, wheeze, rhonchi Abdomen: soft/nontender/nondistended/normal bowel sounds.  Ext: no edema Skin: warm, dry  Assessment/Plan:  Hyperlipidemia S: well controlled on  atorvastatin 10mg - patient wants to reduce overall medicine burden if possible Lab Results  Component Value Date   CHOL 136 09/30/2017  HDL 65.70 09/30/2017   LDLCALC 55 09/30/2017   LDLDIRECT 41.0 06/23/2016   TRIG 80.0 09/30/2017   CHOLHDL 2 09/30/2017   A/P: since cholesterol has looked so good- we opted to reduce to atorvastatin 10mg  every other day- can repeat cholesterol #s next visit on reduced regimen  Osteopenia S: takes MV with calcium and vitamin D. Also takes calcium 600mg - with vitamin D 400 units- two pills twice daily.   Last DEXA 2011- they ask about stopping calcium/vitamin d A/P: doubt we can stop these meds- update bone density for more information  Essential hypertension S: controlled on telmisartan  hydrochlorothiazide 40-25 mg BP Readings from Last 3 Encounters:  02/22/18 132/68  09/30/17 122/60  09/27/17 122/68  A/P: We discussed blood pressure goal of <140/90. Continue current meds- they ask about stopping this medication in light of memory loss- we discussed potential for nonfatal MI or CVA and ultimately opted to continue current medication  Diabetes mellitus type II, controlled ([place]) S: Well controlled on Amaryl 1 mg in the past-we stopped Amaryl 1 mg last visit due to concern for potential hypoglycemia Lab Results  Component Value Date   HGBA1C 5.8 (A) 09/27/2017   HGBA1C 6.0 12/24/2016   HGBA1C 6.2 06/23/2016   A/P: stable with a1c 6.1 today- continue without amaryl  Current mild episode of major depressive disorder without prior episode ([place]) S: Depression reasonably controlled on lexapro 5mg .  Started by Dr. Delice Lesch Depression screen [facility] 2/9 09/30/2017  Decreased Interest 0  Down, Depressed, Hopeless 0  PHQ - 2 Score 0  A/P: Stable-continue current medications  Future Appointments  Date Time Provider [place]  05/09/2018  3:00 PM Cameron Sprang, MD [facility] None    68-month follow-up due to excellent A1c control Patient updates me will be moving to [facility] to be with family-we will certainly miss her  Lab/Order associations: Controlled type 2 diabetes mellitus without complication, without long-term current use of insulin ([place]) - Plan: POCT glycosylated hemoglobin (Hb A1C)  Hyperlipidemia, unspecified hyperlipidemia type  Osteopenia of right hip - Plan: DG Bone Density  Essential hypertension  Current mild episode of major depressive disorder without prior episode ([place])  Meds ordered this encounter  Medications  . atorvastatin (LIPITOR) 10 MG tablet    Sig: Take 1 tablet (10 mg total) by mouth every other day.    Dispense:  46 tablet    Refill:  3   Return precautions advised.  [name], MD

## 2018-02-22 NOTE — Assessment & Plan Note (Signed)
S: Depression reasonably controlled on lexapro 5mg .  Started by Dr. Delice Lesch Depression screen [facility] 2/9 09/30/2017  Decreased Interest 0  Down, Depressed, Hopeless 0  PHQ - 2 Score 0  A/P: Stable-continue current medications

## 2018-02-22 NOTE — Assessment & Plan Note (Signed)
S: takes MV with calcium and vitamin D. Also takes calcium 600mg - with vitamin D 400 units- two pills twice daily.   Last DEXA 2011- they ask about stopping calcium/vitamin d A/P: doubt we can stop these meds- update bone density for more information

## 2018-02-22 NOTE — Patient Instructions (Addendum)
Health Maintenance Due  Topic Date Due  . TETANUS/TDAP  11/03/2017   since cholesterol has looked so good- we opted to reduce to atorvastatin 10mg  every other day- can repeat cholesterol #s next visit on reduced regimen  Schedule your bone density test at check out desk. You may also call directly to X-ray at [phone] to schedule an appointment that is convenient for you.  - located [address] across the [address] from [place] - in the basement - you do need an appointment for the bone density tests.   Total amount of calcium needs to be 1200mg  per day between multivitamin, calcium pill, and diet. I want you to get 800- 1000 units of vitamin D per day as well  Team please give them a calcium handout  Mineral oil for ear full of wax Purchase mineral oil from laxative aisle Lay down on your side with ear that is bothering you facing up Use 3-4 drops with a dropper and place in ear for 30 seconds Place cotton swab outside of ear Turn to other side and allow this to drain Repeat 3-4 x a day Return to see [place] if not improving within a few days

## 2018-02-22 NOTE — Assessment & Plan Note (Signed)
S: well controlled on  atorvastatin 10mg - patient wants to reduce overall medicine burden if possible Lab Results  Component Value Date   CHOL 136 09/30/2017   HDL 65.70 09/30/2017   LDLCALC 55 09/30/2017   LDLDIRECT 41.0 06/23/2016   TRIG 80.0 09/30/2017   CHOLHDL 2 09/30/2017   A/P: since cholesterol has looked so good- we opted to reduce to atorvastatin 10mg  every other day- can repeat cholesterol #s next visit on reduced regimen

## 2018-03-10 ENCOUNTER — Ambulatory Visit (INDEPENDENT_AMBULATORY_CARE_PROVIDER_SITE_OTHER)
Admission: RE | Admit: 2018-03-10 | Discharge: 2018-03-10 | Disposition: A | Discharge status: Home / Self Care | Payer: Medicare Other | Source: Ambulatory Visit | Attending: Family Medicine | Admitting: Family Medicine

## 2018-03-10 DIAGNOSIS — M85851 Other specified disorders of bone density and structure, right thigh: Secondary | ICD-10-CM | POA: Diagnosis not present

## 2018-05-09 ENCOUNTER — Encounter: Payer: Self-pay | Admitting: Neurology

## 2018-05-09 ENCOUNTER — Ambulatory Visit (INDEPENDENT_AMBULATORY_CARE_PROVIDER_SITE_OTHER): Payer: Medicare Other | Admitting: Neurology

## 2018-05-09 ENCOUNTER — Other Ambulatory Visit: Payer: Self-pay

## 2018-05-09 VITALS — BP 134/66 | HR 77 | Ht 61.0 in | Wt 112.0 lb

## 2018-05-09 DIAGNOSIS — F0391 Unspecified dementia with behavioral disturbance: Secondary | ICD-10-CM

## 2018-05-09 DIAGNOSIS — F03B18 Unspecified dementia, moderate, with other behavioral disturbance: Secondary | ICD-10-CM

## 2018-05-09 MED ORDER — MEMANTINE HCL 10 MG PO TABS: 10.0000 mg | ORAL_TABLET | Freq: Two times a day (BID) | ORAL | 3 refills | Status: DC

## 2018-05-09 MED ORDER — ESCITALOPRAM OXALATE 10 MG PO TABS: 10.0000 mg | ORAL_TABLET | Freq: Every day | ORAL | 3 refills | Status: DC

## 2018-05-09 NOTE — Patient Instructions (Signed)
1. Increase Memantine 10mg : take 1 tablet twice a day  2. Increase Escitalopram (Lexapro) to 10mg  daily. With your current bottle of Escitalopram 5mg , take 2 tablets daily. Once done, your new bottle will be for Escitalopram 10mg , take 1 tablet daily  3. If still in the area, follow-up in 6 months, call for any changes  FALL PRECAUTIONS: Be cautious when walking. Scan the area for obstacles that may increase the risk of trips and falls. When getting up in the mornings, sit up at the edge of the bed for a few minutes before getting out of bed. Consider elevating the bed at the head end to avoid drop of blood pressure when getting up. Walk always in a well-lit room (use night lights in the walls). Avoid area rugs or power cords from appliances in the middle of the walkways. Use a walker or a cane if necessary and consider physical therapy for balance exercise. Get your eyesight checked regularly.  FINANCIAL OVERSIGHT: Supervision, especially oversight when making financial decisions or transactions is also recommended.  HOME SAFETY: Consider the safety of the kitchen when operating appliances like stoves, microwave oven, and blender. Consider having supervision and share cooking responsibilities until no longer able to participate in those. Accidents with firearms and other hazards in the house should be identified and addressed as well.  DRIVING: Regarding driving, in patients with progressive memory problems, driving will be impaired. We advise to have someone else do the driving if trouble finding directions or if minor accidents are reported. Independent driving assessment is available to determine safety of driving.  ABILITY TO BE LEFT ALONE: If patient is unable to contact 911 operator, consider using LifeLine, or when the need is there, arrange for someone to stay with patients. Smoking is a fire hazard, consider supervision or cessation. Risk of wandering should be assessed by caregiver and if  detected at any point, supervision and safe proof recommendations should be instituted.  MEDICATION SUPERVISION: Inability to self-administer medication needs to be constantly addressed. Implement a mechanism to ensure safe administration of the medications.  RECOMMENDATIONS FOR ALL PATIENTS WITH MEMORY PROBLEMS: 1. Continue to exercise (Recommend 30 minutes of walking everyday, or 3 hours every week) 2. Increase social interactions - continue going to [place] and enjoy social gatherings with friends and family 3. Eat healthy, avoid fried foods and eat more fruits and vegetables 4. Maintain adequate blood pressure, blood sugar, and blood cholesterol level. Reducing the risk of stroke and cardiovascular disease also helps promoting better memory. 5. Avoid stressful situations. Live a simple life and avoid aggravations. Organize your time and prepare for the next day in anticipation. 6. Sleep well, avoid any interruptions of sleep and avoid any distractions in the bedroom that may interfere with adequate sleep quality 7. Avoid sugar, avoid sweets as there is a strong link between excessive sugar intake, diabetes, and cognitive impairment The Mediterranean diet has been shown to help patients reduce the risk of progressive memory disorders and reduces cardiovascular risk. This includes eating fish, eat fruits and green leafy vegetables, nuts like almonds and hazelnuts, walnuts, and also use olive oil. Avoid fast foods and fried foods as much as possible. Avoid sweets and sugar as sugar use has been linked to worsening of memory function.  There is always a concern of gradual progression of memory problems. If this is the case, then we may need to adjust level of care according to patient needs. Support, both to the patient and caregiver, should then  be put into place.

## 2018-05-09 NOTE — Progress Notes (Signed)
NEUROLOGY FOLLOW UP OFFICE NOTE  [name] [id number] 09/11/1945  HISTORY OF PRESENT ILLNESS: I had the pleasure of seeing [name] in follow-up in the neurology clinic on 05/09/2018. She is again accompanied by her husband and daughter who help supplement the history today. 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 MMSE 17/30 in June 2019 (19/30 in December 2018). She had side effects on Donepezil and Rivastigmine, and has been taking Memantine 10mg  daily without side effects. Since her last visit, she feels she is doing fine. Her husband and daughter have noticed worsening of memory, they now have a checklist for her medications AM and PM. Her husband took over bills a year ago, she was having difficulties processing her emails, which is where some of the bills were being sent. She does not drive. She is having more frustration and agitation when she forgets or misunderstands her husband. He cites some examples: she wanted to shred all the newspapers, saying this is what she has always done, but in the past she would only shred the address labels. She states this is what she was doing with her prior job, that she would shred the whole thing, "to me that's how it should be done." Her husband reports that they have always had a hand towel in the bathroom, now she uses a full body towel and states this is how it is always done. She becomes tearful stating they have always had one towel growing up with her mother. She wants to be home more and has stopped going to bible study and church, "I just don't want to." This morning she could not find where her pants were, when there was a pile of pants in the room. She states she thinks her husband hears something differently. No paranoia or hallucinations. She denies any headaches, dizziness, vision changes, urinary incontinence, no falls. They feel her walking is slower, she takes small slower steps and shuffles, she states she  takes "little itty bitty steps to be more careful."  HPI 03/19/2016: This is a pleasant 73 yo RH woman with a history of hypertension, hyperlipidemia, diabetes, fecal incontinence, who presented for evaluation of report of abnormal brain MRI ordered for memory loss. She feels her memory is not as good as it was, she has noticed a gradual decline with memory for names, dates, and conversations. 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 Her motor functions are not where they used to be. She now questions her ability to know how to get places she has been to multiple times in the past. She left groceries in the back of her car. One time she double paid their church in one month. She denies any missed medications. Her daughter has noticed she would repeat the same conversation. She has more difficulty organizing a meal and does not cook very much anymore. They deny any gait changes, but report she has fallen a couple of times due to missteps. She denies any urinary incontinence. She has a chronic history of fecal incontinence since she delivered her children, and had a [employer] device stimulating her sphincter muscle. This has helped, but she continues to have diarrhea at least once a week. Her husband reports a weight loss of 25 lbs in the past year. Her appetite is not as good as it was. Sleep is good. She denies any headaches,  vision changes, dysarthria/dysphagia, neck/back pain, focal numbness/tingling/weakness. No anosmia. She has mild tremors when anxious. They report an incident of loss of consciousness a week after her MRI, she walked into the bathroom then next thing she knew her head was on the cabinet. She was not gone for a long time per family, probably 5 minutes, but she was unable to tell them what had happened on how she hit her forehead. She may have been dizzy, denied any confusion or headache. No other  episodes of loss of consciousness in the past. There is no family history of dementia. She denies any alcohol intake, no other head injuries. She was started on Aricept which she is tolerating without side effects.  I personally reviewed MRI brain with and without contrast done 01/19/16 which did not show any acute changes. There was mild chronic microvascular disease and mild volume loss. There was note of enlargement of the lateral and third ventricles which is borderline disproportional to the degree of parenchymal volume loss and increased from 2007. No transependymal flow seen, patent cerebral aqueduct  PAST MEDICAL HISTORY: Past Medical History:  Diagnosis Date  . Allergy   . Arthritis    right arm  . Cataract    bilateral and removal  . Environmental allergies   . Fecal incontinence    sphincter stim placed 07-2014  . GERD (gastroesophageal reflux disease)   . Glaucoma, both eyes   . History of adenomatous polyp of colon   . History of diverticulitis    duodenal 2012-- resolved  . History of hiatal hernia   . Hyperlipidemia   . Hypertension   . Osteopenia   . Sigmoid diverticulosis   . Status post dilation of esophageal narrowing   . Thyroid disease   . Type 2 diabetes mellitus ([place])   . Wears glasses     MEDICATIONS: Current Outpatient Medications on File Prior to Visit  Medication Sig Dispense Refill  . atorvastatin (LIPITOR) 10 MG tablet Take 1 tablet (10 mg total) by mouth every other day. 46 tablet 3  . brimonidine (ALPHAGAN) 0.2 % ophthalmic solution PLACE ONE DROP IN [facility] EYE THREE TIMES DAILY  2  . Calcium Carb-Cholecalciferol (CALCIUM-VITAMIN D3) 600-400 MG-UNIT CAPS Take 2 capsules by mouth daily.    . Carboxymethylcellulose Sodium (LUBRICANT EYE DROPS OP) Place 1 drop into both eyes at bedtime.    Marland Kitchen escitalopram (LEXAPRO) 5 MG tablet Take 1 tablet (5 mg total) by mouth at bedtime. 30 tablet 6  . latanoprost (XALATAN) 0.005 % ophthalmic solution Place 1 drop  into both eyes at bedtime.     Marland Kitchen loperamide (IMODIUM) 2 MG capsule Take 1 capsule (2 mg total) by mouth as needed for diarrhea or loose stools. 30 capsule 0  . loratadine (CLARITIN) 10 MG tablet Take 10 mg by mouth daily.    . memantine (NAMENDA) 10 MG tablet Take 1 tablet (10 mg total) by mouth daily. 30 tablet 11  . Multiple Vitamins-Iron (MULTIVITAMIN/IRON) TABS Take 1 tablet by mouth daily.      . Probiotic Product (ALIGN) 4 MG CAPS Take 1 capsule by mouth daily.      Marland Kitchen telmisartan-hydrochlorothiazide (MICARDIS HCT) 40-12.5 MG tablet Take 1 tablet by mouth daily. 90 tablet 3   No current facility-administered medications on file prior to visit.     ALLERGIES: Allergies  Allergen Reactions  . Exelon [Rivastigmine Tartrate] Rash  . Lisinopril Diarrhea and Other (See Comments)    REACTION: hallucinations/diarrhea  . Metformin And  Related Diarrhea    FAMILY HISTORY: Family History  Problem Relation Age of Onset  . Diabetes Mother   . Heart disease Mother        CABG 77 in 42  . Heart attack Mother   . Stroke Mother   . Cancer Father 60       liver  . Colon cancer Father   . Colon cancer Maternal Grandmother   . Pancreatic cancer Maternal Grandmother   . Stomach cancer Maternal Grandmother   . Colon polyps Neg Hx   . Esophageal cancer Neg Hx   . Rectal cancer Neg Hx   . Breast cancer Neg Hx     SOCIAL HISTORY: Social History   Socioeconomic History  . Marital status: Married    Spouse name: Not on file  . Number of children: 2  . Years of education: Not on file  . Highest education level: Not on file  Occupational History  . Occupation: retired    Fish farm manager: RETIRED  Social Needs  . Financial resource strain: Not on file  . Food insecurity:    Worry: Not on file    Inability: Not on file  . Transportation needs:    Medical: Not on file    Non-medical: Not on file  Tobacco Use  . Smoking status: Never Smoker  . Smokeless tobacco: Never Used  Substance and  Sexual Activity  . Alcohol use: No    Alcohol/week: 0.0 standard drinks  . Drug use: No  . Sexual activity: Not on file  Lifestyle  . Physical activity:    Days per week: Not on file    Minutes per session: Not on file  . Stress: Not on file  Relationships  . Social connections:    Talks on phone: Not on file    Gets together: Not on file    Attends religious service: Not on file    Active member of club or organization: Not on file    Attends meetings of clubs or organizations: Not on file    Relationship status: Not on file  . Intimate partner violence:    Fear of current or ex partner: Not on file    Emotionally abused: Not on file    Physically abused: Not on file    Forced sexual activity: Not on file  Other Topics Concern  . Not on file  Social History Narrative   Married 1967. 2 children (daughters). 7 grandkids.       Retired-[employer] in [place]: reading      Daily caffeine    REVIEW OF SYSTEMS: Constitutional: No fevers, chills, or sweats, no generalized fatigue, change in appetite Eyes: No visual changes, double vision, eye pain Ear, nose and throat: No hearing loss, ear pain, nasal congestion, sore throat Cardiovascular: No chest pain, palpitations Respiratory:  No shortness of breath at rest or with exertion, wheezes GastrointestinaI: No nausea, vomiting, diarrhea, abdominal pain,+ fecal incontinence Genitourinary:  No dysuria, urinary retention or frequency Musculoskeletal:  No neck pain, back pain Integumentary: No rash, pruritus, skin lesions Neurological: as above Psychiatric: No depression, insomnia, anxiety Endocrine: No palpitations, fatigue, diaphoresis, mood swings, change in appetite, change in weight, increased thirst Hematologic/Lymphatic:  No anemia, purpura, petechiae. Allergic/Immunologic: no itchy/runny eyes, nasal congestion, recent allergic reactions, rashes  PHYSICAL EXAM: Vitals:   05/09/18 1453  BP: 134/66    Pulse: 77  SpO2: 99%   General: No acute distress, became tearful and slightly  agitated/defensive with husband today Head:  Normocephalic/atraumatic Neck: supple, no paraspinal tenderness, full range of motion Heart:  Regular rate and rhythm Lungs:  Clear to auscultation bilaterally Back: No paraspinal tenderness Skin/Extremities: No rash, no edema Neurological Exam: alert and oriented to person. Did not know city/state, month is January/Christmas season. No aphasia or dysarthria. Fund of knowledge is reduced.  Recent and remote memory are impaired  Attention and concentration are reduced, unable to spell WORLD forward or backward.  Able to name objects and repeat phrases. Difficulty reading and making a sentence. Difficulty with clock drawing, 1/5 MMSE - Mini Mental State Exam 05/09/2018 09/30/2017 09/24/2017  Not completed: - (No Data) -  Orientation to time 0 - 2  Orientation to Place 1 - 4  Registration 3 - 3  Attention/ Calculation 0 - 0  Recall 0 - 0  Language- name 2 objects 2 - 2  Language- repeat 1 - 1  Language- follow 3 step command 3 - 3  Language- read & follow direction 0 - 1  Write a sentence 0 - 1  Copy design 0 - 0  Total score 10 - 17    Montreal Cognitive Assessment  09/17/2016 03/19/2016  Visuospatial/ Executive (0/5) 4 3  Naming (0/3) 3 3  Attention: Read list of digits (0/2) 1 1  Attention: Read list of letters (0/1) 0 1  Attention: Serial 7 subtraction starting at 100 (0/3) 2 3  Language: Repeat phrase (0/2) 2 2  Language : Fluency (0/1) 1 0  Abstraction (0/2) 2 2  Delayed Recall (0/5) 0 0  Orientation (0/6) 4 5  Total 19 20   Cranial nerves: Pupils equal, round, reactive to light.  Extraocular movements intact with no nystagmus. Visual fields full. Facial sensation intact. No facial asymmetry. Tongue, uvula, palate midline.  Motor: Bulk and tone normal, no cogwheeling, muscle strength 5/5 throughout with no pronator drift.  Sensation to light touch intact.   No extinction to double simultaneous stimulation.  Deep tendon reflexes +1 throughout, toes downgoing.  Finger to nose testing intact.  Gait narrow-based and steady, able to tandem walk adequately. Fair arm swing, no magnetic gait noted. Romberg negative.   IMPRESSION: This is a pleasant 73 yo RH woman with a history of hypertension, hyperlipidemia, diabetes, fecal incontinence, with worsening memory. MRI brain showed diffuse atrophy and borderline disproportional enlargement of the ventricles to the degree of parenchymal volume loss. There was question about normal pressure hydrocephalus, however she does not have any other features of NPH except for the memory changes. Family notes smaller steps, no magnetic gait noted today, no urinary incontinence. MMSE is 10/30, there has been interval decline, which family has noticed as well. Increase Memantine to 10mg  BID. Increase Lexapro to 10mg  daily. They will be moving closer to [facility] and will be looking for a neurologist closer to their new home. We discussed caregiver fatigue, as well as eventually needing more help at home. She does not drive. We again discussed the importance of control of vascular risk factors, physical exercise, and brain stimulation exercises for brain health. She will follow-up in 6 months if still in the area, family knows to call for any changes.   Thank you for allowing me to participate in her care.  Please do not hesitate to call for any questions or concerns.  The duration of this appointment visit was 30 minutes of face-to-face time with the patient.  Greater than 50% of this time was spent in counseling, explanation of  diagnosis, planning of further management, and coordination of care.   Ellouise Newer, M.D.   CC: Dr. Yong Channel

## 2018-05-10 DIAGNOSIS — H401131 Primary open-angle glaucoma, bilateral, mild stage: Secondary | ICD-10-CM | POA: Diagnosis not present

## 2018-05-10 DIAGNOSIS — E119 Type 2 diabetes mellitus without complications: Secondary | ICD-10-CM | POA: Diagnosis not present

## 2018-05-10 LAB — HM DIABETES EYE EXAM

## 2018-05-11 ENCOUNTER — Encounter (HOSPITAL_COMMUNITY): Payer: Self-pay

## 2018-05-11 ENCOUNTER — Ambulatory Visit: Payer: Medicare Other | Admitting: Physician Assistant

## 2018-05-11 ENCOUNTER — Emergency Department (HOSPITAL_COMMUNITY): Payer: Medicare Other

## 2018-05-11 ENCOUNTER — Emergency Department (HOSPITAL_COMMUNITY)
Admission: EM | Admit: 2018-05-11 | Discharge: 2018-05-11 | Disposition: A | Discharge status: Home / Self Care | Payer: Medicare Other | Attending: Emergency Medicine | Admitting: Emergency Medicine

## 2018-05-11 DIAGNOSIS — I10 Essential (primary) hypertension: Secondary | ICD-10-CM | POA: Insufficient documentation

## 2018-05-11 DIAGNOSIS — Z79899 Other long term (current) drug therapy: Secondary | ICD-10-CM | POA: Diagnosis not present

## 2018-05-11 DIAGNOSIS — E860 Dehydration: Secondary | ICD-10-CM

## 2018-05-11 DIAGNOSIS — R55 Syncope and collapse: Secondary | ICD-10-CM | POA: Diagnosis not present

## 2018-05-11 DIAGNOSIS — E119 Type 2 diabetes mellitus without complications: Secondary | ICD-10-CM | POA: Diagnosis not present

## 2018-05-11 DIAGNOSIS — I959 Hypotension, unspecified: Secondary | ICD-10-CM | POA: Diagnosis not present

## 2018-05-11 LAB — BASIC METABOLIC PANEL
Anion gap: 9 (ref 5–15)
BUN: 25 mg/dL — ABNORMAL HIGH (ref 8–23)
CO2: 26 mmol/L (ref 22–32)
Calcium: 8.4 mg/dL — ABNORMAL LOW (ref 8.9–10.3)
Chloride: 101 mmol/L (ref 98–111)
Creatinine, Ser: 1.16 mg/dL — ABNORMAL HIGH (ref 0.44–1.00)
GFR calc Af Amer: 54 mL/min — ABNORMAL LOW (ref >60)
GFR, EST NON AFRICAN AMERICAN: 47 mL/min — AB (ref >60)
Glucose, Bld: 183 mg/dL — ABNORMAL HIGH (ref 70–99)
POTASSIUM: 3 mmol/L — AB (ref 3.5–5.1)
Sodium: 136 mmol/L (ref 135–145)

## 2018-05-11 LAB — CBC
HEMATOCRIT: 33.1 % — AB (ref 36.0–46.0)
HEMOGLOBIN: 10.9 g/dL — AB (ref 12.0–15.0)
MCH: 30.4 pg (ref 26.0–34.0)
MCHC: 32.9 g/dL (ref 30.0–36.0)
MCV: 92.2 fL (ref 80.0–100.0)
Platelets: 230 10*3/uL (ref 150–400)
RBC: 3.59 MIL/uL — ABNORMAL LOW (ref 3.87–5.11)
RDW: 12 % (ref 11.5–15.5)
WBC: 8.7 10*3/uL (ref 4.0–10.5)
nRBC: 0 % (ref 0.0–0.2)

## 2018-05-11 LAB — LACTIC ACID, PLASMA: Lactic Acid, Venous: 1 mmol/L (ref 0.5–1.9)

## 2018-05-11 LAB — TROPONIN I

## 2018-05-11 LAB — CBG MONITORING, ED: Glucose-Capillary: 151 mg/dL — ABNORMAL HIGH (ref 70–99)

## 2018-05-11 LAB — D-DIMER, QUANTITATIVE: D-Dimer, Quant: 0.3 ug/mL-FEU (ref 0.00–0.50)

## 2018-05-11 MED ORDER — SODIUM CHLORIDE 0.9 % IV BOLUS
2000.0000 mL | Freq: Once | INTRAVENOUS | Status: AC
  Administered 2018-05-11: 2000 mL via INTRAVENOUS

## 2018-05-11 MED ORDER — SODIUM CHLORIDE 0.9% FLUSH
3.0000 mL | Freq: Once | INTRAVENOUS | Status: AC
  Administered 2018-05-11: 3 mL via INTRAVENOUS

## 2018-05-11 NOTE — ED Triage Notes (Addendum)
Patient arrived via [facility] from home.  Near syncope. Walking in her closet and got dizzy and felt as if she was going to pass out so laid down. Unsure of LOC. Denies falling.   Bp- low 80s when EMS arrived.   450 NS given per ems   bp-96/46 [id number] P-64 (12 lead border line brady per ems)  98% RA    Behaving to her normal according to family.   A/Ox3.  (didn't know day of week per ems)   Hx. Dementia,Hypertension, hyperlipidemia  20g right ac   Orthostatic dropped from 91/46 to 87/46

## 2018-05-11 NOTE — ED Notes (Signed)
Patient aware we need urine sample. Patient will call out for assistance when ready to void. Patient made aware and family made aware to not get up alone due to low blood pressure.

## 2018-05-11 NOTE — ED Notes (Signed)
Patient ambulated to restroom with 1 assist. No problems. Provided urine sample.

## 2018-05-11 NOTE — ED Notes (Signed)
Bed: AT55 Expected date:  Expected time:  Means of arrival:  Comments: EMS/near-syncope

## 2018-05-11 NOTE — ED Notes (Signed)
Patient ambulated with tech to bathroom. 1 assist and no problems.

## 2018-05-14 NOTE — ED Provider Notes (Signed)
[facility] DEPT Provider Note   CSN: [id number] Arrival date & time: 05/11/18  1019     History   Chief Complaint Chief Complaint  Patient presents with  . Near Syncope    HPI [name] is a 73 y.o. female.  HPI Patient is a 73 year old female presents the emergency department after a reported syncopal episode while at home today with her husband.  She developed cough through the night and thus the husband had scheduled her an outpatient appointment with her primary care physician.  The patient was preparing to go to the appointment and was dressing in her closet when the husband heard a fall.  He went in and found the patient laying on the floor.  Patient denies preceding chest pain or palpitations.  She said she felt herself becoming lightheaded knew she was about to pass out that she lightly lowered herself down to the ground.  She did not fall.  She did not lose consciousness.  At this time and laying here she is without chest pain or abdominal pain.  Denies back and neck pain.  Denies recent fevers or chills.  Reports no shortness of breath.  Does admit to new cough since last night.  No unilateral leg swelling.  She has a history of persistent loose stools but reports no significant change in her bowel habits.  Denies nausea and vomiting.  Denies decreased oral intake.  Reportedly orthostatic for EMS.  Family reports baseline mental status.  She has a history of mild dementia although she can provide a significant amount of today's history.   Past Medical History:  Diagnosis Date  . Allergy   . Arthritis    right arm  . Cataract    bilateral and removal  . Environmental allergies   . Fecal incontinence    sphincter stim placed 07-2014  . GERD (gastroesophageal reflux disease)   . Glaucoma, both eyes   . History of adenomatous polyp of colon   . History of diverticulitis    duodenal 2012-- resolved  . History of hiatal hernia   .  Hyperlipidemia   . Hypertension   . Osteopenia   . Sigmoid diverticulosis   . Status post dilation of esophageal narrowing   . Thyroid disease   . Type 2 diabetes mellitus ([place])   . Wears glasses     Patient Active Problem List   Diagnosis Date Noted  . Current mild episode of major depressive disorder without prior episode ([place]) 03/24/2017  . Mobitz type II atrioventricular block 04/21/2016  . Syncope and collapse 04/09/2016  . History of adenomatous polyp of colon 08/09/2015  . Mild dementia ([place]) 08/09/2015  . Glaucoma 03/23/2014  . H/O partial thyroidectomy 03/23/2014  . Fecal incontinence 03/23/2014  . Family history of malignant neoplasm of gastrointestinal tract 08/26/2010  . Anemia 08/26/2010  . Esophageal stricture 07/16/2010  . Osteopenia 04/22/2007  . Hyperlipidemia 10/19/2006  . Essential hypertension 10/19/2006  . Diabetes mellitus type II, controlled ([place]) 10/19/2006    Past Surgical History:  Procedure Laterality Date  . ANAL RECTAL MANOMETRY N/A 03/20/2013  . BUNIONECTOMY Bilateral 1985  . CARDIOVASCULAR STRESS TEST  02-13-2005   normal perfusion study/  no ischemia/  normal LV function and wall motion , ef 86%  . CARPAL TUNNEL RELEASE Left 08-05-2006  . CATARACT EXTRACTION    . COLONOSCOPY    . COLONOSCOPY W/ POLYPECTOMY  last one 02-13-2010  . ESOPHAGOGASTRODUODENOSCOPY (EGD) WITH ESOPHAGEAL DILATION  last  one 09-11-2010  . KNEE ARTHROSCOPY Right 2013  . LAPAROSCOPIC NISSEN FUNDOPLICATION  1749  . peripheral neuro stimulator  07-2014   sphincter stimulator  . POLYPECTOMY    . RECTAL ULTRASOUND N/A 03/20/2013   nerve damage  . RETINAL DETACHMENT SURGERY  2008  . THYROIDECTOMY  1979   partial  . TONSILLECTOMY AND ADENOIDECTOMY  as child  . TRIGGER FINGER RELEASE  1999  . TUBAL LIGATION  1976  . VAGINAL HYSTERECTOMY  1983     OB History   No obstetric history on file.      Home Medications    Prior to Admission medications   Medication  Sig Start Date End Date Taking? Authorizing Provider  brimonidine (ALPHAGAN) 0.2 % ophthalmic solution Place 1 drop into both eyes 3 (three) times daily.  04/09/15  Yes [provider]  Calcium Carb-Cholecalciferol (CALCIUM-VITAMIN D3) 600-400 MG-UNIT CAPS Take 2 capsules by mouth daily.   Yes [provider]  escitalopram (LEXAPRO) 10 MG tablet Take 1 tablet (10 mg total) by mouth daily. 05/09/18  Yes Cameron Sprang, MD  latanoprost (XALATAN) 0.005 % ophthalmic solution Place 1 drop into both eyes at bedtime.    Yes [provider]  loperamide (IMODIUM) 2 MG capsule Take 1 capsule (2 mg total) by mouth as needed for diarrhea or loose stools. 04/10/16  Yes Amin, Ankit Chirag, MD  loratadine (CLARITIN) 10 MG tablet Take 10 mg by mouth daily.   Yes [provider]  memantine (NAMENDA) 10 MG tablet Take 1 tablet (10 mg total) by mouth 2 (two) times daily. 05/09/18  Yes Cameron Sprang, MD  Multiple Vitamins-Iron (MULTIVITAMIN/IRON) TABS Take 1 tablet by mouth daily.     Yes [provider]  Probiotic Product (ALIGN) 4 MG CAPS Take 1 capsule by mouth daily.     Yes [provider]  telmisartan-hydrochlorothiazide (MICARDIS HCT) 40-12.5 MG tablet Take 1 tablet by mouth daily. 07/21/17  Yes Marin Olp, MD  atorvastatin (LIPITOR) 10 MG tablet Take 1 tablet (10 mg total) by mouth every other day. 02/22/18   Marin Olp, MD    Family History Family History  Problem Relation Age of Onset  . Diabetes Mother   . Heart disease Mother        CABG 6 in 49  . Heart attack Mother   . Stroke Mother   . Cancer Father 95       liver  . Colon cancer Father   . Colon cancer Maternal Grandmother   . Pancreatic cancer Maternal Grandmother   . Stomach cancer Maternal Grandmother   . Colon polyps Neg Hx   . Esophageal cancer Neg Hx   . Rectal cancer Neg Hx   . Breast cancer Neg Hx     Social History Social History   Tobacco Use  . Smoking  status: Never Smoker  . Smokeless tobacco: Never Used  Substance Use Topics  . Alcohol use: No    Alcohol/week: 0.0 standard drinks  . Drug use: No     Allergies   Exelon [rivastigmine tartrate]; Lisinopril; and Metformin and related   Review of Systems Review of Systems  All other systems reviewed and are negative.    Physical Exam Updated Vital Signs BP (!) 113/56   Pulse 63   Temp 97.9 F (36.6 C) (Oral)   Resp 19   SpO2 98%   Physical Exam Vitals signs and nursing note reviewed.  Constitutional:  General: She is not in acute distress.    Appearance: She is well-developed.  HENT:     Head: Normocephalic and atraumatic.  Neck:     Musculoskeletal: Normal range of motion.  Cardiovascular:     Rate and Rhythm: Normal rate and regular rhythm.     Heart sounds: Normal heart sounds.  Pulmonary:     Effort: Pulmonary effort is normal.     Breath sounds: Normal breath sounds.  Abdominal:     General: There is no distension.     Palpations: Abdomen is soft.     Tenderness: There is no abdominal tenderness.  Musculoskeletal: Normal range of motion.  Skin:    General: Skin is warm and dry.  Neurological:     Mental Status: She is alert and oriented to person, place, and time.  Psychiatric:        Judgment: Judgment normal.      ED Treatments / Results  Labs (all labs ordered are listed, but only abnormal results are displayed) Labs Reviewed  BASIC METABOLIC PANEL - Abnormal; Notable for the following components:      Result Value   Potassium 3.0 (*)    Glucose, Bld 183 (*)    BUN 25 (*)    Creatinine, Ser 1.16 (*)    Calcium 8.4 (*)    GFR calc non Af Amer 47 (*)    GFR calc Af Amer 54 (*)    All other components within normal limits  CBC - Abnormal; Notable for the following components:   RBC 3.59 (*)    Hemoglobin 10.9 (*)    HCT 33.1 (*)    All other components within normal limits  CBG MONITORING, ED - Abnormal; Notable for the following  components:   Glucose-Capillary 151 (*)    All other components within normal limits  TROPONIN I  D-DIMER, QUANTITATIVE (NOT AT [facility])  LACTIC ACID, PLASMA    EKG EKG Interpretation  Date/Time:  Wednesday May 11 2018 10:38:36 EST Ventricular Rate:  63 PR Interval:    QRS Duration: 95 QT Interval:  429 QTC Calculation: 443 R Axis:   11 Text Interpretation:  Sinus rhythm Short PR interval Low voltage, precordial leads Minimal ST elevation, lateral leads No significant change was found Interpretation limited secondary to artifact Confirmed by Jola Schmidt [phone]) on 05/11/2018 12:45:54 PM   Radiology No results found.  Procedures Procedures (including critical care time)  Medications Ordered in ED Medications  sodium chloride flush (NS) 0.9 % injection 3 mL (3 mLs Intravenous Given 05/11/18 1039)  sodium chloride 0.9 % bolus 2,000 mL (0 mLs Intravenous Stopped 05/11/18 1303)     Initial Impression / Assessment and Plan / ED Course  I have reviewed the triage vital signs and the nursing notes.  Pertinent labs & imaging results that were available during my care of the patient were reviewed by me and considered in my medical decision making (see chart for details).     Patient with significant work-up here in the emergency department.  D-dimer negative.  Labs without significant abnormality.  Patient given 2 L IV fluids.  She feels much better.  She is able to walk to the restroom.  It is unclear to me why she may have been volume depleted.  However she feels much better would like to go home at this time.  I think this is reasonable.  She will need to follow-up closely with her primary care physician.  I do not think  she needs additional work-up in the hospital at this time.  She does not need admission to the hospital.  No preceding chest pain or palpitations.  Doubt arrhythmia as a cause of her symptoms.  Suspect volume depletion.  No signs to suggest sepsis.  Chest x-ray clear  without signs of infection.  Patient and family encouraged to return to emergency department for new or worsening symptoms  Final Clinical Impressions(s) / ED Diagnoses   Final diagnoses:  Acute dehydration    ED Discharge Orders    None       Jola Schmidt, MD 05/14/18 1328

## 2018-05-16 ENCOUNTER — Encounter: Payer: Self-pay | Admitting: Family Medicine

## 2018-05-17 ENCOUNTER — Encounter: Payer: Self-pay | Admitting: Family Medicine

## 2018-05-17 ENCOUNTER — Ambulatory Visit (INDEPENDENT_AMBULATORY_CARE_PROVIDER_SITE_OTHER): Payer: Medicare Other | Admitting: Family Medicine

## 2018-05-17 VITALS — BP 130/60 | HR 64 | Temp 97.8°F | Ht 61.0 in | Wt 113.8 lb

## 2018-05-17 DIAGNOSIS — F03A0 Unspecified dementia, mild, without behavioral disturbance, psychotic disturbance, mood disturbance, and anxiety: Secondary | ICD-10-CM

## 2018-05-17 DIAGNOSIS — J069 Acute upper respiratory infection, unspecified: Secondary | ICD-10-CM

## 2018-05-17 DIAGNOSIS — R55 Syncope and collapse: Secondary | ICD-10-CM | POA: Diagnosis not present

## 2018-05-17 DIAGNOSIS — E119 Type 2 diabetes mellitus without complications: Secondary | ICD-10-CM | POA: Diagnosis not present

## 2018-05-17 DIAGNOSIS — F0390 Unspecified dementia without behavioral disturbance: Secondary | ICD-10-CM | POA: Diagnosis not present

## 2018-05-17 DIAGNOSIS — R42 Dizziness and giddiness: Secondary | ICD-10-CM

## 2018-05-17 DIAGNOSIS — I10 Essential (primary) hypertension: Secondary | ICD-10-CM | POA: Diagnosis not present

## 2018-05-17 DIAGNOSIS — Z9009 Acquired absence of other part of head and neck: Secondary | ICD-10-CM

## 2018-05-17 DIAGNOSIS — H401131 Primary open-angle glaucoma, bilateral, mild stage: Secondary | ICD-10-CM | POA: Diagnosis not present

## 2018-05-17 DIAGNOSIS — E890 Postprocedural hypothyroidism: Secondary | ICD-10-CM

## 2018-05-17 LAB — BASIC METABOLIC PANEL
BUN: 14 mg/dL (ref 6–23)
CALCIUM: 9.9 mg/dL (ref 8.4–10.5)
CO2: 34 meq/L — AB (ref 19–32)
CREATININE: 0.91 mg/dL (ref 0.40–1.20)
Chloride: 101 mEq/L (ref 96–112)
GFR: 60.52 mL/min (ref >60.00)
GLUCOSE: 98 mg/dL (ref 70–99)
Potassium: 3.9 mEq/L (ref 3.5–5.1)
Sodium: 143 mEq/L (ref 135–145)

## 2018-05-17 LAB — CBC WITH DIFFERENTIAL/PLATELET
Basophils Absolute: 0 10*3/uL (ref 0.0–0.1)
Basophils Relative: 0.3 % (ref 0.0–3.0)
EOS PCT: 0.9 % (ref 0.0–5.0)
Eosinophils Absolute: 0.1 10*3/uL (ref 0.0–0.7)
HEMATOCRIT: 33.6 % — AB (ref 36.0–46.0)
HEMOGLOBIN: 11.8 g/dL — AB (ref 12.0–15.0)
Lymphocytes Relative: 19.5 % (ref 12.0–46.0)
Lymphs Abs: 1.7 10*3/uL (ref 0.7–4.0)
MCHC: 35 g/dL (ref 30.0–36.0)
MCV: 88.6 fl (ref 78.0–100.0)
MONOS PCT: 5.8 % (ref 3.0–12.0)
Monocytes Absolute: 0.5 10*3/uL (ref 0.1–1.0)
Neutro Abs: 6.5 10*3/uL (ref 1.4–7.7)
Neutrophils Relative %: 73.5 % (ref 43.0–77.0)
Platelets: 314 10*3/uL (ref 150.0–400.0)
RBC: 3.79 Mil/uL — AB (ref 3.87–5.11)
RDW: 12.8 % (ref 11.5–15.5)
WBC: 8.9 10*3/uL (ref 4.0–10.5)

## 2018-05-17 LAB — TSH: TSH: 3.14 u[IU]/mL (ref 0.35–4.50)

## 2018-05-17 NOTE — Patient Instructions (Addendum)
Health Maintenance Due  Topic Date Due  . TETANUS/TDAP This will be done at the pharmacy 11/03/2017   Please stop by lab before you go If you do not have mychart- we will call you about results within 5 business days of [place] receiving them.  If you have mychart- we will send your results within 3 business days of [place] receiving them.  If abnormal or we want to clarify a result, we will call or mychart you to make sure you receive the message.  If you have questions or concerns or don't hear within 5-7 days, please send [place] a message or call us.   If you were to develop fever or chills or shortness of breath please see [place] back - I suspect the virus causing the cough will clear within 2 weeks - if not also see [place] back

## 2018-05-17 NOTE — Progress Notes (Signed)
Phone [phone]   Subjective:  [name] is a 73 y.o. year old very pleasant female patient who presents for/with See problem oriented charting ROS-no chest pain or shortness of breath.  No headache or blurry vision.  Did feel lightheaded and dizzy before presyncopal episode but no further episodes since that time.  No abdominal pain or nausea or vomiting reported  Past Medical History-  Patient Active Problem List   Diagnosis Date Noted  . Current mild episode of major depressive disorder without prior episode ([place]) 03/24/2017    Priority: High  . Mild dementia ([place]) 08/09/2015    Priority: High  . Fecal incontinence 03/23/2014    Priority: High  . Diabetes mellitus type II, controlled ([place]) 10/19/2006    Priority: High  . Mobitz type II atrioventricular block 04/21/2016    Priority: Medium  . History of adenomatous polyp of colon 08/09/2015    Priority: Medium  . H/O partial thyroidectomy 03/23/2014    Priority: Medium  . Hyperlipidemia 10/19/2006    Priority: Medium  . Essential hypertension 10/19/2006    Priority: Medium  . Glaucoma 03/23/2014    Priority: Low  . Family history of malignant neoplasm of gastrointestinal tract 08/26/2010    Priority: Low  . Anemia 08/26/2010    Priority: Low  . Esophageal stricture 07/16/2010    Priority: Low  . Osteopenia 04/22/2007    Priority: Low  . Syncope and collapse 04/09/2016  . Primary open angle glaucoma of both eyes, mild stage 12/21/2013  . Pseudophakia of both eyes 12/21/2013  . Type 2 diabetes mellitus without retinopathy ([place]) 12/21/2013    Medications- reviewed and updated Current Outpatient Medications  Medication Sig Dispense Refill  . atorvastatin (LIPITOR) 10 MG tablet Take 1 tablet (10 mg total) by mouth every other day. 46 tablet 3  . brimonidine (ALPHAGAN) 0.2 % ophthalmic solution Place 1 drop into both eyes 3 (three) times daily.   2  . Calcium Carb-Cholecalciferol (CALCIUM-VITAMIN D3)  600-400 MG-UNIT CAPS Take 2 capsules by mouth daily.    Marland Kitchen escitalopram (LEXAPRO) 10 MG tablet Take 1 tablet (10 mg total) by mouth daily. 90 tablet 3  . latanoprost (XALATAN) 0.005 % ophthalmic solution Place 1 drop into both eyes at bedtime.     Marland Kitchen loperamide (IMODIUM) 2 MG capsule Take 1 capsule (2 mg total) by mouth as needed for diarrhea or loose stools. 30 capsule 0  . loratadine (CLARITIN) 10 MG tablet Take 10 mg by mouth daily.    . memantine (NAMENDA) 10 MG tablet Take 1 tablet (10 mg total) by mouth 2 (two) times daily. 180 tablet 3  . Multiple Vitamins-Iron (MULTIVITAMIN/IRON) TABS Take 1 tablet by mouth daily.      . Probiotic Product (ALIGN) 4 MG CAPS Take 1 capsule by mouth daily.      Marland Kitchen telmisartan-hydrochlorothiazide (MICARDIS HCT) 40-12.5 MG tablet Take 1 tablet by mouth daily. 90 tablet 3   No current facility-administered medications for this visit.      Objective:  BP 130/60 (BP Location: Left Arm, Patient Position: Sitting, Cuff Size: Normal)   Pulse 64   Temp 97.8 F (36.6 C) (Oral)   Ht 5\' 1"  (1.549 m)   Wt 113 lb 12.8 oz (51.6 kg)   SpO2 96%   BMI 21.50 kg/m  Gen: NAD, resting comfortably Moist mucous membranes, mild pharyngeal erythema  CV: RRR no murmurs rubs or gallops Lungs: CTAB no crackles, wheeze, rhonchi Abdomen: soft/nontender/nondistended/normal bowel sounds.  Ext: no  edema Skin: warm, dry Neuro: Patient with good recall of course of events    Assessment and Plan     Postural dizziness with presyncope - Plan: CBC with Differential/Platelet, Basic metabolic panel, TSH S: Patient was seen in the emergency room on February 5-about a week ago.  Patient was coughing all night long before going to the emergency room and patient had planned to come in to see [place] here-while she was dressing to come to her appointment-she called out as she was feeling extremely lightheaded like she would pass out. He found her laying on the floor.  Patient denied chest pain  or palpitations before incident.  No back, neck or abdominal pain.  No shortness of breath reported.  No unilateral leg swelling.  Patient did feel lightheaded and she did lower hersel to the ground.  She claims she did not fall and did not lose consciousness.  She was orthostatic on vital signs when EMS arrived.  She had returned to her baseline mental status.  She does have mild dementia.  Patient had an extensive work-up in the emergency room including a negative d-dimer.  Chest x-ray was clear without infection.  Troponin was negative.  Lactic acid was not elevated.  There were no obvious signs to suggest sepsis.  She was given 2 L of fluids and felt much better.  The only issue was it was not clear why she would be volume depleted-she did not want to have ER work-up for this or observation.    It was not thought to be arrhythmia related-patient does have a history of Mobitz type II AV block- patient was seen for syncope in January 2018 but this was due to diarrhea from norovirus and not heart block per Dr. Irven Shelling evaluation and no further follow-up needed.  She had a carotid duplex at that time with only 1 to 39% stenosis of right and left internal carotid artery.  Patient does take telmisartan hydrochlorothiazide 40-12.5 mg at baseline for hypertension-does have slight diuretic in this- denies any orthostatic symptoms.  She does have diabetes but has been well controlled off medicine on last check   Husband reflects back that had recently had namenda and lexapro increased and had been busy with some appointments so may not have been eating/drinking as well. Husband as been encouraging her to drink regularly since that time.   Today she reports no recurrence of feeling like she felt. Cough has been improving- 9 doses of mucinex liquid over 4 days have been given.   -She denies orthostatic symptoms today  A/P: 73 year old female with history about a week ago of presyncopal episode related to  dehydration-I think the dehydration was multifactorial-suppressed appetite from new medications, recent URI, mild diuretic, poor liquid intake in general.  Husband has been encouraging her to drink fluids more regularly-she is doing a good job with this and has no further orthostatic symptoms. -Hypertension is well controlled-we opted not to reduce her dose of medication or stop diuretic at this time unless has recurrence -Mild dementia- she had a recent increase in Lexapro and Namenda-this may have suppressed appetite some but she is doing better now so we will continue current dose of medicine -We will get TSH given history of partial thyroidectomy -Diabetes has been well controlled without medications-no hypoglycemia was found in the emergency room  -Cardiology follow-up was discussed- we opted only to pursue this if recurrence of symptoms.  Has seen Dr. Einar Gip about 2 years ago but syncope at that  time was thought to be related to norovirus  In regards to URI-appears to be improving-return precautions discussed.  Can continue conservative management  In June or later likely moving to [place]-they want to keep May appointment Future Appointments  Date Time Provider [place]  08/23/2018  2:20 PM Marin Olp, MD [facility]  12/16/2018  3:00 PM Cameron Sprang, MD [facility] None   Lab/Order associations: Postural dizziness with presyncope - Plan: CBC with Differential/Platelet, Basic metabolic panel, TSH  Essential hypertension - Plan: CBC with Differential/Platelet, Basic metabolic panel, TSH  Controlled type 2 diabetes mellitus without complication, without long-term current use of insulin ([place])  H/O partial thyroidectomy  Mild dementia ([place])  Return precautions advised.  [name], MD

## 2018-06-05 ENCOUNTER — Emergency Department (HOSPITAL_COMMUNITY): Payer: Medicare Other

## 2018-06-05 ENCOUNTER — Other Ambulatory Visit: Payer: Self-pay

## 2018-06-05 ENCOUNTER — Encounter (HOSPITAL_COMMUNITY): Payer: Self-pay | Admitting: Emergency Medicine

## 2018-06-05 ENCOUNTER — Emergency Department (HOSPITAL_COMMUNITY)
Admission: EM | Admit: 2018-06-05 | Discharge: 2018-06-06 | Disposition: A | Discharge status: Home / Self Care | Payer: Medicare Other | Attending: Emergency Medicine | Admitting: Emergency Medicine

## 2018-06-05 DIAGNOSIS — I10 Essential (primary) hypertension: Secondary | ICD-10-CM | POA: Diagnosis not present

## 2018-06-05 DIAGNOSIS — E11319 Type 2 diabetes mellitus with unspecified diabetic retinopathy without macular edema: Secondary | ICD-10-CM | POA: Diagnosis not present

## 2018-06-05 DIAGNOSIS — R531 Weakness: Secondary | ICD-10-CM | POA: Diagnosis not present

## 2018-06-05 DIAGNOSIS — E860 Dehydration: Secondary | ICD-10-CM | POA: Insufficient documentation

## 2018-06-05 DIAGNOSIS — F0390 Unspecified dementia without behavioral disturbance: Secondary | ICD-10-CM | POA: Diagnosis not present

## 2018-06-05 DIAGNOSIS — R4182 Altered mental status, unspecified: Secondary | ICD-10-CM | POA: Diagnosis not present

## 2018-06-05 DIAGNOSIS — Z79899 Other long term (current) drug therapy: Secondary | ICD-10-CM | POA: Insufficient documentation

## 2018-06-05 DIAGNOSIS — R410 Disorientation, unspecified: Secondary | ICD-10-CM | POA: Diagnosis not present

## 2018-06-05 DIAGNOSIS — J9801 Acute bronchospasm: Secondary | ICD-10-CM | POA: Insufficient documentation

## 2018-06-05 DIAGNOSIS — R05 Cough: Secondary | ICD-10-CM | POA: Diagnosis not present

## 2018-06-05 LAB — MAGNESIUM: Magnesium: 1.9 mg/dL (ref 1.7–2.4)

## 2018-06-05 LAB — CBC WITH DIFFERENTIAL/PLATELET
Abs Immature Granulocytes: 0.11 10*3/uL — ABNORMAL HIGH (ref 0.00–0.07)
Basophils Absolute: 0 10*3/uL (ref 0.0–0.1)
Basophils Relative: 0 %
Eosinophils Absolute: 0 10*3/uL (ref 0.0–0.5)
Eosinophils Relative: 0 %
HEMATOCRIT: 34.7 % — AB (ref 36.0–46.0)
HEMOGLOBIN: 11.3 g/dL — AB (ref 12.0–15.0)
Immature Granulocytes: 1 %
Lymphocytes Relative: 9 %
Lymphs Abs: 1.2 10*3/uL (ref 0.7–4.0)
MCH: 30 pg (ref 26.0–34.0)
MCHC: 32.6 g/dL (ref 30.0–36.0)
MCV: 92 fL (ref 80.0–100.0)
Monocytes Absolute: 0.9 10*3/uL (ref 0.1–1.0)
Monocytes Relative: 7 %
Neutro Abs: 10.6 10*3/uL — ABNORMAL HIGH (ref 1.7–7.7)
Neutrophils Relative %: 83 %
Platelets: 239 10*3/uL (ref 150–400)
RBC: 3.77 MIL/uL — AB (ref 3.87–5.11)
RDW: 12.3 % (ref 11.5–15.5)
WBC: 12.8 10*3/uL — ABNORMAL HIGH (ref 4.0–10.5)
nRBC: 0 % (ref 0.0–0.2)

## 2018-06-05 LAB — COMPREHENSIVE METABOLIC PANEL
ALK PHOS: 76 U/L (ref 38–126)
ALT: 18 U/L (ref 0–44)
ANION GAP: 9 (ref 5–15)
AST: 18 U/L (ref 15–41)
Albumin: 4.6 g/dL (ref 3.5–5.0)
BUN: 29 mg/dL — ABNORMAL HIGH (ref 8–23)
CO2: 26 mmol/L (ref 22–32)
Calcium: 9.3 mg/dL (ref 8.9–10.3)
Chloride: 100 mmol/L (ref 98–111)
Creatinine, Ser: 1.2 mg/dL — ABNORMAL HIGH (ref 0.44–1.00)
GFR calc Af Amer: 52 mL/min — ABNORMAL LOW (ref >60)
GFR calc non Af Amer: 45 mL/min — ABNORMAL LOW (ref >60)
Glucose, Bld: 187 mg/dL — ABNORMAL HIGH (ref 70–99)
Potassium: 3.9 mmol/L (ref 3.5–5.1)
SODIUM: 135 mmol/L (ref 135–145)
TOTAL PROTEIN: 7.7 g/dL (ref 6.5–8.1)
Total Bilirubin: 0.4 mg/dL (ref 0.3–1.2)

## 2018-06-05 LAB — URINALYSIS, ROUTINE W REFLEX MICROSCOPIC
Bilirubin Urine: NEGATIVE
Glucose, UA: NEGATIVE mg/dL
Hgb urine dipstick: NEGATIVE
Ketones, ur: NEGATIVE mg/dL
Leukocytes,Ua: NEGATIVE
NITRITE: NEGATIVE
Protein, ur: NEGATIVE mg/dL
Specific Gravity, Urine: 1.014 (ref 1.005–1.030)
pH: 5 (ref 5.0–8.0)

## 2018-06-05 MED ORDER — ALBUTEROL SULFATE (2.5 MG/3ML) 0.083% IN NEBU
2.5000 mg | INHALATION_SOLUTION | Freq: Once | RESPIRATORY_TRACT | Status: AC
  Administered 2018-06-05: 2.5 mg via RESPIRATORY_TRACT
  Filled 2018-06-05: qty 3

## 2018-06-05 MED ORDER — SODIUM CHLORIDE 0.9 % IV BOLUS
500.0000 mL | Freq: Once | INTRAVENOUS | Status: AC
  Administered 2018-06-05: 500 mL via INTRAVENOUS

## 2018-06-05 NOTE — ED Notes (Signed)
Patient transported to CT 

## 2018-06-05 NOTE — ED Triage Notes (Signed)
Patient BIB family, patient hx dementia, reports worsening confusion x1 month. Reports confusion and weakness worse today. Denies N/V/D. Husband reports cough x6 weeks. States he has been giving patient mucinex. Seen x1 month ago for syncopal episode.

## 2018-06-05 NOTE — ED Provider Notes (Signed)
[facility] DEPT Provider Note   CSN: [id number] Arrival date & time: 06/05/18  2032    History   Chief Complaint Chief Complaint  Patient presents with  . Altered Mental Status  . Weakness    HPI [name] is a 73 y.o. female.     HPI Patient recently diagnosed with dementia and had both her Namenda and Lexapro doses increased of the last few weeks.  She is had increasing confusion.  Husband states he found her sitting on the floor this morning and she was unable to get up without help.  She had increasing difficulty ambulating with shuffling gait.  She is also had roughly 4 weeks of nonproductive cough.  Husband has been giving her Mucinex with no improvement.  No vomiting or diarrhea.  No known trauma. Past Medical History:  Diagnosis Date  . Allergy   . Arthritis    right arm  . Cataract    bilateral and removal  . Environmental allergies   . Fecal incontinence    sphincter stim placed 07-2014  . GERD (gastroesophageal reflux disease)   . Glaucoma, both eyes   . History of adenomatous polyp of colon   . History of diverticulitis    duodenal 2012-- resolved  . History of hiatal hernia   . Hyperlipidemia   . Hypertension   . Osteopenia   . Sigmoid diverticulosis   . Status post dilation of esophageal narrowing   . Thyroid disease   . Type 2 diabetes mellitus ([place])   . Wears glasses     Patient Active Problem List   Diagnosis Date Noted  . Current mild episode of major depressive disorder without prior episode ([place]) 03/24/2017  . Mobitz type II atrioventricular block 04/21/2016  . Syncope and collapse 04/09/2016  . History of adenomatous polyp of colon 08/09/2015  . Mild dementia ([place]) 08/09/2015  . Glaucoma 03/23/2014  . H/O partial thyroidectomy 03/23/2014  . Fecal incontinence 03/23/2014  . Primary open angle glaucoma of both eyes, mild stage 12/21/2013  . Pseudophakia of both eyes 12/21/2013  . Type 2 diabetes  mellitus without retinopathy ([place]) 12/21/2013  . Family history of malignant neoplasm of gastrointestinal tract 08/26/2010  . Anemia 08/26/2010  . Esophageal stricture 07/16/2010  . Osteopenia 04/22/2007  . Hyperlipidemia 10/19/2006  . Essential hypertension 10/19/2006  . Diabetes mellitus type II, controlled ([place]) 10/19/2006    Past Surgical History:  Procedure Laterality Date  . ANAL RECTAL MANOMETRY N/A 03/20/2013  . BUNIONECTOMY Bilateral 1985  . CARDIOVASCULAR STRESS TEST  02-13-2005   normal perfusion study/  no ischemia/  normal LV function and wall motion , ef 86%  . CARPAL TUNNEL RELEASE Left 08-05-2006  . CATARACT EXTRACTION    . COLONOSCOPY    . COLONOSCOPY W/ POLYPECTOMY  last one 02-13-2010  . ESOPHAGOGASTRODUODENOSCOPY (EGD) WITH ESOPHAGEAL DILATION  last one 09-11-2010  . KNEE ARTHROSCOPY Right 2013  . LAPAROSCOPIC NISSEN FUNDOPLICATION  2297  . peripheral neuro stimulator  07-2014   sphincter stimulator  . POLYPECTOMY    . RECTAL ULTRASOUND N/A 03/20/2013   nerve damage  . RETINAL DETACHMENT SURGERY  2008  . THYROIDECTOMY  1979   partial  . TONSILLECTOMY AND ADENOIDECTOMY  as child  . TRIGGER FINGER RELEASE  1999  . TUBAL LIGATION  1976  . VAGINAL HYSTERECTOMY  1983     OB History   No obstetric history on file.      Home Medications  Prior to Admission medications   Medication Sig Start Date End Date Taking? Authorizing Provider  atorvastatin (LIPITOR) 10 MG tablet Take 1 tablet (10 mg total) by mouth every other day. 02/22/18  Yes Marin Olp, MD  brimonidine (ALPHAGAN) 0.2 % ophthalmic solution Place 1 drop into both eyes 3 (three) times daily.  04/09/15  Yes [provider]  Calcium Carb-Cholecalciferol (CALCIUM-VITAMIN D3) 600-400 MG-UNIT CAPS Take 2 capsules by mouth daily.   Yes [provider]  escitalopram (LEXAPRO) 10 MG tablet Take 1 tablet (10 mg total) by mouth daily. 05/09/18  Yes Cameron Sprang, MD  guaiFENesin  (MUCINEX) 600 MG 12 hr tablet Take 600 mg by mouth 2 (two) times daily as needed for cough or to loosen phlegm.   Yes [provider]  latanoprost (XALATAN) 0.005 % ophthalmic solution Place 1 drop into both eyes at bedtime.    Yes [provider]  loperamide (IMODIUM) 2 MG capsule Take 1 capsule (2 mg total) by mouth as needed for diarrhea or loose stools. 04/10/16  Yes Amin, Ankit Chirag, MD  memantine (NAMENDA) 10 MG tablet Take 1 tablet (10 mg total) by mouth 2 (two) times daily. 05/09/18  Yes Cameron Sprang, MD  Multiple Vitamins-Iron (MULTIVITAMIN/IRON) TABS Take 1 tablet by mouth daily.     Yes [provider]  Probiotic Product (ALIGN) 4 MG CAPS Take 1 capsule by mouth daily.     Yes [provider]  telmisartan-hydrochlorothiazide (MICARDIS HCT) 40-12.5 MG tablet Take 1 tablet by mouth daily. 07/21/17  Yes Marin Olp, MD    Family History Family History  Problem Relation Age of Onset  . Diabetes Mother   . Heart disease Mother        CABG 66 in 51  . Heart attack Mother   . Stroke Mother   . Cancer Father 31       liver  . Colon cancer Father   . Colon cancer Maternal Grandmother   . Pancreatic cancer Maternal Grandmother   . Stomach cancer Maternal Grandmother   . Colon polyps Neg Hx   . Esophageal cancer Neg Hx   . Rectal cancer Neg Hx   . Breast cancer Neg Hx     Social History Social History   Tobacco Use  . Smoking status: Never Smoker  . Smokeless tobacco: Never Used  Substance Use Topics  . Alcohol use: No    Alcohol/week: 0.0 standard drinks  . Drug use: No     Allergies   Exelon [rivastigmine tartrate]; Lisinopril; and Metformin and related   Review of Systems Review of Systems  Unable to perform ROS: Dementia     Physical Exam Updated Vital Signs BP 96/71   Pulse 94   Temp 98.4 F (36.9 C) (Oral)   Resp 20   SpO2 95%   Physical Exam Vitals signs and nursing note reviewed.  Constitutional:       General: She is not in acute distress.    Appearance: Normal appearance. She is well-developed. She is not ill-appearing.     Comments: No obvious scalp trauma.  Cranial nerves II through XII grossly intact.  HENT:     Head: Normocephalic and atraumatic.     Nose: Nose normal.     Mouth/Throat:     Mouth: Mucous membranes are moist.     Pharynx: No oropharyngeal exudate or posterior oropharyngeal erythema.  Eyes:     Extraocular Movements: Extraocular movements intact.  Pupils: Pupils are equal, round, and reactive to light.  Neck:     Musculoskeletal: Normal range of motion and neck supple. No neck rigidity or muscular tenderness.     Comments: No posterior midline cervical tenderness to palpation. Cardiovascular:     Rate and Rhythm: Normal rate and regular rhythm.     Heart sounds: No murmur. No friction rub. No gallop.   Pulmonary:     Effort: Pulmonary effort is normal.     Breath sounds: Wheezing and rales present.     Comments: Scattered expiratory wheezes right greater than left and rales.  No respiratory distress. Abdominal:     General: Bowel sounds are normal. There is no distension.     Palpations: Abdomen is soft.     Tenderness: There is no abdominal tenderness. There is no right CVA tenderness, left CVA tenderness, guarding or rebound.     Hernia: No hernia is present.  Musculoskeletal: Normal range of motion.        General: No swelling, tenderness, deformity or signs of injury.     Right lower leg: No edema.     Left lower leg: No edema.  Lymphadenopathy:     Cervical: No cervical adenopathy.  Skin:    General: Skin is warm and dry.     Capillary Refill: Capillary refill takes less than 2 seconds.     Findings: No erythema or rash.  Neurological:     General: No focal deficit present.     Mental Status: She is alert.     Comments: Oriented to self.  Follows commands.  5/5 motor in all extremities.  Sensation intact.  Psychiatric:        Mood and Affect:  Mood normal.        Behavior: Behavior normal.      ED Treatments / Results  Labs (all labs ordered are listed, but only abnormal results are displayed) Labs Reviewed  CBC WITH DIFFERENTIAL/PLATELET - Abnormal; Notable for the following components:      Result Value   WBC 12.8 (*)    RBC 3.77 (*)    Hemoglobin 11.3 (*)    HCT 34.7 (*)    Neutro Abs 10.6 (*)    Abs Immature Granulocytes 0.11 (*)    All other components within normal limits  COMPREHENSIVE METABOLIC PANEL - Abnormal; Notable for the following components:   Glucose, Bld 187 (*)    BUN 29 (*)    Creatinine, Ser 1.20 (*)    GFR calc non Af Amer 45 (*)    GFR calc Af Amer 52 (*)    All other components within normal limits  MAGNESIUM  URINALYSIS, ROUTINE W REFLEX MICROSCOPIC    EKG EKG Interpretation  Date/Time:  Sunday June 05 2018 21:52:07 EST Ventricular Rate:  89 PR Interval:    QRS Duration: 86 QT Interval:  353 QTC Calculation: 430 R Axis:   -4 Text Interpretation:  Sinus rhythm Low voltage, precordial leads Consider anterior infarct Baseline wander in lead(s) V6 Confirmed by Orpah Greek [phone]) on 06/06/2018 12:57:20 AM Also confirmed by Orpah Greek [phone]), editor Philomena Doheny [phone])  on 06/06/2018 7:47:33 AM   Radiology Dg Chest 2 View  Result Date: 06/05/2018 CLINICAL DATA:  Cough EXAM: CHEST - 2 VIEW COMPARISON:  05/11/2018, 04/09/2016 FINDINGS: The heart size and mediastinal contours are within normal limits. Both lungs are clear. The visualized skeletal structures are unremarkable. IMPRESSION: No active cardiopulmonary disease. Electronically Signed   By:  Donavan Foil M.D.   On: 06/05/2018 22:19   Ct Head Wo Contrast  Result Date: 06/05/2018 CLINICAL DATA:  Confusion and dementia.  Altered consciousness. EXAM: CT HEAD WITHOUT CONTRAST TECHNIQUE: Contiguous axial images were obtained from the base of the skull through the vertex without intravenous contrast. COMPARISON:   April 09, 2016 FINDINGS: Brain: No subdural, epidural, or subarachnoid hemorrhage. Ventricles and sulci are prominent. The ventricular prominence is somewhat out of proportion to the sulcal prominence. Cerebellum, brainstem, and basal cisterns are normal. Chronic white matter changes. No acute cortical ischemia or infarct. No mass effect or midline shift. Vascular: No hyperdense vessel or unexpected calcification. Skull: Normal. Negative for fracture or focal lesion. Sinuses/Orbits: No acute finding. Other: None. IMPRESSION: 1. No acute intracranial abnormalities. Chronic white matter changes. 2. Ventricular and sulcal prominence. The ventricular prominence is somewhat out of proportion to the sulcal prominence. This is a stable finding and is nonspecific. However, normal pressure hydrocephalus could result in these findings in the appropriate clinical setting. Electronically Signed   By: Dorise Bullion III M.D   On: 06/05/2018 22:57    Procedures Procedures (including critical care time)  Medications Ordered in ED Medications  sodium chloride 0.9 % bolus 500 mL (0 mLs Intravenous Stopped 06/06/18 0128)  albuterol (PROVENTIL) (2.5 MG/3ML) 0.083% nebulizer solution 2.5 mg (2.5 mg Nebulization Given 06/05/18 2340)     Initial Impression / Assessment and Plan / ED Course  I have reviewed the triage vital signs and the nursing notes.  Pertinent labs & imaging results that were available during my care of the patient were reviewed by me and considered in my medical decision making (see chart for details).       Patient with mild elevation and white blood cell count and creatinine.  CT with prominent ventricles.  Question normal pressure hydrocephalus.  Patient had some wheezing on exam.  Chest x-ray without acute findings.  Discussed with hospitalist.  She is closely followed by neurology.  Will give IV fluids and check UA.  Signed out to oncoming emergency physician pending these results.   Final  Clinical Impressions(s) / ED Diagnoses   Final diagnoses:  Dehydration  Weakness  Bronchospasm    ED Discharge Orders    None       Julianne Rice, MD 06/06/18 2256

## 2018-06-06 DIAGNOSIS — E860 Dehydration: Secondary | ICD-10-CM | POA: Diagnosis not present

## 2018-06-06 MED ORDER — ALBUTEROL SULFATE HFA 108 (90 BASE) MCG/ACT IN AERS
2.0000 | INHALATION_SPRAY | RESPIRATORY_TRACT | Status: DC | PRN
  Administered 2018-06-06: 2 via RESPIRATORY_TRACT
  Filled 2018-06-06: qty 6.7

## 2018-06-06 NOTE — ED Provider Notes (Signed)
Patient signed out to me by Dr. Lita Mains to follow-up on urine.  Patient was seen with cough, chest congestion, occasional wheezing for 6 weeks.  She has had increased confusion.  She does have a history of dementia.  She is followed by neurology and primary care.  I have reviewed the work-up.  CT head was stable from prior, does have increased sulci and ventricles, but unchanged from prior.  Likely related to her dementia.  Chest x-ray does not show pneumonia.  She did receive breathing treatment earlier and her lungs are now clear on my exam.  Lab work revealed very slight leukocytosis, nonspecific.  No infectious symptoms other than the cough and chest x-ray was clear.  Urinalysis did not show any infection.  She does have some evidence of dehydration, BUN and creatinine are both elevated compared to labs from 3 weeks ago.  She has received IV fluids here in the ER and will increase her oral intake.  Will discharge with albuterol, follow-up with primary care and neurology.  Patient and family are comfortable with this plan.   Orpah Greek, MD 06/06/18 [phone]

## 2018-06-06 NOTE — ED Notes (Signed)
Patient given discharge teaching and verbalized understanding. Patient was taken out of ED with wheelchair by ED staff.

## 2018-06-09 ENCOUNTER — Telehealth: Payer: Self-pay

## 2018-06-09 NOTE — Telephone Encounter (Signed)
Spoke with pt's husband, Laverna Peace.  He states that he and Dr. Delice Lesch have been communicating via [place] but he was unable to sign in this AM to read response.  I relayed response from Dr. Delice Lesch (cut back Namenda x1wk, call with update, may adjust Lexapro at that time - pls see MyChart exchange dated 03.04.2020)  Jimmy verbalized understanding.

## 2018-08-11 ENCOUNTER — Telehealth (INDEPENDENT_AMBULATORY_CARE_PROVIDER_SITE_OTHER): Payer: Medicare Other | Admitting: Neurology

## 2018-08-11 ENCOUNTER — Other Ambulatory Visit: Payer: Self-pay

## 2018-08-11 DIAGNOSIS — F0391 Unspecified dementia with behavioral disturbance: Secondary | ICD-10-CM

## 2018-08-11 DIAGNOSIS — F03B18 Unspecified dementia, moderate, with other behavioral disturbance: Secondary | ICD-10-CM

## 2018-08-11 DIAGNOSIS — R269 Unspecified abnormalities of gait and mobility: Secondary | ICD-10-CM

## 2018-08-11 MED ORDER — ESCITALOPRAM OXALATE 10 MG PO TABS: 10.0000 mg | ORAL_TABLET | Freq: Every day | ORAL | 3 refills | Status: AC

## 2018-08-11 MED ORDER — MEMANTINE HCL 10 MG PO TABS: ORAL_TABLET | ORAL | 3 refills | Status: AC

## 2018-08-11 NOTE — Progress Notes (Signed)
Virtual Visit via Video Note The purpose of this virtual visit is to provide medical care while limiting exposure to the novel coronavirus.    Consent was obtained for video visit:  Yes.   Answered questions that patient had about telehealth interaction:  Yes.   I discussed the limitations, risks, security and privacy concerns of performing an evaluation and management service by telemedicine. I also discussed with the patient that there may be a patient responsible charge related to this service. The patient expressed understanding and agreed to proceed.  Pt location: Home Physician Location: office Name of referring provider:  Marin Olp, MD I connected with Christin Fudge at patients initiation/request on 08/11/2018 at 11:00 AM EDT by video enabled telemedicine application and verified that I am speaking with the correct person using two identifiers. Pt MRN:  [id number] Pt DOB:  1946-02-05 Video Participants:  Christin Fudge;  Georgina Pillion (husband), Breck Coons (daughter)   History of Present Illness:  The patient was last seen 3 months ago for moderate dementia with behavioral disturbance. Her husband and daughter are present during this visit, her husband requested for an earlier visit reporting she is digressing quite rapidly. He listed several concerns, including not wanting to get up, sleeping for prolonged periods of time, cold/shaking, hypotension, left neck and shoulder pain(Tylenol helps), increased weakness, not wanting to eat or drink much, dry mouth/throat with trouble swallowing, asking about Zyrtec. These were addressed with patient and family today.  Cherise states she has up and down days, "not as smooth as it used to be, but I'm okay." Family reports she sleeps well at night but is also drowsy during the day. He has to make her get up and eat breakfast, then she wants to go back to bed. She would be freezing cold when she gets up, then sleeps until  afternoon. Family has noticed she can be very confused and really lost when she is startled awake. She says she has taken a bath but the towels would be try. She has made unusual comments such as "what would people think about [place] sleeping together" to her husband or wanting to go home when she is at home. Her husband feels she has trouble swallowing her calcium pill because of extremely dry mouth. Zyrtec has helped a lot with allergies and runny nose, but he wonders if it dries her out so much. He also wonders about her BP, sometimes down to 92/50 where she would be pale. She usually runs in the 120s/80s. Family reports she drinks about 3 glasses of water a day. She has been complaining of left-sided neck pain which kept her up last night, no falls. Family continues to report "little bitty short steps." She takes Lexapro and Memantine at bedtime (did not tolerate BID Memantine). She takes statin every other day. Lexapro does help with mood, they noticed an almost immediate improvement with increase in dose. No tremor.   HPI 03/19/2016: This is a pleasant 73 yo RH woman with a history of hypertension, hyperlipidemia, diabetes, fecal incontinence, who presented for evaluation of report of abnormal brain MRI ordered for memory loss. She feels her memory is not as good as it was, she has noticed a gradual decline with memory for names, dates, and conversations. 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 Her motor functions are not where they used to be. She  now questions her ability to know how to get places she has been to multiple times in the past. She left groceries in the back of her car. One time she double paid their church in one month. She denies any missed medications. Her daughter has noticed she would repeat the same conversation. She has more difficulty organizing a meal and does not cook very much anymore. They  deny any gait changes, but report she has fallen a couple of times due to missteps. She denies any urinary incontinence. She has a chronic history of fecal incontinence since she delivered her children, and had a [employer] device stimulating her sphincter muscle. This has helped, but she continues to have diarrhea at least once a week. Her husband reports a weight loss of 25 lbs in the past year. Her appetite is not as good as it was. Sleep is good. She denies any headaches, vision changes, dysarthria/dysphagia, neck/back pain, focal numbness/tingling/weakness. No anosmia. She has mild tremors when anxious. They report an incident of loss of consciousness a week after her MRI, she walked into the bathroom then next thing she knew her head was on the cabinet. She was not gone for a long time per family, probably 5 minutes, but she was unable to tell them what had happened on how she hit her forehead. She may have been dizzy, denied any confusion or headache. No other episodes of loss of consciousness in the past. There is no family history of dementia. She denies any alcohol intake, no other head injuries. She was started on Aricept which she is tolerating without side effects.  I personally reviewed MRI brain with and without contrast done 01/19/16 which did not show any acute changes. There was mild chronic microvascular disease and mild volume loss. There was note of enlargement of the lateral and third ventricles which is borderline disproportional to the degree of parenchymal volume loss and increased from 2007. No transependymal flow seen, patent cerebral aqueduct    Observations/Objective:   GEN:  The patient appears stated age and is in NAD. Neurological examination: Patient is awake, alert, oriented to person. She had difficulty doing memory testing on video, unable to do serial 7s or repeat. 0/5 delayed memory, only able to give 1 F word in 1 minute. [place] 5/22. Cranial nerves: Extraocular movements  intact with no nystagmus. No facial asymmetry. Motor: moves all extremities symmetrically, at least anti-gravity x 4. No incoordination on finger to nose testing. Gait: gait changes noted compared to prior visit, she takes small steps with limited arm swing. No tremor on video.   Assessment and Plan:   This is a pleasant 73 yo RH woman with a history of hypertension, hyperlipidemia, diabetes, fecal incontinence, with worsening memory. MRI brain in 2017 showed diffuse atrophy and borderline disproportional enlargement of the ventricles to the degree of parenchymal volume loss. There was question about normal pressure hydrocephalus, however on prior visits, she did not have other features of NPH except for memory changes. Gait today is different compared to prior visits, would repeat MRI brain with and without contrast to assess for interval change. Memory continues to worsen, however main concern from family today are daytime drowsiness and dry mouth. Medications were reviewed, they will try giving Lexapro in the morning, continue Memantine at bedtime. Also advised increasing stimulation during the day. Advised increasing fluid intake, trial of holding Zyrtec if this improves dry mouth, as well as discussing diuretic with PCP if this is contributing. She will follow-up  in 6 months if still in the area, family knows to call for any changes.   Follow Up Instructions:   -I discussed the assessment and treatment plan with the patient and family. The patient was provided an opportunity to ask questions and all were answered. The patient and family agreed with the plan and demonstrated an understanding of the instructions.   The patient was advised to call back or seek an in-person evaluation if the symptoms worsen or if the condition fails to improve as anticipated.   Cameron Sprang, MD

## 2018-08-12 ENCOUNTER — Encounter: Payer: Self-pay | Admitting: Family Medicine

## 2018-08-15 ENCOUNTER — Encounter: Payer: Self-pay | Admitting: Family Medicine

## 2018-08-15 ENCOUNTER — Telehealth (INDEPENDENT_AMBULATORY_CARE_PROVIDER_SITE_OTHER): Payer: Medicare Other | Admitting: Family Medicine

## 2018-08-15 DIAGNOSIS — E785 Hyperlipidemia, unspecified: Secondary | ICD-10-CM | POA: Diagnosis not present

## 2018-08-15 DIAGNOSIS — J309 Allergic rhinitis, unspecified: Secondary | ICD-10-CM | POA: Insufficient documentation

## 2018-08-15 DIAGNOSIS — J301 Allergic rhinitis due to pollen: Secondary | ICD-10-CM

## 2018-08-15 DIAGNOSIS — I10 Essential (primary) hypertension: Secondary | ICD-10-CM | POA: Diagnosis not present

## 2018-08-15 NOTE — Patient Instructions (Addendum)
Health Maintenance Due  Topic Date Due  . TETANUS/TDAP Please stop by local pharmacy when covid-19 calms 11/03/2017    Depression screen [facility] 2/9 02/22/2018 09/30/2017 10/16/2016  Decreased Interest 0 0 0  Down, Depressed, Hopeless 0 0 0  PHQ - 2 Score 0 0 0  Altered sleeping 0 - -  Tired, decreased energy 0 - -  Change in appetite 0 - -  Feeling bad or failure about yourself  0 - -  Trouble concentrating 0 - -  Moving slowly or fidgety/restless 0 - -  Suicidal thoughts 0 - -  PHQ-9 Score 0 - -  Difficult doing work/chores Not difficult at all - -   Video visit

## 2018-08-15 NOTE — Progress Notes (Signed)
Phone [phone]   Subjective:  Virtual visit via Video note. Chief complaint: Chief Complaint  Patient presents with  . Dehydration    Pt husband stated tht the pt meds were changed. Pt is having dry mouth pale complection 2 visit in the past 2 months due to dehydration. pt bp ihas dropped to 95-60. Possible issue with Losartan.    This visit type was conducted due to national recommendations for restrictions regarding the COVID-19 Pandemic (e.g. social distancing).  This format is felt to be most appropriate for this patient at this time balancing risks to patient and risks to population by having him in for in person visit.  No physical exam was performed (except for noted visual exam or audio findings with Telehealth visits).    Our team/I connected with Christin Fudge on 08/15/18 at  8:20 AM EDT by a video enabled telemedicine application ([email or web address]) and verified that I am speaking with the correct person using two identifiers.  Location patient: [place] Location provider: [facility], office Persons participating in the virtual visit:  patient, husband who provides most of the history  Our team/I discussed the limitations of evaluation and management by telemedicine and the availability of in person appointments. In light of current covid-19 pandemic, patient also understands that we are trying to protect them by minimizing in office contact if at all possible.  The patient expressed consent for telemedicine visit and agreed to proceed. Patient understands insurance will be billed.   ROS- No chest pain or shortness of breath. No headache or blurry vision.  No fever/chills.  Worsening memory- Namenda has been increased  Past Medical History-  Patient Active Problem List   Diagnosis Date Noted  . Current mild episode of major depressive disorder without prior episode ([place]) 03/24/2017    Priority: High  . Mild dementia ([place]) 08/09/2015    Priority: High  . Fecal incontinence  03/23/2014    Priority: High  . Diabetes mellitus type II, controlled ([place]) 10/19/2006    Priority: High  . Mobitz type II atrioventricular block 04/21/2016    Priority: Medium  . History of adenomatous polyp of colon 08/09/2015    Priority: Medium  . H/O partial thyroidectomy 03/23/2014    Priority: Medium  . Hyperlipidemia 10/19/2006    Priority: Medium  . Essential hypertension 10/19/2006    Priority: Medium  . Allergic rhinitis 08/15/2018    Priority: Low  . Glaucoma 03/23/2014    Priority: Low  . Family history of malignant neoplasm of gastrointestinal tract 08/26/2010    Priority: Low  . Anemia 08/26/2010    Priority: Low  . Esophageal stricture 07/16/2010    Priority: Low  . Osteopenia 04/22/2007    Priority: Low  . Syncope and collapse 04/09/2016  . Primary open angle glaucoma of both eyes, mild stage 12/21/2013  . Pseudophakia of both eyes 12/21/2013    Medications- reviewed and updated Current Outpatient Medications  Medication Sig Dispense Refill  . atorvastatin (LIPITOR) 10 MG tablet Take 1 tablet (10 mg total) by mouth every other day. 46 tablet 3  . brimonidine (ALPHAGAN) 0.2 % ophthalmic solution Place 1 drop into both eyes 3 (three) times daily.   2  . Calcium Carb-Cholecalciferol (CALCIUM-VITAMIN D3) 600-400 MG-UNIT CAPS Take 2 capsules by mouth daily.    Marland Kitchen escitalopram (LEXAPRO) 10 MG tablet Take 1 tablet (10 mg total) by mouth daily. 90 tablet 3  . latanoprost (XALATAN) 0.005 % ophthalmic solution Place 1 drop into both eyes at  bedtime.     Marland Kitchen loperamide (IMODIUM) 2 MG capsule Take 1 capsule (2 mg total) by mouth as needed for diarrhea or loose stools. 30 capsule 0  . memantine (NAMENDA) 10 MG tablet Take 1 tablet at night 90 tablet 3  . Multiple Vitamins-Iron (MULTIVITAMIN/IRON) TABS Take 1 tablet by mouth daily.      . Probiotic Product (ALIGN) 4 MG CAPS Take 1 capsule by mouth daily.      Marland Kitchen telmisartan-hydrochlorothiazide (MICARDIS HCT) 40-12.5 MG  tablet Take 1 tablet by mouth daily. 90 tablet 3  . guaiFENesin (MUCINEX) 600 MG 12 hr tablet Take 600 mg by mouth 2 (two) times daily as needed for cough or to loosen phlegm.     No current facility-administered medications for this visit.      Objective:  BP 128/82   Pulse 75   Ht 5' 1.5" (1.562 m)   Wt 110 lb (49.9 kg)   BMI 20.45 kg/m  self reported vitals Gen: NAD, resting comfortably- leanign up against the bed Lungs: nonlabored, normal respiratory rate  Skin: appears dry, no obvious rash Neuro: Husband provides much of the history but she does contribute some     Assessment and Plan   #hypertension S: controlled on telmisartan hydrochlorothiazide 40-12.5 mg.  Likely overcontrolled- patient will intermittently have blood pressures down to the 02H systolic.When BP gts into 90s she seems to get whitish and seems really tired- BP gets back over 100 when they push fluids.  Peak blood pressure is as below 128/82.  Has been concerned about dehydration as patient also dealing with dry mouth BP Readings from Last 3 Encounters:  08/15/18 128/82  08/10/18 (!) 97/52  06/06/18 96/71  A/P:  overcontrolled-we will decrease to half tablet of telmisartan hydrochlorothiazide 40-12.5 mg.  If symptoms resolve and blood pressure remains less than 852 systolic on average (higher goal due to intolerance of lower blood pressures)- they will simply update me by my chart in 7 days.  If any symptoms persist her blood pressure gets above goal-recommended another video visit in a week  #Allergic rhinitis S:holding zyrtec for now- since Saturday- no major flare in allergies.  Holding due to possibly contributing to dry mouth A/P: Fortunately no allergy flare yet off of Zyrtec-may consider alternates like nasal saline rinses.  Could consider Flonase but does have glaucoma  #hyperlipidemia S:  controlled on atorvastatin 10 mg last visit- reduce to every other day as patient wanted to reduce overall  medicine burden if possible Lab Results  Component Value Date   CHOL 136 09/30/2017   HDL 65.70 09/30/2017   LDLCALC 55 09/30/2017   LDLDIRECT 41.0 06/23/2016   TRIG 80.0 09/30/2017   CHOLHDL 2 09/30/2017   A/P: Patient comes in the office-would like to get updated lipid panel-this may be with new PCP in [place] as she is in the process of moving-we will really miss her and her husband   Future Appointments  Date Time Provider [place]  08/23/2018  2:20 PM Marin Olp, MD [facility]  12/16/2018  3:00 PM Cameron Sprang, MD [facility] None   Lab/Order associations: Essential hypertension  Allergic rhinitis due to pollen, unspecified seasonality  Hyperlipidemia, unspecified hyperlipidemia type  Return precautions advised.  [name], MD

## 2018-08-15 NOTE — Assessment & Plan Note (Signed)
S:  controlled on atorvastatin 10 mg last visit- reduce to every other day as patient wanted to reduce overall medicine burden if possible Lab Results  Component Value Date   CHOL 136 09/30/2017   HDL 65.70 09/30/2017   LDLCALC 55 09/30/2017   LDLDIRECT 41.0 06/23/2016   TRIG 80.0 09/30/2017   CHOLHDL 2 09/30/2017   A/P: Patient comes in the office-would like to get updated lipid panel-this may be with new PCP in [place] as she is in the process of moving-we will really miss her and her husband

## 2018-08-15 NOTE — Assessment & Plan Note (Signed)
S: controlled on telmisartan hydrochlorothiazide 40-12.5 mg.  Likely overcontrolled- patient will intermittently have blood pressures down to the 31G systolic.When BP gts into 90s she seems to get whitish and seems really tired- BP gets back over 100 when they push fluids.  Peak blood pressure is as below 128/82 BP Readings from Last 3 Encounters:  08/15/18 128/82  08/10/18 (!) 97/52  06/06/18 96/71  A/P:  overcontrolled-we will decrease to half tablet of telmisartan hydrochlorothiazide 40-12.5 mg.  If symptoms resolve and blood pressure remains less than 508 systolic on average (higher goal due to intolerance of lower blood pressures)- they will simply update me by my chart in 7 days.  If any symptoms persist her blood pressure gets above goal-recommended another video visit in a week

## 2018-08-15 NOTE — Assessment & Plan Note (Signed)
S:holding zyrtec for now- since Saturday- no major flare in allergies.  Holding due to possibly contributing to dry mouth A/P: Fortunately no allergy flare yet off of Zyrtec-may consider alternates like nasal saline rinses.  Could consider Flonase but does have glaucoma

## 2018-08-17 ENCOUNTER — Other Ambulatory Visit: Payer: Self-pay

## 2018-08-17 DIAGNOSIS — R269 Unspecified abnormalities of gait and mobility: Secondary | ICD-10-CM

## 2018-08-17 NOTE — Progress Notes (Signed)
MRI ordered

## 2018-08-19 ENCOUNTER — Encounter: Payer: Self-pay | Admitting: Family Medicine

## 2018-08-23 ENCOUNTER — Ambulatory Visit: Payer: Medicare Other | Admitting: Family Medicine

## 2018-08-24 ENCOUNTER — Other Ambulatory Visit: Payer: Self-pay

## 2018-08-24 ENCOUNTER — Telehealth: Payer: Self-pay

## 2018-08-24 DIAGNOSIS — R269 Unspecified abnormalities of gait and mobility: Secondary | ICD-10-CM

## 2018-08-24 NOTE — Telephone Encounter (Signed)
Spoke to pt husband informed him that MRI was rescheduled to be done at [facility], pt husband agreed to that, also apologized for [facility] being rude to them, pt husband informed that someone from [facility] will call them to set up an appointment time

## 2018-09-12 DIAGNOSIS — H401131 Primary open-angle glaucoma, bilateral, mild stage: Secondary | ICD-10-CM | POA: Diagnosis not present

## 2018-09-30 DIAGNOSIS — R269 Unspecified abnormalities of gait and mobility: Secondary | ICD-10-CM

## 2018-09-30 DIAGNOSIS — F03B18 Unspecified dementia, moderate, with other behavioral disturbance: Secondary | ICD-10-CM

## 2018-09-30 DIAGNOSIS — F0391 Unspecified dementia with behavioral disturbance: Secondary | ICD-10-CM

## 2018-10-03 ENCOUNTER — Telehealth: Payer: Self-pay

## 2018-10-03 NOTE — Telephone Encounter (Signed)
Spoke to pt husband MRI is scheduled for Monday July 6th at 12 noon she needs to be there at 11:30, pt needs lab work done prior he stated that they would have it done on Wed, when they come for lab work they will pick up paper work for disability parking card. Pt husband verbalized understanding,

## 2018-10-05 ENCOUNTER — Other Ambulatory Visit (INDEPENDENT_AMBULATORY_CARE_PROVIDER_SITE_OTHER): Payer: Medicare Other

## 2018-10-05 ENCOUNTER — Other Ambulatory Visit: Payer: Self-pay

## 2018-10-05 DIAGNOSIS — F0391 Unspecified dementia with behavioral disturbance: Secondary | ICD-10-CM

## 2018-10-05 DIAGNOSIS — R269 Unspecified abnormalities of gait and mobility: Secondary | ICD-10-CM | POA: Diagnosis not present

## 2018-10-05 DIAGNOSIS — F03B18 Unspecified dementia, moderate, with other behavioral disturbance: Secondary | ICD-10-CM

## 2018-10-06 LAB — BUN: BUN: 34 mg/dL — ABNORMAL HIGH (ref 7–25)

## 2018-10-10 ENCOUNTER — Other Ambulatory Visit: Payer: Self-pay

## 2018-10-10 ENCOUNTER — Ambulatory Visit (HOSPITAL_COMMUNITY)
Admission: RE | Admit: 2018-10-10 | Discharge: 2018-10-10 | Disposition: A | Discharge status: Home / Self Care | Payer: Medicare Other | Source: Ambulatory Visit | Attending: Neurology | Admitting: Neurology

## 2018-10-10 DIAGNOSIS — R51 Headache: Secondary | ICD-10-CM | POA: Diagnosis not present

## 2018-10-10 DIAGNOSIS — R269 Unspecified abnormalities of gait and mobility: Secondary | ICD-10-CM | POA: Diagnosis not present

## 2018-10-10 DIAGNOSIS — R2681 Unsteadiness on feet: Secondary | ICD-10-CM | POA: Diagnosis not present

## 2018-10-10 LAB — POCT I-STAT CREATININE: Creatinine, Ser: 0.9 mg/dL (ref 0.44–1.00)

## 2018-10-10 MED ORDER — GADOBUTROL 1 MMOL/ML IV SOLN
5.0000 mL | Freq: Once | INTRAVENOUS | Status: AC | PRN
  Administered 2018-10-10: 13:00:00 5 mL via INTRAVENOUS

## 2018-10-11 LAB — CREATINE: Creatine, Serum: 1.7 mg/dL — ABNORMAL HIGH (ref 0.1–1.0)

## 2018-10-12 ENCOUNTER — Telehealth: Payer: Self-pay

## 2018-10-12 NOTE — Telephone Encounter (Signed)
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 An option to do a spinal tap where they would test her walking before and after to see if there is any change can be ordered if they wish since they live in [place] now. Thanks

## 2018-10-12 NOTE — Telephone Encounter (Signed)
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 It is a type of spinal tap where Physical therapy would be present during the study, they do an exam before and after the spinal tap. Thanks

## 2018-10-12 NOTE — Telephone Encounter (Signed)
Pts husband informed of MRI results. He is fine moving forward with the spinal time. He is a little unclear on why it is needed but all for the benefit of helping the pt.  Husband states that they are fine coming to [place] to have the spinal tap.  He also wanted to make [place] aware that a lawyer make be calling us in regards to medical POA.

## 2018-10-13 NOTE — Telephone Encounter (Signed)
[employer] has not ordered a high volume spinal tap yet.   Would you like for me to try and put in an order?

## 2018-10-17 ENCOUNTER — Encounter: Payer: Self-pay | Admitting: Family Medicine

## 2018-10-17 NOTE — Telephone Encounter (Signed)
Please see message and advise if refill okay?

## 2018-10-18 ENCOUNTER — Other Ambulatory Visit: Payer: Self-pay

## 2018-10-18 DIAGNOSIS — F0391 Unspecified dementia with behavioral disturbance: Secondary | ICD-10-CM

## 2018-10-18 DIAGNOSIS — F03B18 Unspecified dementia, moderate, with other behavioral disturbance: Secondary | ICD-10-CM

## 2018-10-18 DIAGNOSIS — R269 Unspecified abnormalities of gait and mobility: Secondary | ICD-10-CM

## 2018-10-18 MED ORDER — TELMISARTAN-HCTZ 40-12.5 MG PO TABS: 0.5000 | ORAL_TABLET | Freq: Every day | ORAL | 1 refills | Status: AC

## 2018-10-18 NOTE — Telephone Encounter (Signed)
Large volume LP ordered with PT to eval as part of NPH eval.

## 2018-10-20 ENCOUNTER — Other Ambulatory Visit: Payer: Self-pay | Admitting: Radiology

## 2018-10-21 ENCOUNTER — Telehealth: Payer: Self-pay | Admitting: Neurology

## 2018-10-21 ENCOUNTER — Ambulatory Visit (HOSPITAL_COMMUNITY)
Admission: RE | Admit: 2018-10-21 | Discharge: 2018-10-21 | Disposition: A | Discharge status: Home / Self Care | Payer: Medicare Other | Source: Ambulatory Visit | Attending: Neurology | Admitting: Neurology

## 2018-10-21 ENCOUNTER — Ambulatory Visit (HOSPITAL_COMMUNITY): Payer: Medicare Other

## 2018-10-21 ENCOUNTER — Other Ambulatory Visit: Payer: Self-pay

## 2018-10-21 DIAGNOSIS — F0391 Unspecified dementia with behavioral disturbance: Secondary | ICD-10-CM | POA: Diagnosis not present

## 2018-10-21 DIAGNOSIS — R269 Unspecified abnormalities of gait and mobility: Secondary | ICD-10-CM | POA: Insufficient documentation

## 2018-10-21 DIAGNOSIS — R2689 Other abnormalities of gait and mobility: Secondary | ICD-10-CM | POA: Diagnosis not present

## 2018-10-21 DIAGNOSIS — F03B18 Unspecified dementia, moderate, with other behavioral disturbance: Secondary | ICD-10-CM

## 2018-10-21 LAB — CSF CELL COUNT WITH DIFFERENTIAL
RBC Count, CSF: 3880 /mm3 — ABNORMAL HIGH
Tube #: 3
WBC, CSF: 2 /mm3 (ref 0–5)

## 2018-10-21 LAB — GLUCOSE, CSF: Glucose, CSF: 71 mg/dL — ABNORMAL HIGH (ref 40–70)

## 2018-10-21 LAB — PROTEIN, CSF: Total  Protein, CSF: 48 mg/dL — ABNORMAL HIGH (ref 15–45)

## 2018-10-21 MED ORDER — LIDOCAINE HCL (PF) 1 % IJ SOLN: 5.0000 mL | Freq: Once | INTRAMUSCULAR | Status: DC

## 2018-10-21 NOTE — Discharge Instructions (Signed)
Lumbar Puncture, Care After This sheet gives you information about how to care for yourself after your procedure. Your health care provider may also give you more specific instructions. If you have problems or questions, contact your health care provider. What can I expect after the procedure? After the procedure, it is common to have:  Mild discomfort or pain at the puncture site.  A mild headache that is relieved with pain medicines. Follow these instructions at home: Activity   Lie down flat or rest for as long as directed by your health care provider.  Return to your normal activities as told by your health care provider. Ask your health care provider what activities are safe for you.  Avoid lifting anything heavier than 10 lb (4.5 kg) for at least 12 hours after the procedure.  Do not drive for 24 hours if you were given a medicine to help you relax (sedative) during your procedure.  Do not drive or use heavy machinery while taking prescription pain medicine. Puncture site care  Remove your bandage (dressing) as told by your health care provider. In 24 hours  Check your puncture area every day for signs of infection. Check for: ? More pain. ? Redness or swelling. ? Fluid or blood leaking from the puncture site. ? Warmth. ? Pus or a bad smell. General instructions  Take over-the-counter and prescription medicines only as told by your health care provider.  Drink enough fluids to keep your urine clear or pale yellow. Your health care provider may recommend drinking caffeine to prevent a headache.  Keep all follow-up visits as told by your health care provider. This is important. Contact a health care provider if:  You have fever or chills.  You have nausea or vomiting.  You have a headache that lasts for more than 2 days or does not get better with medicine. Get help right away if:  You develop any of the following in your  legs: ? Weakness. ? Numbness. ? Tingling.  You are unable to control when you urinate or have a bowel movement (incontinence).  You have signs of infection around your puncture site, such as: ? More pain. ? Redness or swelling. ? Fluid or blood leakage. ? Warmth. ? Pus or a bad smell.  You are dizzy or you feel like you might faint.  You have a severe headache, especially when you sit or stand. Summary  A lumbar puncture is a procedure in which a small needle is inserted into the lower back to remove fluid that surrounds the brain and spinal cord.  After this procedure, it is common to have a headache and pain around the needle insertion area.  Lying flat, staying hydrated, and drinking caffeine can help prevent headaches.  Monitor your needle insertion site for signs of infection, including warmth, fluid, or more pain.  Get help right away if you develop leg weakness, leg numbness, incontinence, or severe headaches. This information is not intended to replace advice given to you by your health care provider. Make sure you discuss any questions you have with your health care provider. Document Released: 03/28/2013 Document Revised: 05/06/2016 Document Reviewed: 05/06/2016 Elsevier Patient Education  2020 [employer].

## 2018-10-21 NOTE — Telephone Encounter (Signed)
Dr. Delice Lesch,  Are you alright with me typing up a letter with our letter head stating that pt is unable to manage her benefits due to her medical condition?

## 2018-10-21 NOTE — Procedures (Signed)
Findings: Opening pressure: 10 DL/O3R Complications: None Level: L3/L4   Lovey Newcomer, MD

## 2018-10-21 NOTE — Telephone Encounter (Signed)
New Message  Higinio Plan from [employer] is needing a letter patient is unable to handle her benefits of social security.  Higinio Plan verbalized we can do a verbal order but fax is more accessible.  Attn to him.  [phone] (fax)

## 2018-10-26 NOTE — Telephone Encounter (Signed)
Pls confirm with husband that he is okay with [place] sending a letter to this person. Thanks

## 2018-10-26 NOTE — Telephone Encounter (Signed)
Called spoke with spouse [name] (on [place]) he states that this letter is ok to send. He is aware that Mr. [name] was requesting this letter.  Medicare requires because patient request a  change of address. Pt wasn't able to communicate this information. They heard someone helping her in the back ground "spouse" they then requested a letter from medical provider that patient isn't able to handle her SS benefits.   Pt spouse will be managing patient SS benefits   Ok to send letter

## 2018-10-26 NOTE — Telephone Encounter (Signed)
Letter created Printed for provider to sign

## 2018-11-07 ENCOUNTER — Encounter: Payer: Self-pay | Admitting: Neurology

## 2018-11-08 ENCOUNTER — Telehealth: Payer: Self-pay

## 2018-11-08 NOTE — Telephone Encounter (Signed)
Per Dr. Delice Lesch and at pts request faxed letter of care, ov notes, recent labs and imaging. Attention Dr. Fabio Bering  Fax# [phone]-- [place] office

## 2018-11-10 ENCOUNTER — Telehealth: Payer: Self-pay

## 2018-11-10 NOTE — Telephone Encounter (Signed)
Pt has an appt scheduled for 11/24/18 at 2:30

## 2018-11-24 DIAGNOSIS — G919 Hydrocephalus, unspecified: Secondary | ICD-10-CM | POA: Diagnosis not present

## 2018-11-26 ENCOUNTER — Encounter: Payer: Self-pay | Admitting: Family Medicine

## 2018-11-26 DIAGNOSIS — Z23 Encounter for immunization: Secondary | ICD-10-CM | POA: Diagnosis not present

## 2018-11-30 ENCOUNTER — Encounter: Payer: Self-pay | Admitting: Family Medicine

## 2018-12-14 DIAGNOSIS — I10 Essential (primary) hypertension: Secondary | ICD-10-CM | POA: Diagnosis not present

## 2018-12-14 DIAGNOSIS — F0390 Unspecified dementia without behavioral disturbance: Secondary | ICD-10-CM | POA: Diagnosis not present

## 2018-12-14 DIAGNOSIS — H409 Unspecified glaucoma: Secondary | ICD-10-CM | POA: Diagnosis not present

## 2018-12-14 DIAGNOSIS — E7800 Pure hypercholesterolemia, unspecified: Secondary | ICD-10-CM | POA: Diagnosis not present

## 2018-12-14 DIAGNOSIS — F411 Generalized anxiety disorder: Secondary | ICD-10-CM | POA: Diagnosis not present

## 2018-12-16 ENCOUNTER — Ambulatory Visit: Payer: Medicare Other | Admitting: Neurology

## 2018-12-16 DIAGNOSIS — Z1159 Encounter for screening for other viral diseases: Secondary | ICD-10-CM | POA: Diagnosis not present

## 2018-12-19 ENCOUNTER — Telehealth: Payer: Self-pay | Admitting: Neurology

## 2018-12-19 NOTE — Telephone Encounter (Signed)
Ivin Booty, RN from [facility] was needing the last two office visit notes for patient faxed to [phone].

## 2018-12-19 NOTE — Telephone Encounter (Signed)
Last to office notes faxed.

## 2018-12-21 DIAGNOSIS — G0490 Encephalitis and encephalomyelitis, unspecified: Secondary | ICD-10-CM | POA: Diagnosis not present

## 2018-12-21 DIAGNOSIS — G9389 Other specified disorders of brain: Secondary | ICD-10-CM | POA: Diagnosis present

## 2018-12-21 DIAGNOSIS — I10 Essential (primary) hypertension: Secondary | ICD-10-CM | POA: Diagnosis present

## 2018-12-21 DIAGNOSIS — G912 (Idiopathic) normal pressure hydrocephalus: Secondary | ICD-10-CM | POA: Diagnosis present

## 2018-12-21 DIAGNOSIS — F0390 Unspecified dementia without behavioral disturbance: Secondary | ICD-10-CM | POA: Diagnosis present

## 2018-12-21 DIAGNOSIS — G919 Hydrocephalus, unspecified: Secondary | ICD-10-CM | POA: Diagnosis not present

## 2018-12-21 DIAGNOSIS — Z20828 Contact with and (suspected) exposure to other viral communicable diseases: Secondary | ICD-10-CM | POA: Diagnosis present

## 2018-12-24 DIAGNOSIS — F419 Anxiety disorder, unspecified: Secondary | ICD-10-CM | POA: Diagnosis not present

## 2018-12-24 DIAGNOSIS — F0390 Unspecified dementia without behavioral disturbance: Secondary | ICD-10-CM | POA: Diagnosis not present

## 2018-12-24 DIAGNOSIS — H409 Unspecified glaucoma: Secondary | ICD-10-CM | POA: Diagnosis not present

## 2018-12-24 DIAGNOSIS — E7800 Pure hypercholesterolemia, unspecified: Secondary | ICD-10-CM | POA: Diagnosis not present

## 2018-12-24 DIAGNOSIS — I10 Essential (primary) hypertension: Secondary | ICD-10-CM | POA: Diagnosis not present

## 2018-12-24 DIAGNOSIS — Z9181 History of falling: Secondary | ICD-10-CM | POA: Diagnosis not present

## 2018-12-24 DIAGNOSIS — Z4889 Encounter for other specified surgical aftercare: Secondary | ICD-10-CM | POA: Diagnosis not present

## 2018-12-28 DIAGNOSIS — F419 Anxiety disorder, unspecified: Secondary | ICD-10-CM | POA: Diagnosis not present

## 2018-12-28 DIAGNOSIS — Z4889 Encounter for other specified surgical aftercare: Secondary | ICD-10-CM | POA: Diagnosis not present

## 2018-12-28 DIAGNOSIS — F0390 Unspecified dementia without behavioral disturbance: Secondary | ICD-10-CM | POA: Diagnosis not present

## 2018-12-28 DIAGNOSIS — E7800 Pure hypercholesterolemia, unspecified: Secondary | ICD-10-CM | POA: Diagnosis not present

## 2018-12-28 DIAGNOSIS — I10 Essential (primary) hypertension: Secondary | ICD-10-CM | POA: Diagnosis not present

## 2018-12-28 DIAGNOSIS — H409 Unspecified glaucoma: Secondary | ICD-10-CM | POA: Diagnosis not present

## 2018-12-29 DIAGNOSIS — I10 Essential (primary) hypertension: Secondary | ICD-10-CM | POA: Diagnosis not present

## 2018-12-29 DIAGNOSIS — Z4889 Encounter for other specified surgical aftercare: Secondary | ICD-10-CM | POA: Diagnosis not present

## 2018-12-29 DIAGNOSIS — H409 Unspecified glaucoma: Secondary | ICD-10-CM | POA: Diagnosis not present

## 2018-12-29 DIAGNOSIS — F419 Anxiety disorder, unspecified: Secondary | ICD-10-CM | POA: Diagnosis not present

## 2018-12-29 DIAGNOSIS — F0390 Unspecified dementia without behavioral disturbance: Secondary | ICD-10-CM | POA: Diagnosis not present

## 2018-12-29 DIAGNOSIS — E7800 Pure hypercholesterolemia, unspecified: Secondary | ICD-10-CM | POA: Diagnosis not present

## 2018-12-30 DIAGNOSIS — F0390 Unspecified dementia without behavioral disturbance: Secondary | ICD-10-CM | POA: Diagnosis not present

## 2018-12-30 DIAGNOSIS — I10 Essential (primary) hypertension: Secondary | ICD-10-CM | POA: Diagnosis not present

## 2018-12-30 DIAGNOSIS — H409 Unspecified glaucoma: Secondary | ICD-10-CM | POA: Diagnosis not present

## 2018-12-30 DIAGNOSIS — F419 Anxiety disorder, unspecified: Secondary | ICD-10-CM | POA: Diagnosis not present

## 2018-12-30 DIAGNOSIS — Z4889 Encounter for other specified surgical aftercare: Secondary | ICD-10-CM | POA: Diagnosis not present

## 2018-12-30 DIAGNOSIS — E7800 Pure hypercholesterolemia, unspecified: Secondary | ICD-10-CM | POA: Diagnosis not present

## 2019-01-02 DIAGNOSIS — Z4889 Encounter for other specified surgical aftercare: Secondary | ICD-10-CM | POA: Diagnosis not present

## 2019-01-02 DIAGNOSIS — F0390 Unspecified dementia without behavioral disturbance: Secondary | ICD-10-CM | POA: Diagnosis not present

## 2019-01-02 DIAGNOSIS — H409 Unspecified glaucoma: Secondary | ICD-10-CM | POA: Diagnosis not present

## 2019-01-02 DIAGNOSIS — F419 Anxiety disorder, unspecified: Secondary | ICD-10-CM | POA: Diagnosis not present

## 2019-01-02 DIAGNOSIS — I10 Essential (primary) hypertension: Secondary | ICD-10-CM | POA: Diagnosis not present

## 2019-01-02 DIAGNOSIS — E7800 Pure hypercholesterolemia, unspecified: Secondary | ICD-10-CM | POA: Diagnosis not present

## 2019-01-03 DIAGNOSIS — E7800 Pure hypercholesterolemia, unspecified: Secondary | ICD-10-CM | POA: Diagnosis not present

## 2019-01-03 DIAGNOSIS — F419 Anxiety disorder, unspecified: Secondary | ICD-10-CM | POA: Diagnosis not present

## 2019-01-03 DIAGNOSIS — Z4889 Encounter for other specified surgical aftercare: Secondary | ICD-10-CM | POA: Diagnosis not present

## 2019-01-03 DIAGNOSIS — I10 Essential (primary) hypertension: Secondary | ICD-10-CM | POA: Diagnosis not present

## 2019-01-03 DIAGNOSIS — H409 Unspecified glaucoma: Secondary | ICD-10-CM | POA: Diagnosis not present

## 2019-01-03 DIAGNOSIS — F0390 Unspecified dementia without behavioral disturbance: Secondary | ICD-10-CM | POA: Diagnosis not present

## 2019-01-05 DIAGNOSIS — F419 Anxiety disorder, unspecified: Secondary | ICD-10-CM | POA: Diagnosis not present

## 2019-01-05 DIAGNOSIS — I10 Essential (primary) hypertension: Secondary | ICD-10-CM | POA: Diagnosis not present

## 2019-01-05 DIAGNOSIS — E7800 Pure hypercholesterolemia, unspecified: Secondary | ICD-10-CM | POA: Diagnosis not present

## 2019-01-05 DIAGNOSIS — Z4889 Encounter for other specified surgical aftercare: Secondary | ICD-10-CM | POA: Diagnosis not present

## 2019-01-05 DIAGNOSIS — F0390 Unspecified dementia without behavioral disturbance: Secondary | ICD-10-CM | POA: Diagnosis not present

## 2019-01-05 DIAGNOSIS — H409 Unspecified glaucoma: Secondary | ICD-10-CM | POA: Diagnosis not present

## 2019-01-06 DIAGNOSIS — I10 Essential (primary) hypertension: Secondary | ICD-10-CM | POA: Diagnosis not present

## 2019-01-06 DIAGNOSIS — Z4889 Encounter for other specified surgical aftercare: Secondary | ICD-10-CM | POA: Diagnosis not present

## 2019-01-06 DIAGNOSIS — F0390 Unspecified dementia without behavioral disturbance: Secondary | ICD-10-CM | POA: Diagnosis not present

## 2019-01-06 DIAGNOSIS — E7800 Pure hypercholesterolemia, unspecified: Secondary | ICD-10-CM | POA: Diagnosis not present

## 2019-01-06 DIAGNOSIS — F419 Anxiety disorder, unspecified: Secondary | ICD-10-CM | POA: Diagnosis not present

## 2019-01-06 DIAGNOSIS — H409 Unspecified glaucoma: Secondary | ICD-10-CM | POA: Diagnosis not present

## 2019-01-09 DIAGNOSIS — I10 Essential (primary) hypertension: Secondary | ICD-10-CM | POA: Diagnosis not present

## 2019-01-09 DIAGNOSIS — H409 Unspecified glaucoma: Secondary | ICD-10-CM | POA: Diagnosis not present

## 2019-01-09 DIAGNOSIS — F0390 Unspecified dementia without behavioral disturbance: Secondary | ICD-10-CM | POA: Diagnosis not present

## 2019-01-09 DIAGNOSIS — F419 Anxiety disorder, unspecified: Secondary | ICD-10-CM | POA: Diagnosis not present

## 2019-01-09 DIAGNOSIS — Z4889 Encounter for other specified surgical aftercare: Secondary | ICD-10-CM | POA: Diagnosis not present

## 2019-01-09 DIAGNOSIS — E7800 Pure hypercholesterolemia, unspecified: Secondary | ICD-10-CM | POA: Diagnosis not present

## 2019-01-10 DIAGNOSIS — F0390 Unspecified dementia without behavioral disturbance: Secondary | ICD-10-CM | POA: Diagnosis not present

## 2019-01-10 DIAGNOSIS — E7800 Pure hypercholesterolemia, unspecified: Secondary | ICD-10-CM | POA: Diagnosis not present

## 2019-01-10 DIAGNOSIS — F419 Anxiety disorder, unspecified: Secondary | ICD-10-CM | POA: Diagnosis not present

## 2019-01-10 DIAGNOSIS — I10 Essential (primary) hypertension: Secondary | ICD-10-CM | POA: Diagnosis not present

## 2019-01-10 DIAGNOSIS — Z4889 Encounter for other specified surgical aftercare: Secondary | ICD-10-CM | POA: Diagnosis not present

## 2019-01-10 DIAGNOSIS — H409 Unspecified glaucoma: Secondary | ICD-10-CM | POA: Diagnosis not present

## 2019-01-11 DIAGNOSIS — Z4889 Encounter for other specified surgical aftercare: Secondary | ICD-10-CM | POA: Diagnosis not present

## 2019-01-11 DIAGNOSIS — F0390 Unspecified dementia without behavioral disturbance: Secondary | ICD-10-CM | POA: Diagnosis not present

## 2019-01-11 DIAGNOSIS — E7800 Pure hypercholesterolemia, unspecified: Secondary | ICD-10-CM | POA: Diagnosis not present

## 2019-01-11 DIAGNOSIS — H409 Unspecified glaucoma: Secondary | ICD-10-CM | POA: Diagnosis not present

## 2019-01-11 DIAGNOSIS — F419 Anxiety disorder, unspecified: Secondary | ICD-10-CM | POA: Diagnosis not present

## 2019-01-11 DIAGNOSIS — I10 Essential (primary) hypertension: Secondary | ICD-10-CM | POA: Diagnosis not present

## 2019-01-12 DIAGNOSIS — H409 Unspecified glaucoma: Secondary | ICD-10-CM | POA: Diagnosis not present

## 2019-01-12 DIAGNOSIS — F0390 Unspecified dementia without behavioral disturbance: Secondary | ICD-10-CM | POA: Diagnosis not present

## 2019-01-12 DIAGNOSIS — F419 Anxiety disorder, unspecified: Secondary | ICD-10-CM | POA: Diagnosis not present

## 2019-01-12 DIAGNOSIS — I10 Essential (primary) hypertension: Secondary | ICD-10-CM | POA: Diagnosis not present

## 2019-01-12 DIAGNOSIS — E7800 Pure hypercholesterolemia, unspecified: Secondary | ICD-10-CM | POA: Diagnosis not present

## 2019-01-12 DIAGNOSIS — Z4889 Encounter for other specified surgical aftercare: Secondary | ICD-10-CM | POA: Diagnosis not present

## 2019-01-13 DIAGNOSIS — Z4889 Encounter for other specified surgical aftercare: Secondary | ICD-10-CM | POA: Diagnosis not present

## 2019-01-13 DIAGNOSIS — H409 Unspecified glaucoma: Secondary | ICD-10-CM | POA: Diagnosis not present

## 2019-01-13 DIAGNOSIS — I10 Essential (primary) hypertension: Secondary | ICD-10-CM | POA: Diagnosis not present

## 2019-01-13 DIAGNOSIS — F0390 Unspecified dementia without behavioral disturbance: Secondary | ICD-10-CM | POA: Diagnosis not present

## 2019-01-13 DIAGNOSIS — E7800 Pure hypercholesterolemia, unspecified: Secondary | ICD-10-CM | POA: Diagnosis not present

## 2019-01-13 DIAGNOSIS — F419 Anxiety disorder, unspecified: Secondary | ICD-10-CM | POA: Diagnosis not present

## 2019-01-17 DIAGNOSIS — E7800 Pure hypercholesterolemia, unspecified: Secondary | ICD-10-CM | POA: Diagnosis not present

## 2019-01-17 DIAGNOSIS — Z4889 Encounter for other specified surgical aftercare: Secondary | ICD-10-CM | POA: Diagnosis not present

## 2019-01-17 DIAGNOSIS — I10 Essential (primary) hypertension: Secondary | ICD-10-CM | POA: Diagnosis not present

## 2019-01-17 DIAGNOSIS — F419 Anxiety disorder, unspecified: Secondary | ICD-10-CM | POA: Diagnosis not present

## 2019-01-17 DIAGNOSIS — H409 Unspecified glaucoma: Secondary | ICD-10-CM | POA: Diagnosis not present

## 2019-01-17 DIAGNOSIS — F0390 Unspecified dementia without behavioral disturbance: Secondary | ICD-10-CM | POA: Diagnosis not present

## 2019-01-18 DIAGNOSIS — E7800 Pure hypercholesterolemia, unspecified: Secondary | ICD-10-CM | POA: Diagnosis not present

## 2019-01-18 DIAGNOSIS — I10 Essential (primary) hypertension: Secondary | ICD-10-CM | POA: Diagnosis not present

## 2019-01-18 DIAGNOSIS — F0390 Unspecified dementia without behavioral disturbance: Secondary | ICD-10-CM | POA: Diagnosis not present

## 2019-01-18 DIAGNOSIS — F419 Anxiety disorder, unspecified: Secondary | ICD-10-CM | POA: Diagnosis not present

## 2019-01-18 DIAGNOSIS — H409 Unspecified glaucoma: Secondary | ICD-10-CM | POA: Diagnosis not present

## 2019-01-18 DIAGNOSIS — Z4889 Encounter for other specified surgical aftercare: Secondary | ICD-10-CM | POA: Diagnosis not present

## 2019-01-19 DIAGNOSIS — E7800 Pure hypercholesterolemia, unspecified: Secondary | ICD-10-CM | POA: Diagnosis not present

## 2019-01-19 DIAGNOSIS — F419 Anxiety disorder, unspecified: Secondary | ICD-10-CM | POA: Diagnosis not present

## 2019-01-19 DIAGNOSIS — I10 Essential (primary) hypertension: Secondary | ICD-10-CM | POA: Diagnosis not present

## 2019-01-19 DIAGNOSIS — H409 Unspecified glaucoma: Secondary | ICD-10-CM | POA: Diagnosis not present

## 2019-01-19 DIAGNOSIS — Z4889 Encounter for other specified surgical aftercare: Secondary | ICD-10-CM | POA: Diagnosis not present

## 2019-01-19 DIAGNOSIS — F0390 Unspecified dementia without behavioral disturbance: Secondary | ICD-10-CM | POA: Diagnosis not present

## 2019-01-20 DIAGNOSIS — E7800 Pure hypercholesterolemia, unspecified: Secondary | ICD-10-CM | POA: Diagnosis not present

## 2019-01-20 DIAGNOSIS — F0390 Unspecified dementia without behavioral disturbance: Secondary | ICD-10-CM | POA: Diagnosis not present

## 2019-01-20 DIAGNOSIS — F419 Anxiety disorder, unspecified: Secondary | ICD-10-CM | POA: Diagnosis not present

## 2019-01-20 DIAGNOSIS — H409 Unspecified glaucoma: Secondary | ICD-10-CM | POA: Diagnosis not present

## 2019-01-20 DIAGNOSIS — Z4889 Encounter for other specified surgical aftercare: Secondary | ICD-10-CM | POA: Diagnosis not present

## 2019-01-20 DIAGNOSIS — I10 Essential (primary) hypertension: Secondary | ICD-10-CM | POA: Diagnosis not present

## 2019-01-23 DIAGNOSIS — I10 Essential (primary) hypertension: Secondary | ICD-10-CM | POA: Diagnosis not present

## 2019-01-23 DIAGNOSIS — F419 Anxiety disorder, unspecified: Secondary | ICD-10-CM | POA: Diagnosis not present

## 2019-01-23 DIAGNOSIS — Z4889 Encounter for other specified surgical aftercare: Secondary | ICD-10-CM | POA: Diagnosis not present

## 2019-01-23 DIAGNOSIS — F0390 Unspecified dementia without behavioral disturbance: Secondary | ICD-10-CM | POA: Diagnosis not present

## 2019-01-23 DIAGNOSIS — Z961 Presence of intraocular lens: Secondary | ICD-10-CM | POA: Diagnosis not present

## 2019-01-23 DIAGNOSIS — Z9181 History of falling: Secondary | ICD-10-CM | POA: Diagnosis not present

## 2019-01-23 DIAGNOSIS — H409 Unspecified glaucoma: Secondary | ICD-10-CM | POA: Diagnosis not present

## 2019-01-23 DIAGNOSIS — H401132 Primary open-angle glaucoma, bilateral, moderate stage: Secondary | ICD-10-CM | POA: Diagnosis not present

## 2019-01-23 DIAGNOSIS — E7800 Pure hypercholesterolemia, unspecified: Secondary | ICD-10-CM | POA: Diagnosis not present

## 2019-01-24 DIAGNOSIS — F419 Anxiety disorder, unspecified: Secondary | ICD-10-CM | POA: Diagnosis not present

## 2019-01-24 DIAGNOSIS — E7800 Pure hypercholesterolemia, unspecified: Secondary | ICD-10-CM | POA: Diagnosis not present

## 2019-01-24 DIAGNOSIS — F0390 Unspecified dementia without behavioral disturbance: Secondary | ICD-10-CM | POA: Diagnosis not present

## 2019-01-24 DIAGNOSIS — I10 Essential (primary) hypertension: Secondary | ICD-10-CM | POA: Diagnosis not present

## 2019-01-24 DIAGNOSIS — Z4889 Encounter for other specified surgical aftercare: Secondary | ICD-10-CM | POA: Diagnosis not present

## 2019-01-24 DIAGNOSIS — H409 Unspecified glaucoma: Secondary | ICD-10-CM | POA: Diagnosis not present

## 2019-01-26 DIAGNOSIS — Z4889 Encounter for other specified surgical aftercare: Secondary | ICD-10-CM | POA: Diagnosis not present

## 2019-01-26 DIAGNOSIS — F419 Anxiety disorder, unspecified: Secondary | ICD-10-CM | POA: Diagnosis not present

## 2019-01-26 DIAGNOSIS — E7800 Pure hypercholesterolemia, unspecified: Secondary | ICD-10-CM | POA: Diagnosis not present

## 2019-01-26 DIAGNOSIS — I10 Essential (primary) hypertension: Secondary | ICD-10-CM | POA: Diagnosis not present

## 2019-01-26 DIAGNOSIS — H409 Unspecified glaucoma: Secondary | ICD-10-CM | POA: Diagnosis not present

## 2019-01-26 DIAGNOSIS — F0390 Unspecified dementia without behavioral disturbance: Secondary | ICD-10-CM | POA: Diagnosis not present

## 2019-01-27 DIAGNOSIS — E7800 Pure hypercholesterolemia, unspecified: Secondary | ICD-10-CM | POA: Diagnosis not present

## 2019-01-27 DIAGNOSIS — F0390 Unspecified dementia without behavioral disturbance: Secondary | ICD-10-CM | POA: Diagnosis not present

## 2019-01-27 DIAGNOSIS — Z4889 Encounter for other specified surgical aftercare: Secondary | ICD-10-CM | POA: Diagnosis not present

## 2019-01-27 DIAGNOSIS — I10 Essential (primary) hypertension: Secondary | ICD-10-CM | POA: Diagnosis not present

## 2019-01-27 DIAGNOSIS — H409 Unspecified glaucoma: Secondary | ICD-10-CM | POA: Diagnosis not present

## 2019-01-27 DIAGNOSIS — F419 Anxiety disorder, unspecified: Secondary | ICD-10-CM | POA: Diagnosis not present

## 2019-01-31 DIAGNOSIS — F419 Anxiety disorder, unspecified: Secondary | ICD-10-CM | POA: Diagnosis not present

## 2019-01-31 DIAGNOSIS — H409 Unspecified glaucoma: Secondary | ICD-10-CM | POA: Diagnosis not present

## 2019-01-31 DIAGNOSIS — E7800 Pure hypercholesterolemia, unspecified: Secondary | ICD-10-CM | POA: Diagnosis not present

## 2019-01-31 DIAGNOSIS — I10 Essential (primary) hypertension: Secondary | ICD-10-CM | POA: Diagnosis not present

## 2019-01-31 DIAGNOSIS — F0390 Unspecified dementia without behavioral disturbance: Secondary | ICD-10-CM | POA: Diagnosis not present

## 2019-01-31 DIAGNOSIS — Z4889 Encounter for other specified surgical aftercare: Secondary | ICD-10-CM | POA: Diagnosis not present

## 2019-02-02 DIAGNOSIS — F0390 Unspecified dementia without behavioral disturbance: Secondary | ICD-10-CM | POA: Diagnosis not present

## 2019-02-02 DIAGNOSIS — G919 Hydrocephalus, unspecified: Secondary | ICD-10-CM | POA: Diagnosis not present

## 2019-02-02 DIAGNOSIS — Z982 Presence of cerebrospinal fluid drainage device: Secondary | ICD-10-CM | POA: Diagnosis not present

## 2019-02-02 DIAGNOSIS — R2681 Unsteadiness on feet: Secondary | ICD-10-CM | POA: Diagnosis not present

## 2019-02-02 DIAGNOSIS — R2689 Other abnormalities of gait and mobility: Secondary | ICD-10-CM | POA: Diagnosis not present

## 2019-02-07 DIAGNOSIS — R2689 Other abnormalities of gait and mobility: Secondary | ICD-10-CM | POA: Diagnosis not present

## 2019-02-07 DIAGNOSIS — R2681 Unsteadiness on feet: Secondary | ICD-10-CM | POA: Diagnosis not present

## 2019-02-07 DIAGNOSIS — Z982 Presence of cerebrospinal fluid drainage device: Secondary | ICD-10-CM | POA: Diagnosis not present

## 2019-02-07 DIAGNOSIS — F0390 Unspecified dementia without behavioral disturbance: Secondary | ICD-10-CM | POA: Diagnosis not present

## 2019-02-07 DIAGNOSIS — G919 Hydrocephalus, unspecified: Secondary | ICD-10-CM | POA: Diagnosis not present

## 2019-02-13 DIAGNOSIS — G919 Hydrocephalus, unspecified: Secondary | ICD-10-CM | POA: Diagnosis not present

## 2019-02-13 DIAGNOSIS — I10 Essential (primary) hypertension: Secondary | ICD-10-CM | POA: Diagnosis not present

## 2019-02-15 DIAGNOSIS — Z982 Presence of cerebrospinal fluid drainage device: Secondary | ICD-10-CM | POA: Diagnosis not present

## 2019-02-15 DIAGNOSIS — G919 Hydrocephalus, unspecified: Secondary | ICD-10-CM | POA: Diagnosis not present

## 2019-02-15 DIAGNOSIS — R2681 Unsteadiness on feet: Secondary | ICD-10-CM | POA: Diagnosis not present

## 2019-02-15 DIAGNOSIS — R2689 Other abnormalities of gait and mobility: Secondary | ICD-10-CM | POA: Diagnosis not present

## 2019-02-15 DIAGNOSIS — F0390 Unspecified dementia without behavioral disturbance: Secondary | ICD-10-CM | POA: Diagnosis not present

## 2019-03-06 DIAGNOSIS — M542 Cervicalgia: Secondary | ICD-10-CM | POA: Diagnosis not present

## 2019-03-27 ENCOUNTER — Telehealth: Payer: Self-pay | Admitting: Family Medicine

## 2019-03-27 NOTE — Telephone Encounter (Signed)
I called the patient to schedule AWV with Loma Sousa.  I spoke with Georgina Pillion who informed me that they have moved to [place] and are working with new doctors.

## 2019-03-27 NOTE — Telephone Encounter (Signed)
FYI

## 2019-03-27 NOTE — Telephone Encounter (Signed)
PCP needs to be changed when this occurs. Should be changed by wellness team and can still send to [place]. We need to know for our patient panels/metrics. Let me know if you need help making this change.

## 2019-03-28 NOTE — Telephone Encounter (Signed)
Pcp removed

## 2019-04-25 DIAGNOSIS — G919 Hydrocephalus, unspecified: Secondary | ICD-10-CM | POA: Diagnosis not present

## 2019-04-27 NOTE — Telephone Encounter (Signed)
Referral sent. Notes, demos, insurance info, imaging and labs faxed to [phone]

## 2019-05-10 DIAGNOSIS — H401132 Primary open-angle glaucoma, bilateral, moderate stage: Secondary | ICD-10-CM | POA: Diagnosis not present

## 2019-06-01 DIAGNOSIS — Z7189 Other specified counseling: Secondary | ICD-10-CM | POA: Diagnosis not present

## 2019-06-01 DIAGNOSIS — G919 Hydrocephalus, unspecified: Secondary | ICD-10-CM | POA: Diagnosis not present

## 2019-06-06 DIAGNOSIS — I10 Essential (primary) hypertension: Secondary | ICD-10-CM | POA: Diagnosis not present

## 2019-09-25 IMAGING — RF DG SPINAL PUNCT LUMBAR DIAG WITH FL CT GUIDANCE
3 series · 3 of 3 positions shown · non-contrast
Comparison: none

CLINICAL DATA: Concern for normal pressure hydrocephalus. High
volume lumbar spinal tap.

[Series 1: fluoro_iodine 2fps_bw · 0.20mm/px · 1 of 1 slices shown]
[im 1/1]
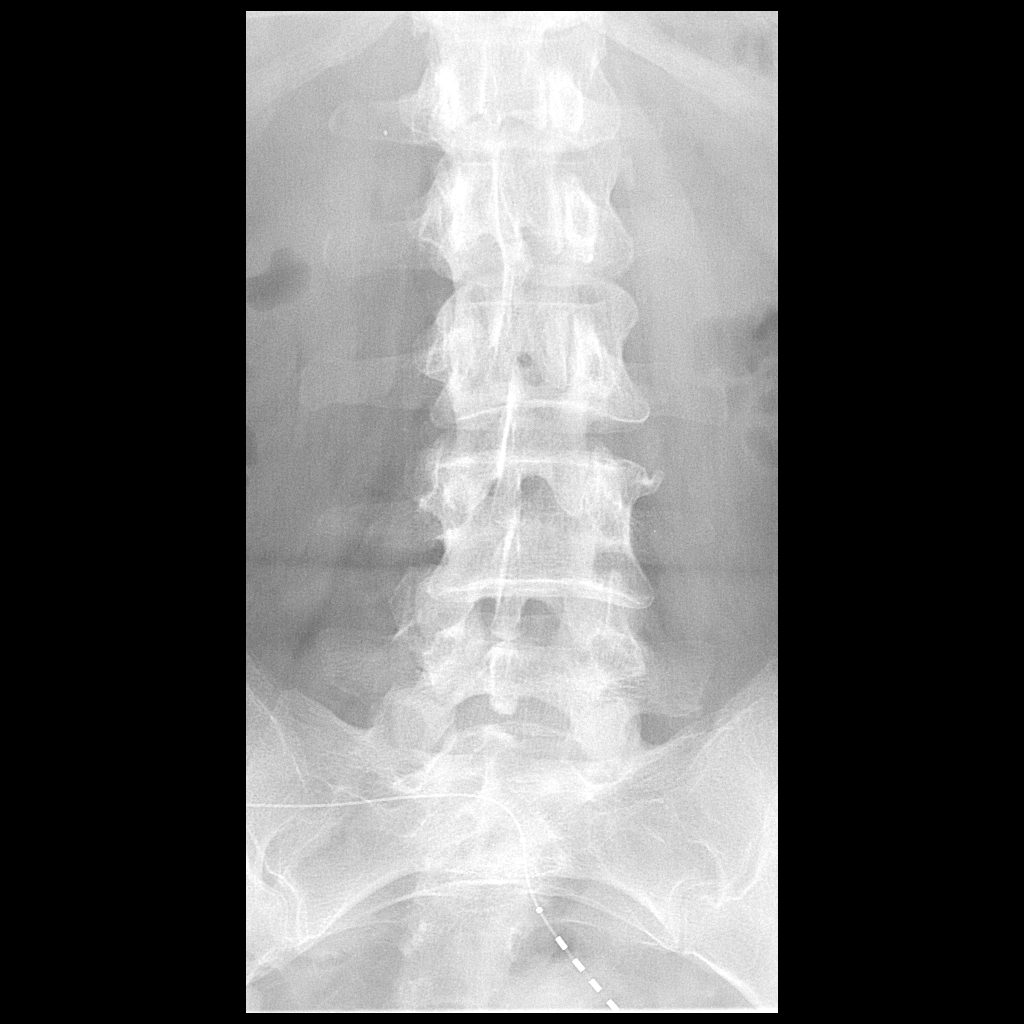

[Series 2: cp_standard · 0.19mm/px · 1 of 1 slices shown (1 of 2)]
[im 1/1]
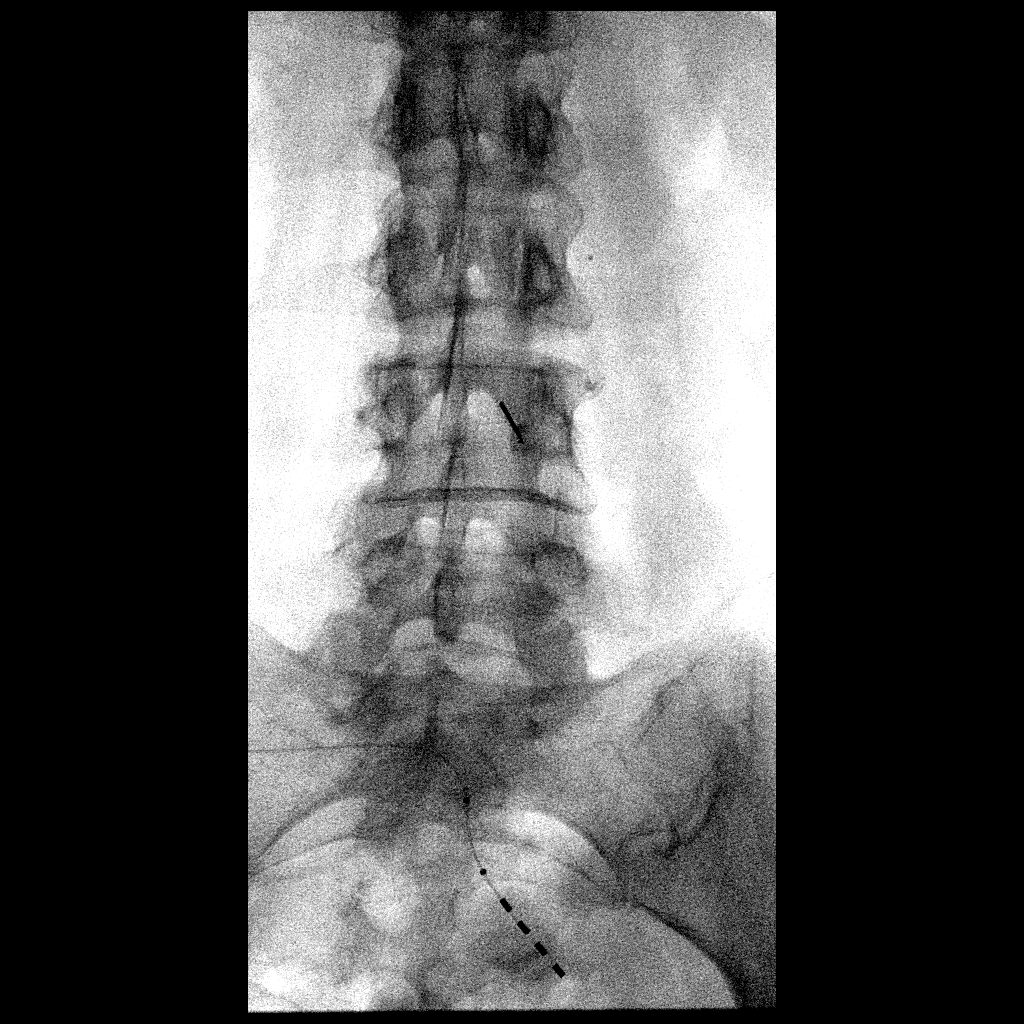

[Series 3: cp_standard · 0.20mm/px · 1 of 1 slices shown (2 of 2)]
[im 1/1]
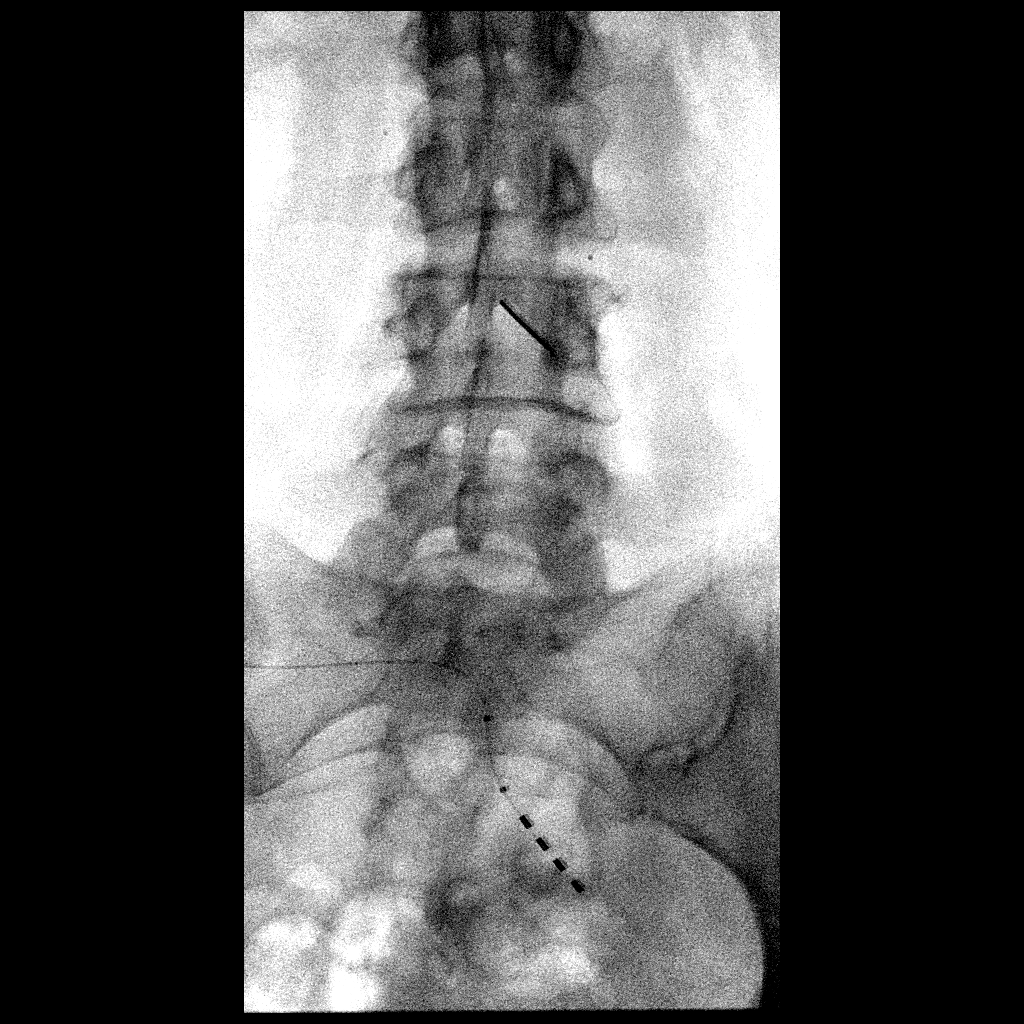

[3 of 3 positions shown; findings below may reference images not displayed]

EXAM:
DIAGNOSTIC LUMBAR PUNCTURE UNDER FLUOROSCOPIC GUIDANCE

FLUOROSCOPY TIME:  Fluoroscopy Time:  24 seconds

PROCEDURE:
Informed consent was obtained from the patient prior to the
procedure, including potential complications of headache, allergy,
and pain. With the patient prone, the lower back was prepped with
Betadine. 1% Lidocaine was used for local anesthesia. Lumbar
puncture was performed at the L3-4 level using a 20 gauge needle
with return of clear CSF with an opening pressure of 10 cm water.
Twenty-eight ml of CSF were obtained for laboratory studies. The
patient tolerated the procedure well and there were no apparent
complications.
IMPRESSION: High-volume lumbar puncture without immediate complication.

## 2020-10-28 ENCOUNTER — Encounter: Payer: Self-pay | Admitting: Gastroenterology

## 2022-11-05 DEATH — deceased
